# Patient Record
Sex: Male | Born: 1950
Health system: Southern US, Community
[De-identification: ages and names within clinical notes are randomized; demographics above are authoritative.]

## PROBLEM LIST (undated history)

## (undated) DIAGNOSIS — Z8619 Personal history of other infectious and parasitic diseases: Secondary | ICD-10-CM

## (undated) DIAGNOSIS — I739 Peripheral vascular disease, unspecified: Secondary | ICD-10-CM

## (undated) DIAGNOSIS — E669 Obesity, unspecified: Secondary | ICD-10-CM

## (undated) DIAGNOSIS — I10 Essential (primary) hypertension: Secondary | ICD-10-CM

## (undated) DIAGNOSIS — R7989 Other specified abnormal findings of blood chemistry: Secondary | ICD-10-CM

## (undated) DIAGNOSIS — M1990 Unspecified osteoarthritis, unspecified site: Secondary | ICD-10-CM

## (undated) HISTORY — DX: Other specified abnormal findings of blood chemistry: R79.89

## (undated) HISTORY — PX: FEMORAL-POPLITEAL BYPASS GRAFT: SHX937

## (undated) HISTORY — DX: Personal history of other infectious and parasitic diseases: Z86.19

## (undated) HISTORY — DX: Unspecified osteoarthritis, unspecified site: M19.90

## (undated) HISTORY — DX: Peripheral vascular disease, unspecified: I73.9

## (undated) HISTORY — PX: CIRCUMCISION: SUR203

## (undated) HISTORY — DX: Essential (primary) hypertension: I10

## (undated) HISTORY — PX: OTHER SURGICAL HISTORY: SHX169

---

## 2007-01-10 ENCOUNTER — Ambulatory Visit: Payer: Self-pay | Admitting: Gastroenterology

## 2007-09-02 ENCOUNTER — Ambulatory Visit: Payer: Self-pay | Admitting: Internal Medicine

## 2007-09-08 ENCOUNTER — Ambulatory Visit: Payer: Self-pay | Admitting: Oncology

## 2007-09-29 ENCOUNTER — Ambulatory Visit: Payer: Self-pay | Admitting: Oncology

## 2007-10-09 ENCOUNTER — Ambulatory Visit: Payer: Self-pay | Admitting: Oncology

## 2007-10-21 ENCOUNTER — Ambulatory Visit: Payer: Self-pay | Admitting: Rheumatology

## 2007-11-09 ENCOUNTER — Ambulatory Visit: Payer: Self-pay | Admitting: Oncology

## 2008-01-08 ENCOUNTER — Ambulatory Visit: Payer: Self-pay | Admitting: Oncology

## 2008-02-06 ENCOUNTER — Ambulatory Visit: Payer: Self-pay | Admitting: Oncology

## 2008-04-07 ENCOUNTER — Ambulatory Visit: Payer: Self-pay | Admitting: Oncology

## 2008-04-19 ENCOUNTER — Ambulatory Visit: Payer: Self-pay | Admitting: Oncology

## 2008-05-08 ENCOUNTER — Ambulatory Visit: Payer: Self-pay | Admitting: Oncology

## 2008-08-08 ENCOUNTER — Ambulatory Visit: Payer: Self-pay | Admitting: Oncology

## 2008-09-07 ENCOUNTER — Ambulatory Visit: Payer: Self-pay | Admitting: Oncology

## 2009-03-08 ENCOUNTER — Ambulatory Visit: Payer: Self-pay | Admitting: Oncology

## 2009-03-09 ENCOUNTER — Ambulatory Visit: Payer: Self-pay | Admitting: Oncology

## 2009-04-07 ENCOUNTER — Ambulatory Visit: Payer: Self-pay | Admitting: Oncology

## 2013-06-16 ENCOUNTER — Ambulatory Visit (INDEPENDENT_AMBULATORY_CARE_PROVIDER_SITE_OTHER): Payer: BC Managed Care – PPO | Admitting: Internal Medicine

## 2013-06-16 ENCOUNTER — Encounter: Payer: Self-pay | Admitting: Internal Medicine

## 2013-06-16 VITALS — BP 120/80 | HR 67 | Temp 98.1°F | Ht 72.5 in | Wt 265.8 lb

## 2013-06-16 DIAGNOSIS — E7800 Pure hypercholesterolemia, unspecified: Secondary | ICD-10-CM

## 2013-06-16 DIAGNOSIS — I739 Peripheral vascular disease, unspecified: Secondary | ICD-10-CM

## 2013-06-16 DIAGNOSIS — I10 Essential (primary) hypertension: Secondary | ICD-10-CM

## 2013-06-19 ENCOUNTER — Encounter: Payer: Self-pay | Admitting: Internal Medicine

## 2013-06-19 NOTE — Progress Notes (Signed)
  Subjective:    Patient ID: Charles Nichols, male    DOB: 07-Mar-1951, 62 y.o.   MRN: [id number]  HPI 62 year old male with past history of hypertension and peripheral vascular disease.  He comes in today to follow up on these issues as well as to transfer his care here to [facility].  Former pt of mine at [employer].  States he is doing relatively well.  His daughter recently passed away with reoccurring breast cancer.  He feels he is coping relatively well.  Blood pressure has been averaging 120-130s/70s.  No chest pain or tightness.  No sob.  No nausea or vomiting.  No bowels change.  No leg pain or cramping.     Past Medical History  Diagnosis Date  . Arthritis   . History of chicken pox   . Hypertension   . Peripheral vascular disease     Outpatient Encounter Prescriptions as of 06/16/2013  Medication Sig Dispense Refill  . acetaminophen (TYLENOL) 325 MG tablet Take 650 mg by mouth every 6 (six) hours as needed for pain.      Marland Kitchen aspirin 81 MG tablet Take 81 mg by mouth daily.      . Cholecalciferol (VITAMIN D3) 2000 UNITS TABS Take by mouth daily.      . felodipine (PLENDIL) 10 MG 24 hr tablet Take 10 mg by mouth daily.      . hydrochlorothiazide (HYDRODIURIL) 25 MG tablet Take 25 mg by mouth daily.      Marland Kitchen lisinopril (PRINIVIL,ZESTRIL) 20 MG tablet Take 20 mg by mouth daily.      Marland Kitchen lovastatin (MEVACOR) 40 MG tablet Take 40 mg by mouth at bedtime.       No facility-administered encounter medications on file as of 06/16/2013.    Review of Systems Patient denies any headache, lightheadedness or dizziness.  No sinus or allergy symptoms.  No chest pain, tightness or palpitations.  No increased shortness of breath, cough or congestion.  No nausea or vomiting.  No acid reflux.  No abdominal pain or cramping.  No bowel change, such as diarrhea, constipation, BRBPR or melana.  No urine change.  Overall coping relatively well with his daughter's death.        Objective:   Physical Exam Filed Vitals:   06/16/13 0948  BP: 120/80  Pulse: 67  Temp: 98.1 F (46.84 C)   62 year old male in no acute distress.  HEENT:  Nares - clear.  Oropharynx - without lesions. NECK:  Supple.  Nontender.  No audible carotid bruit.  HEART:  Appears to be regular.   LUNGS:  No crackles or wheezing audible.  Respirations even and unlabored.   RADIAL PULSE:  Equal bilaterally.  ABDOMEN:  Soft.  Nontender.  Bowel sounds present and normal.  No audible abdominal bruit.    EXTREMITIES:  No increased edema present.  Feet - warm.   SKIN:  No lesions.     Assessment & Plan:  HEALTH MAINTENANCE.  Schedule him for a physical next visit.  Check psa.  Obtain records for review.  Check colonoscopy.  (last 2007).    I spent 30 minutes with the patient and more than 50% of the time was spent in consultation regarding the above.

## 2013-06-20 ENCOUNTER — Encounter: Payer: Self-pay | Admitting: Internal Medicine

## 2013-06-20 DIAGNOSIS — I10 Essential (primary) hypertension: Secondary | ICD-10-CM | POA: Insufficient documentation

## 2013-06-20 DIAGNOSIS — I739 Peripheral vascular disease, unspecified: Secondary | ICD-10-CM | POA: Insufficient documentation

## 2013-06-20 DIAGNOSIS — E7800 Pure hypercholesterolemia, unspecified: Secondary | ICD-10-CM | POA: Insufficient documentation

## 2013-06-20 NOTE — Assessment & Plan Note (Signed)
S/p intervention.  Lets doing well.  Follow.

## 2013-06-20 NOTE — Assessment & Plan Note (Signed)
Low cholesterol diet and exercise.  On lovastatin.  Check lipid panel and liver function.

## 2013-06-20 NOTE — Assessment & Plan Note (Signed)
Blood pressure as outlined.  Same medication regimen.  Check metabolic panel.  

## 2013-08-04 ENCOUNTER — Encounter: Payer: Self-pay | Admitting: Internal Medicine

## 2013-09-15 ENCOUNTER — Encounter (INDEPENDENT_AMBULATORY_CARE_PROVIDER_SITE_OTHER): Payer: Self-pay

## 2013-09-15 ENCOUNTER — Ambulatory Visit (INDEPENDENT_AMBULATORY_CARE_PROVIDER_SITE_OTHER): Payer: BC Managed Care – PPO | Admitting: Internal Medicine

## 2013-09-15 ENCOUNTER — Encounter: Payer: Self-pay | Admitting: Internal Medicine

## 2013-09-15 VITALS — BP 130/80 | HR 65 | Temp 98.0°F | Ht 73.5 in | Wt 266.0 lb

## 2013-09-15 DIAGNOSIS — I739 Peripheral vascular disease, unspecified: Secondary | ICD-10-CM

## 2013-09-15 DIAGNOSIS — I10 Essential (primary) hypertension: Secondary | ICD-10-CM

## 2013-09-15 DIAGNOSIS — E7800 Pure hypercholesterolemia, unspecified: Secondary | ICD-10-CM

## 2013-09-15 DIAGNOSIS — Z1211 Encounter for screening for malignant neoplasm of colon: Secondary | ICD-10-CM

## 2013-09-15 MED ORDER — FELODIPINE ER 10 MG PO TB24: 10.0000 mg | ORAL_TABLET | Freq: Every day | ORAL | Status: DC

## 2013-09-15 MED ORDER — LISINOPRIL 20 MG PO TABS: 40.0000 mg | ORAL_TABLET | Freq: Every day | ORAL | Status: DC

## 2013-09-15 MED ORDER — LOVASTATIN 40 MG PO TABS: 40.0000 mg | ORAL_TABLET | Freq: Every day | ORAL | Status: DC

## 2013-09-15 MED ORDER — HYDROCHLOROTHIAZIDE 25 MG PO TABS: 25.0000 mg | ORAL_TABLET | Freq: Every day | ORAL | Status: DC

## 2013-09-15 NOTE — Progress Notes (Signed)
  Subjective:    Patient ID: Charles Nichols, male    DOB: Apr 14, 1951, 62 y.o.   MRN: [id number]  HPI 62 year old male with past history of hypertension and peripheral vascular disease.  He comes in today to follow up on these issues as well as for a complete physical exam.   States he is doing relatively well.  His daughter recently passed away with reoccurring breast cancer.  He feels he is coping relatively well.  Blood pressure has been doing well.  No chest pain or tightness.  No sob.  No nausea or vomiting.  No bowels change.  No leg pain or cramping.  Previously had a rash on his left elbow.  No swelling.  This has resolved.     Past Medical History  Diagnosis Date  . Arthritis   . History of chicken pox   . Hypertension   . Peripheral vascular disease     Outpatient Encounter Prescriptions as of 09/15/2013  Medication Sig  . acetaminophen (TYLENOL) 325 MG tablet Take 650 mg by mouth every 6 (six) hours as needed for pain.  Marland Kitchen aspirin 81 MG tablet Take 81 mg by mouth daily.  . Cholecalciferol (VITAMIN D3) 2000 UNITS TABS Take by mouth daily.  . felodipine (PLENDIL) 10 MG 24 hr tablet Take 10 mg by mouth daily.  . hydrochlorothiazide (HYDRODIURIL) 25 MG tablet Take 25 mg by mouth daily.  Marland Kitchen lisinopril (PRINIVIL,ZESTRIL) 20 MG tablet Take 20 mg by mouth daily.  Marland Kitchen lovastatin (MEVACOR) 40 MG tablet Take 40 mg by mouth at bedtime.    Review of Systems Patient denies any headache, lightheadedness or dizziness.  No sinus or allergy symptoms.  No chest pain, tightness or palpitations.  No increased shortness of breath, cough or congestion.  No nausea or vomiting.  No acid reflux.  No abdominal pain or cramping.  No bowel change, such as diarrhea, constipation, BRBPR or melana.  No urine change.  Overall coping relatively well with his daughter's death.        Objective:   Physical Exam  Filed Vitals:   09/15/13 1112  BP: 130/80  Pulse: 65  Temp: 98 F (60.16 C)   62 year old male in no  acute distress.  HEENT:  Nares - clear.  Oropharynx - without lesions. NECK:  Supple.  Nontender.  No audible carotid bruit.  HEART:  Appears to be regular.   LUNGS:  No crackles or wheezing audible.  Respirations even and unlabored.   RADIAL PULSE:  Equal bilaterally.  ABDOMEN:  Soft.  Nontender.  Bowel sounds present and normal.  No audible abdominal bruit.  GU:  Normal descended testicles.  No palpable testicular nodules.   RECTAL:  Could not appreciate any palpable prostate nodules.  Heme negative.   EXTREMITIES:  No increased edema present.         Assessment & Plan:  HEALTH MAINTENANCE.  Physical today.  Check colonoscopy.  (last 2007).   PSA 1.5 (03/31/13).

## 2013-09-15 NOTE — Progress Notes (Signed)
Pre-visit discussion using our clinic review tool. No additional management support is needed unless otherwise documented below in the visit note.  

## 2013-09-19 ENCOUNTER — Encounter: Payer: Self-pay | Admitting: Internal Medicine

## 2013-09-19 ENCOUNTER — Other Ambulatory Visit: Payer: Self-pay | Admitting: Internal Medicine

## 2013-09-19 NOTE — Assessment & Plan Note (Signed)
Low cholesterol diet and exercise.  On lovastatin.  Follow lipid panel and liver function.    

## 2013-09-19 NOTE — Progress Notes (Signed)
Opened in error

## 2013-09-19 NOTE — Assessment & Plan Note (Signed)
S/p intervention.  Legs doing well.  Follow.   

## 2013-09-19 NOTE — Assessment & Plan Note (Signed)
Blood pressure as outlined.  Same medication regimen.  Follow metabolic panel.  

## 2013-10-13 ENCOUNTER — Encounter: Payer: Self-pay | Admitting: *Deleted

## 2014-01-21 ENCOUNTER — Other Ambulatory Visit (INDEPENDENT_AMBULATORY_CARE_PROVIDER_SITE_OTHER): Payer: BC Managed Care – PPO

## 2014-01-21 DIAGNOSIS — Z1211 Encounter for screening for malignant neoplasm of colon: Secondary | ICD-10-CM

## 2014-01-21 LAB — FECAL OCCULT BLOOD, IMMUNOCHEMICAL: Fecal Occult Bld: NEGATIVE

## 2014-01-22 ENCOUNTER — Encounter: Payer: Self-pay | Admitting: Internal Medicine

## 2014-01-22 ENCOUNTER — Ambulatory Visit (INDEPENDENT_AMBULATORY_CARE_PROVIDER_SITE_OTHER): Payer: BC Managed Care – PPO | Admitting: Internal Medicine

## 2014-01-22 ENCOUNTER — Encounter: Payer: Self-pay | Admitting: *Deleted

## 2014-01-22 VITALS — BP 130/60 | HR 70 | Temp 98.5°F | Ht 73.5 in | Wt 245.5 lb

## 2014-01-22 DIAGNOSIS — I10 Essential (primary) hypertension: Secondary | ICD-10-CM

## 2014-01-22 DIAGNOSIS — I739 Peripheral vascular disease, unspecified: Secondary | ICD-10-CM

## 2014-01-22 DIAGNOSIS — E7800 Pure hypercholesterolemia, unspecified: Secondary | ICD-10-CM

## 2014-01-22 MED ORDER — MUPIROCIN CALCIUM 2 % EX CREA: 1.0000 "application " | TOPICAL_CREAM | Freq: Two times a day (BID) | CUTANEOUS | Status: DC

## 2014-01-22 NOTE — Progress Notes (Signed)
Pre visit review using our clinic review tool, if applicable. No additional management support is needed unless otherwise documented below in the visit note. 

## 2014-01-26 ENCOUNTER — Encounter: Payer: Self-pay | Admitting: Internal Medicine

## 2014-01-26 NOTE — Assessment & Plan Note (Signed)
S/p intervention.  Legs doing well.  Follow.   

## 2014-01-26 NOTE — Assessment & Plan Note (Signed)
Blood pressure as outlined.  Same medication regimen.  Follow metabolic panel.  

## 2014-01-26 NOTE — Assessment & Plan Note (Signed)
Low cholesterol diet and exercise.  On lovastatin.  Follow lipid panel and liver function.    

## 2014-01-26 NOTE — Progress Notes (Signed)
  Subjective:    Patient ID: Charles Nichols, male    DOB: [birth year], 63 y.o.   MRN: [id number]  HPI 63 year old male with past history of hypertension and peripheral vascular disease.  He comes in today for a scheduled follow up.   States he is doing well.  Has adjusted his diet.  Is exercising.  Has lost weight.   Blood pressure has been doing well.  No chest pain or tightness.  No sob.  No nausea or vomiting.  No bowels change.  No leg pain or cramping.     Past Medical History  Diagnosis Date  . Arthritis   . History of chicken pox   . Hypertension   . Peripheral vascular disease     Outpatient Encounter Prescriptions as of 01/22/2014  Medication Sig  . acetaminophen (TYLENOL) 325 MG tablet Take 650 mg by mouth every 6 (six) hours as needed for pain.  Marland Kitchen aspirin 81 MG tablet Take 81 mg by mouth daily.  . Cholecalciferol (VITAMIN D3) 2000 UNITS TABS Take by mouth daily.  . felodipine (PLENDIL) 10 MG 24 hr tablet Take 1 tablet (10 mg total) by mouth daily.  . hydrochlorothiazide (HYDRODIURIL) 25 MG tablet Take 1 tablet (25 mg total) by mouth daily.  Marland Kitchen lisinopril (PRINIVIL,ZESTRIL) 20 MG tablet Take 2 tablets (40 mg total) by mouth daily.  Marland Kitchen lovastatin (MEVACOR) 40 MG tablet Take 1 tablet (40 mg total) by mouth at bedtime.  . mupirocin cream (BACTROBAN) 2 % Apply 1 application topically 2 (two) times daily.    Review of Systems Patient denies any headache, lightheadedness or dizziness.  No sinus or allergy symptoms.  No chest pain, tightness or palpitations.  No increased shortness of breath, cough or congestion.  No nausea or vomiting.  No acid reflux.  No abdominal pain or cramping.  No bowel change, such as diarrhea, constipation, BRBPR or melana.  No urine change.        Objective:   Physical Exam  Filed Vitals:   01/22/14 1619  BP: 130/60  Pulse: 70  Temp: 98.5 F (36.9 C)   Blood pressure recheck:  128/72, pulse 3  63 year old male in no acute distress.  HEENT:  Nares -  clear.  Oropharynx - without lesions. NECK:  Supple.  Nontender.  No audible carotid bruit.  HEART:  Appears to be regular.   LUNGS:  No crackles or wheezing audible.  Respirations even and unlabored.   RADIAL PULSE:  Equal bilaterally.  ABDOMEN:  Soft.  Nontender.  Bowel sounds present and normal.  No audible abdominal bruit.   EXTREMITIES:  No increased edema present.         Assessment & Plan:  HEALTH MAINTENANCE.  Physical 09/15/13.  Colonoscopy.  (last 2007).   PSA 1.5 (03/31/13).

## 2014-05-14 ENCOUNTER — Other Ambulatory Visit: Payer: Self-pay | Admitting: *Deleted

## 2014-05-14 ENCOUNTER — Other Ambulatory Visit: Payer: Self-pay | Admitting: Internal Medicine

## 2014-05-14 MED ORDER — FELODIPINE ER 10 MG PO TB24: 10.0000 mg | ORAL_TABLET | Freq: Every day | ORAL | Status: DC

## 2014-05-31 ENCOUNTER — Ambulatory Visit (INDEPENDENT_AMBULATORY_CARE_PROVIDER_SITE_OTHER): Payer: BC Managed Care – PPO | Admitting: Internal Medicine

## 2014-05-31 ENCOUNTER — Encounter: Payer: Self-pay | Admitting: Internal Medicine

## 2014-05-31 VITALS — BP 130/80 | HR 67 | Temp 98.6°F | Ht 73.5 in | Wt 238.2 lb

## 2014-05-31 DIAGNOSIS — I739 Peripheral vascular disease, unspecified: Secondary | ICD-10-CM

## 2014-05-31 DIAGNOSIS — I10 Essential (primary) hypertension: Secondary | ICD-10-CM

## 2014-05-31 DIAGNOSIS — R1030 Lower abdominal pain, unspecified: Secondary | ICD-10-CM

## 2014-05-31 DIAGNOSIS — R109 Unspecified abdominal pain: Secondary | ICD-10-CM

## 2014-05-31 DIAGNOSIS — E7800 Pure hypercholesterolemia, unspecified: Secondary | ICD-10-CM

## 2014-05-31 LAB — CBC AND DIFFERENTIAL
HEMATOCRIT: 37 % — AB (ref 41–53)
Hemoglobin: 12.6 g/dL — AB (ref 13.5–17.5)
PLATELETS: 205 10*3/uL (ref 150–399)
WBC: 4.1 10^3/mL

## 2014-05-31 LAB — BASIC METABOLIC PANEL
BUN: 16 mg/dL (ref 4–21)
Creatinine: 0.9 mg/dL (ref 0.6–1.3)
GLUCOSE: 92 mg/dL
Potassium: 4 mmol/L (ref 3.4–5.3)
Sodium: 138 mmol/L (ref 137–147)

## 2014-05-31 LAB — HEPATIC FUNCTION PANEL
ALK PHOS: 55 U/L (ref 25–125)
ALT: 13 U/L (ref 10–40)
AST: 18 U/L (ref 14–40)
Bilirubin, Total: 0.4 mg/dL

## 2014-05-31 LAB — LIPID PANEL
Cholesterol: 132 mg/dL (ref 0–200)
HDL: 66 mg/dL (ref 35–70)
LDL Cholesterol: 57 mg/dL
LDl/HDL Ratio: 2
Triglycerides: 47 mg/dL (ref 40–160)

## 2014-05-31 LAB — TSH: TSH: 2.37 u[IU]/mL (ref 0.41–5.90)

## 2014-05-31 LAB — HEMOGLOBIN A1C: HEMOGLOBIN A1C: 5.7 % (ref 4.0–6.0)

## 2014-05-31 NOTE — Progress Notes (Signed)
Pre visit review using our clinic review tool, if applicable. No additional management support is needed unless otherwise documented below in the visit note. 

## 2014-06-01 ENCOUNTER — Other Ambulatory Visit: Payer: Self-pay | Admitting: Internal Medicine

## 2014-06-02 ENCOUNTER — Encounter: Payer: Self-pay | Admitting: Internal Medicine

## 2014-06-02 DIAGNOSIS — R1030 Lower abdominal pain, unspecified: Secondary | ICD-10-CM | POA: Insufficient documentation

## 2014-06-02 NOTE — Assessment & Plan Note (Signed)
Blood pressure as outlined.  Same medication regimen.  Follow metabolic panel.  

## 2014-06-02 NOTE — Assessment & Plan Note (Signed)
Intermittent pain as outlined.  No injury or trauma.  Stretches as discussed.  No leg pain.  He desires no further w/up or testing at this time.  Follow.

## 2014-06-02 NOTE — Assessment & Plan Note (Signed)
S/p intervention.  Legs doing well.  Follow.

## 2014-06-02 NOTE — Progress Notes (Signed)
  Subjective:    Patient ID: Charles Nichols, male    DOB: 04/21/1951, 63 y.o.   MRN: [id number]  HPI 63 year old male with past history of hypertension and peripheral vascular disease.  He comes in today for a scheduled follow up.   States he is doing well.  Has adjusted his diet.  Is still exercising.  Has lost more weight.   Blood pressure has been doing well.  No chest pain or tightness.  No sob.  No nausea or vomiting.  No bowel change.  No leg pain or cramping.  He has noticed a "twinge" in his left groin.  Just occurs occasionally.  Does not limit him in his activity.  May occur 2x/day and does not last long.  Brief.  No pain in the leg.  No "bulge".     Past Medical History  Diagnosis Date  . Arthritis   . History of chicken pox   . Hypertension   . Peripheral vascular disease     Outpatient Encounter Prescriptions as of 05/31/2014  Medication Sig  . acetaminophen (TYLENOL) 325 MG tablet Take 650 mg by mouth every 6 (six) hours as needed for pain.  Marland Kitchen aspirin 81 MG tablet Take 81 mg by mouth daily.  . Cholecalciferol (VITAMIN D3) 2000 UNITS TABS Take by mouth daily.  . felodipine (PLENDIL) 10 MG 24 hr tablet Take 1 tablet (10 mg total) by mouth daily.  . hydrochlorothiazide (HYDRODIURIL) 25 MG tablet TAKE ONE TABLET BY MOUTH ONCE DAILY  . lisinopril (PRINIVIL,ZESTRIL) 20 MG tablet TAKE TWO TABLETS BY MOUTH ONCE DAILY  . lovastatin (MEVACOR) 40 MG tablet TAKE ONE TABLET BY MOUTH AT BEDTIME  . mupirocin cream (BACTROBAN) 2 % Apply 1 application topically 2 (two) times daily.    Review of Systems Patient denies any headache, lightheadedness or dizziness.  No sinus or allergy symptoms.  No chest pain, tightness or palpitations.  No increased shortness of breath, cough or congestion.  No nausea or vomiting.  No acid reflux.  No abdominal pain or cramping.  No bowel change, such as diarrhea, constipation, BRBPR or melana.  No urine change.  Does describe the twinge in his groin.  See above.  No  known injury or trauma.  No leg pain or cramping.        Objective:   Physical Exam  Filed Vitals:   05/31/14 1621  BP: 130/80  Pulse: 67  Temp: 98.6 F (33 C)   63 year old male in no acute distress.  HEENT:  Nares - clear.  Oropharynx - without lesions. NECK:  Supple.  Nontender.  No audible carotid bruit.  HEART:  Appears to be regular.   LUNGS:  No crackles or wheezing audible.  Respirations even and unlabored.   RADIAL PULSE:  Equal bilaterally.  ABDOMEN:  Soft.  Nontender.  Bowel sounds present and normal.  No audible abdominal bruit.   EXTREMITIES:  No increased edema present.   No pain to palpation in the groin/upper thigh/ inguinal area.  No lymphadenopathy appreciated.  No pain with straight leg raise.         Assessment & Plan:  HEALTH MAINTENANCE.  Physical 09/15/13.  Colonoscopy.  (last 2007).   PSA just checked in June and wnl.  Will follow.

## 2014-06-02 NOTE — Assessment & Plan Note (Signed)
Low cholesterol diet and exercise.  On lovastatin.  Follow lipid panel and liver function.

## 2014-06-28 LAB — HEPATIC FUNCTION PANEL
ALT: 18 U/L (ref 10–40)
AST: 21 U/L (ref 14–40)
Alkaline Phosphatase: 61 U/L (ref 25–125)
Bilirubin, Direct: 0.09 mg/dL (ref 0.01–0.4)
Bilirubin, Total: 0.2 mg/dL

## 2014-06-28 LAB — BASIC METABOLIC PANEL
BUN: 14 mg/dL (ref 4–21)
Creatinine: 0.8 mg/dL (ref 0.6–1.3)
Glucose: 85 mg/dL
Potassium: 4.2 mmol/L (ref 3.4–5.3)
Sodium: 140 mmol/L (ref 137–147)

## 2014-06-28 LAB — LIPID PANEL
CHOLESTEROL: 200 mg/dL (ref 0–200)
HDL: 107 mg/dL — AB (ref 35–70)
LDL CALC: 79 mg/dL
LDL/HDL RATIO: 0.7
TRIGLYCERIDES: 71 mg/dL (ref 40–160)

## 2014-06-28 LAB — TSH: TSH: 5.15 u[IU]/mL (ref 0.41–5.90)

## 2014-06-29 ENCOUNTER — Encounter: Payer: Self-pay | Admitting: Internal Medicine

## 2014-07-11 ENCOUNTER — Telehealth: Payer: Self-pay | Admitting: Internal Medicine

## 2014-07-11 NOTE — Telephone Encounter (Signed)
Notify pt labs - cholesterol looks good.  Kidney function and liver function tests wnl.  tsh slightly increased.  Needs repeat tsh in 4 weeks.  Gets labs at [facility].  Will need [facility] order either mailed to him or faxed.  Will need to clarify with him.

## 2014-07-12 ENCOUNTER — Encounter: Payer: Self-pay | Admitting: *Deleted

## 2014-07-12 NOTE — Telephone Encounter (Signed)
Letter mailed & labslip enclosed

## 2014-08-20 ENCOUNTER — Encounter: Payer: Self-pay | Admitting: Internal Medicine

## 2014-08-20 LAB — TSH: TSH: 2.22 u[IU]/mL (ref 0.41–5.90)

## 2014-09-05 ENCOUNTER — Telehealth: Payer: Self-pay | Admitting: Internal Medicine

## 2014-09-05 NOTE — Telephone Encounter (Signed)
Notify pt that the follow up thyroid test is wnl.  Thanks.

## 2014-09-06 NOTE — Telephone Encounter (Signed)
Left message to return call 

## 2014-09-07 ENCOUNTER — Encounter: Payer: Self-pay | Admitting: *Deleted

## 2014-09-07 NOTE — Telephone Encounter (Signed)
No answer letter sent

## 2014-09-20 ENCOUNTER — Encounter: Payer: Self-pay | Admitting: Internal Medicine

## 2014-09-20 ENCOUNTER — Telehealth: Payer: Self-pay | Admitting: Internal Medicine

## 2014-09-20 NOTE — Telephone Encounter (Signed)
New appt date/time and letter mailed to pt/msn

## 2014-09-27 ENCOUNTER — Other Ambulatory Visit: Payer: Self-pay | Admitting: Internal Medicine

## 2014-10-04 ENCOUNTER — Encounter: Payer: BC Managed Care – PPO | Admitting: Internal Medicine

## 2014-11-19 ENCOUNTER — Encounter: Payer: Self-pay | Admitting: Internal Medicine

## 2014-11-19 ENCOUNTER — Ambulatory Visit (INDEPENDENT_AMBULATORY_CARE_PROVIDER_SITE_OTHER): Payer: BLUE CROSS/BLUE SHIELD | Admitting: Internal Medicine

## 2014-11-19 VITALS — BP 128/74 | HR 74 | Temp 98.0°F | Ht 72.5 in | Wt 254.5 lb

## 2014-11-19 DIAGNOSIS — Z125 Encounter for screening for malignant neoplasm of prostate: Secondary | ICD-10-CM

## 2014-11-19 DIAGNOSIS — Z0000 Encounter for general adult medical examination without abnormal findings: Secondary | ICD-10-CM

## 2014-11-19 DIAGNOSIS — I739 Peripheral vascular disease, unspecified: Secondary | ICD-10-CM

## 2014-11-19 DIAGNOSIS — I10 Essential (primary) hypertension: Secondary | ICD-10-CM

## 2014-11-19 DIAGNOSIS — E7800 Pure hypercholesterolemia, unspecified: Secondary | ICD-10-CM

## 2014-11-19 DIAGNOSIS — Z1211 Encounter for screening for malignant neoplasm of colon: Secondary | ICD-10-CM

## 2014-11-19 DIAGNOSIS — D649 Anemia, unspecified: Secondary | ICD-10-CM

## 2014-11-19 MED ORDER — HYDROCHLOROTHIAZIDE 25 MG PO TABS: 25.0000 mg | ORAL_TABLET | Freq: Every day | ORAL | Status: DC

## 2014-11-19 MED ORDER — LISINOPRIL 20 MG PO TABS: 40.0000 mg | ORAL_TABLET | Freq: Every day | ORAL | Status: DC

## 2014-11-19 MED ORDER — FELODIPINE ER 10 MG PO TB24: 10.0000 mg | ORAL_TABLET | Freq: Every day | ORAL | Status: DC

## 2014-11-19 NOTE — Progress Notes (Signed)
Patient ID: Charles Nichols, male   DOB: 04-22-1951, 64 y.o.   MRN: [id number]   Subjective:    Patient ID: Charles Nichols, male    DOB: Jun 27, 1951, 64 y.o.   MRN: [id number]  HPI  Patient here for his physical exam.  Has a history of hypertension, PVD and hypercholesterolemia.  States he is doing relatively well.  Increased stress with his daughter's passing.  States holidays are hard.  Feels he is handling things well.  No cardiac symptoms with increased activity or exertion.  Breathing stable.  Bowels stable.  No leg pain with walking.     Past Medical History  Diagnosis Date  . Arthritis   . History of chicken pox   . Hypertension   . Peripheral vascular disease     Current Outpatient Prescriptions on File Prior to Visit  Medication Sig Dispense Refill  . acetaminophen (TYLENOL) 325 MG tablet Take 650 mg by mouth every 6 (six) hours as needed for pain.    Marland Kitchen aspirin 81 MG tablet Take 81 mg by mouth daily.    . Cholecalciferol (VITAMIN D3) 2000 UNITS TABS Take by mouth daily.    Marland Kitchen lovastatin (MEVACOR) 40 MG tablet TAKE ONE TABLET BY MOUTH AT BEDTIME 30 tablet 5  . mupirocin cream (BACTROBAN) 2 % Apply 1 application topically 2 (two) times daily. 15 g 0   No current facility-administered medications on file prior to visit.    Review of Systems  Constitutional: Negative for appetite change and unexpected weight change.  HENT: Negative for congestion, sinus pressure and sore throat.   Eyes: Negative for pain and visual disturbance.  Respiratory: Negative for cough, chest tightness and shortness of breath.   Cardiovascular: Negative for chest pain, palpitations and leg swelling.  Gastrointestinal: Negative for nausea, vomiting, abdominal pain, diarrhea and constipation.  Genitourinary: Negative for frequency and difficulty urinating.  Musculoskeletal: Negative for back pain and joint swelling.  Skin: Negative for rash and wound.  Neurological: Negative for dizziness and headaches.    Hematological: Negative for adenopathy. Does not bruise/bleed easily.  Psychiatric/Behavioral: Negative for decreased concentration and agitation.       Handling stress relatively well.  See above.         Objective:     Blood pressure recheck:  124/72  Physical Exam  Constitutional: He is oriented to person, place, and time. He appears well-developed and well-nourished. No distress.  HENT:  Head: Normocephalic and atraumatic.  Nose: Nose normal.  Mouth/Throat: Oropharynx is clear and moist. No oropharyngeal exudate.  Eyes: Conjunctivae are normal. Right eye exhibits no discharge. Left eye exhibits no discharge.  Neck: Neck supple. No thyromegaly present.  Cardiovascular: Normal rate and regular rhythm.   Pulmonary/Chest: Breath sounds normal. No respiratory distress. He has no wheezes.  Abdominal: Soft. Bowel sounds are normal. There is no tenderness.  Genitourinary:  Normal descended testicle.  No nodules appreciated.  Rectal exam performed.  No prostate nodules appreciated.  Heme negative.    Musculoskeletal: He exhibits no edema or tenderness.  Lymphadenopathy:    He has no cervical adenopathy.  Neurological: He is alert and oriented to person, place, and time.  Skin: Skin is warm and dry. No rash noted.  Psychiatric: He has a normal mood and affect. His behavior is normal.    BP 128/74 mmHg  Pulse 74  Temp(Src) 98 F (36.7 C) (Oral)  Ht 6' 0.5" (1.842 m)  Wt 254 lb 8 oz (115.44 kg)  BMI 34.02  kg/m2  SpO2 98% Wt Readings from Last 3 Encounters:  11/19/14 254 lb 8 oz (115.44 kg)  05/31/14 238 lb 4 oz (108.069 kg)  01/22/14 245 lb 8 oz (111.358 kg)     Lab Results  Component Value Date   WBC 4.1 05/31/2014   HGB 12.6* 05/31/2014   HCT 37* 05/31/2014   PLT 205 05/31/2014   CHOL 200 06/28/2014   TRIG 71 06/28/2014   HDL 107* 06/28/2014   LDLCALC 79 06/28/2014   ALT 18 06/28/2014   AST 21 06/28/2014   NA 140 06/28/2014   K 4.2 06/28/2014   CREATININE 0.8  06/28/2014   BUN 14 06/28/2014   TSH 2.22 08/20/2014   HGBA1C 5.7 05/31/2014       Assessment & Plan:   Problem List Items Addressed This Visit    Anemia    Previous decreased hgb.  Check cbc, ferritin and B12.        Relevant Orders   CBC with Differential/Platelet   Ferritin   Vitamin B12   Essential hypertension, benign - Primary    Blood pressure as outlined.  Appears to be doing well.  Same medication regimen.  Check metabolic panel.        Relevant Medications   felodipine (PLENDIL) 24 hr tablet   hydrochlorothiazide tablet   lisinopril (PRINIVIL,ZESTRIL) tablet   Other Relevant Orders   Basic metabolic panel   Health care maintenance    Physical today 11/22/14.  Colonoscopy 2007.  IFOB given.  Check PSA.      Hypercholesterolemia    Low cholesterol diet and exercise.  On mevacor.  Check lipid panel and liver function tests.        Relevant Medications   felodipine (PLENDIL) 24 hr tablet   hydrochlorothiazide tablet   lisinopril (PRINIVIL,ZESTRIL) tablet   Other Relevant Orders   Hepatic function panel   Lipid panel   Peripheral vascular disease    S/p intervention.  Legs doing well.  No pain with ambulation.  Follow.        Relevant Medications   felodipine (PLENDIL) 24 hr tablet   hydrochlorothiazide tablet   lisinopril (PRINIVIL,ZESTRIL) tablet    Other Visit Diagnoses    Colon cancer screening        Relevant Orders    Fecal occult blood, imunochemical    Prostate cancer screening        Relevant Orders    PSA      I spent 25 minutes with the patient and more than 50% of the time was spent in consultation regarding the above.     Einar Pheasant, MD

## 2014-11-19 NOTE — Progress Notes (Signed)
Pre visit review using our clinic review tool, if applicable. No additional management support is needed unless otherwise documented below in the visit note. 

## 2014-11-22 ENCOUNTER — Encounter: Payer: Self-pay | Admitting: Internal Medicine

## 2014-11-22 DIAGNOSIS — D649 Anemia, unspecified: Secondary | ICD-10-CM | POA: Insufficient documentation

## 2014-11-22 DIAGNOSIS — Z0000 Encounter for general adult medical examination without abnormal findings: Secondary | ICD-10-CM | POA: Insufficient documentation

## 2014-11-22 NOTE — Assessment & Plan Note (Signed)
S/p intervention.  Legs doing well.  No pain with ambulation.  Follow.

## 2014-11-22 NOTE — Assessment & Plan Note (Signed)
Physical today 11/22/14.  Colonoscopy 2007.  IFOB given.  Check PSA.

## 2014-11-22 NOTE — Assessment & Plan Note (Signed)
Blood pressure as outlined.  Appears to be doing well.  Same medication regimen.  Check metabolic panel.

## 2014-11-22 NOTE — Assessment & Plan Note (Signed)
Previous decreased hgb.  Check cbc, ferritin and B12.

## 2014-11-22 NOTE — Assessment & Plan Note (Signed)
Low cholesterol diet and exercise.  On mevacor.  Check lipid panel and liver function tests.

## 2014-11-29 LAB — BASIC METABOLIC PANEL
BUN: 12 mg/dL (ref 4–21)
CREATININE: 0.7 mg/dL (ref 0.6–1.3)
Glucose: 97 mg/dL
Potassium: 3.8 mmol/L (ref 3.4–5.3)
Sodium: 140 mmol/L (ref 137–147)

## 2014-11-29 LAB — HEPATIC FUNCTION PANEL
ALK PHOS: 56 U/L (ref 25–125)
ALT: 18 U/L (ref 10–40)
AST: 25 U/L (ref 14–40)
BILIRUBIN, TOTAL: 0.3 mg/dL

## 2014-11-29 LAB — CBC AND DIFFERENTIAL
HEMATOCRIT: 36 % — AB (ref 41–53)
Hemoglobin: 12.3 g/dL — AB (ref 13.5–17.5)
NEUTROS ABS: 3 /uL
Platelets: 178 10*3/uL (ref 150–399)
WBC: 5.1 10^3/mL

## 2014-11-29 LAB — LIPID PANEL
CHOLESTEROL: 184 mg/dL (ref 0–200)
HDL: 66 mg/dL (ref 35–70)
LDL Cholesterol: 92 mg/dL
TRIGLYCERIDES: 131 mg/dL (ref 40–160)

## 2014-11-29 LAB — TSH: TSH: 5.7 u[IU]/mL (ref 0.41–5.90)

## 2014-11-29 LAB — HEMOGLOBIN A1C: Hgb A1c MFr Bld: 5.8 % (ref 4.0–6.0)

## 2014-12-03 ENCOUNTER — Other Ambulatory Visit (INDEPENDENT_AMBULATORY_CARE_PROVIDER_SITE_OTHER): Payer: BLUE CROSS/BLUE SHIELD

## 2014-12-03 DIAGNOSIS — Z1211 Encounter for screening for malignant neoplasm of colon: Secondary | ICD-10-CM

## 2014-12-03 LAB — FECAL OCCULT BLOOD, IMMUNOCHEMICAL: Fecal Occult Bld: POSITIVE — AB

## 2014-12-09 ENCOUNTER — Other Ambulatory Visit: Payer: Self-pay | Admitting: Internal Medicine

## 2014-12-09 DIAGNOSIS — D649 Anemia, unspecified: Secondary | ICD-10-CM

## 2014-12-09 DIAGNOSIS — R195 Other fecal abnormalities: Secondary | ICD-10-CM

## 2014-12-09 NOTE — Progress Notes (Signed)
Order placed for GI referral.   

## 2015-01-01 ENCOUNTER — Telehealth: Payer: Self-pay | Admitting: Internal Medicine

## 2015-01-01 NOTE — Telephone Encounter (Signed)
Please notify pt that I reviewed his last labs.  His cholesterol looks good.  Kidney function tests and liver function tests are wnl.  Hgb is stable.  Vitamin D level is wnl.  Overall sugar control - ok.  TSH is slightly increased.  We need to recheck his tsh.  He can either pick up the [employer] order for we can mail, whichever he prefers.  I will complete the order.

## 2015-01-05 ENCOUNTER — Telehealth: Payer: Self-pay | Admitting: *Deleted

## 2015-01-05 DIAGNOSIS — M79672 Pain in left foot: Secondary | ICD-10-CM

## 2015-01-05 NOTE — Telephone Encounter (Signed)
Pt notified of results & would like [facility] order mailed. (form placed in green folder)

## 2015-01-05 NOTE — Telephone Encounter (Signed)
Left message for pt to return call.

## 2015-01-05 NOTE — Telephone Encounter (Signed)
Will sign and place in your box when back at office.

## 2015-01-05 NOTE — Telephone Encounter (Signed)
Pt has been trying to get a referral to see dr. Caryl Comes (foot doctor) prefers afternoon for left foot pain (h/o arthritis).

## 2015-01-05 NOTE — Telephone Encounter (Signed)
Order placed for referral to podiatry.   

## 2015-01-24 LAB — TSH: TSH: 4.63 u[IU]/mL (ref <=5.90)

## 2015-01-25 ENCOUNTER — Encounter: Payer: Self-pay | Admitting: *Deleted

## 2015-02-03 ENCOUNTER — Encounter: Payer: Self-pay | Admitting: *Deleted

## 2015-03-03 ENCOUNTER — Other Ambulatory Visit: Payer: Self-pay | Admitting: *Deleted

## 2015-03-03 MED ORDER — LOVASTATIN 40 MG PO TABS: 40.0000 mg | ORAL_TABLET | Freq: Every day | ORAL | Status: DC

## 2015-04-07 ENCOUNTER — Encounter: Payer: Self-pay | Admitting: *Deleted

## 2015-04-08 ENCOUNTER — Ambulatory Visit: Payer: BLUE CROSS/BLUE SHIELD | Admitting: *Deleted

## 2015-04-08 ENCOUNTER — Encounter
Admission: RE | Disposition: A | Discharge status: Home / Self Care | Payer: Self-pay | Source: Ambulatory Visit | Attending: Gastroenterology

## 2015-04-08 ENCOUNTER — Encounter: Payer: Self-pay | Admitting: Anesthesiology

## 2015-04-08 ENCOUNTER — Ambulatory Visit
Admission: RE | Admit: 2015-04-08 | Discharge: 2015-04-08 | Disposition: A | Discharge status: Home / Self Care | Payer: BLUE CROSS/BLUE SHIELD | Source: Ambulatory Visit | Attending: Gastroenterology | Admitting: Gastroenterology

## 2015-04-08 DIAGNOSIS — K5730 Diverticulosis of large intestine without perforation or abscess without bleeding: Secondary | ICD-10-CM | POA: Insufficient documentation

## 2015-04-08 DIAGNOSIS — E669 Obesity, unspecified: Secondary | ICD-10-CM | POA: Insufficient documentation

## 2015-04-08 DIAGNOSIS — Z6832 Body mass index (BMI) 32.0-32.9, adult: Secondary | ICD-10-CM | POA: Insufficient documentation

## 2015-04-08 DIAGNOSIS — K921 Melena: Secondary | ICD-10-CM | POA: Insufficient documentation

## 2015-04-08 DIAGNOSIS — K210 Gastro-esophageal reflux disease with esophagitis: Secondary | ICD-10-CM | POA: Insufficient documentation

## 2015-04-08 DIAGNOSIS — Z7982 Long term (current) use of aspirin: Secondary | ICD-10-CM | POA: Diagnosis not present

## 2015-04-08 DIAGNOSIS — D120 Benign neoplasm of cecum: Secondary | ICD-10-CM | POA: Insufficient documentation

## 2015-04-08 DIAGNOSIS — Z87891 Personal history of nicotine dependence: Secondary | ICD-10-CM | POA: Insufficient documentation

## 2015-04-08 DIAGNOSIS — D500 Iron deficiency anemia secondary to blood loss (chronic): Secondary | ICD-10-CM

## 2015-04-08 DIAGNOSIS — I739 Peripheral vascular disease, unspecified: Secondary | ICD-10-CM | POA: Diagnosis not present

## 2015-04-08 DIAGNOSIS — D649 Anemia, unspecified: Secondary | ICD-10-CM | POA: Insufficient documentation

## 2015-04-08 DIAGNOSIS — Z79899 Other long term (current) drug therapy: Secondary | ICD-10-CM | POA: Insufficient documentation

## 2015-04-08 DIAGNOSIS — K2900 Acute gastritis without bleeding: Secondary | ICD-10-CM | POA: Diagnosis not present

## 2015-04-08 DIAGNOSIS — M1990 Unspecified osteoarthritis, unspecified site: Secondary | ICD-10-CM | POA: Diagnosis not present

## 2015-04-08 DIAGNOSIS — K2950 Unspecified chronic gastritis without bleeding: Secondary | ICD-10-CM | POA: Insufficient documentation

## 2015-04-08 DIAGNOSIS — I10 Essential (primary) hypertension: Secondary | ICD-10-CM | POA: Diagnosis not present

## 2015-04-08 HISTORY — PX: COLONOSCOPY WITH PROPOFOL: SHX5780

## 2015-04-08 HISTORY — DX: Obesity, unspecified: E66.9

## 2015-04-08 HISTORY — PX: ESOPHAGOGASTRODUODENOSCOPY: SHX5428

## 2015-04-08 LAB — CBC WITH DIFFERENTIAL/PLATELET
Basophils Absolute: 0 10*3/uL (ref 0–0.1)
Basophils Relative: 0 %
Eosinophils Absolute: 0.2 10*3/uL (ref 0–0.7)
Eosinophils Relative: 3 %
HEMATOCRIT: 42.1 % (ref 40.0–52.0)
HEMOGLOBIN: 13.8 g/dL (ref 13.0–18.0)
LYMPHS PCT: 20 %
Lymphs Abs: 1.4 10*3/uL (ref 1.0–3.6)
MCH: 28.1 pg (ref 26.0–34.0)
MCHC: 32.8 g/dL (ref 32.0–36.0)
MCV: 85.6 fL (ref 80.0–100.0)
Monocytes Absolute: 0.7 10*3/uL (ref 0.2–1.0)
Monocytes Relative: 10 %
NEUTROS ABS: 4.5 10*3/uL (ref 1.4–6.5)
Neutrophils Relative %: 67 %
Platelets: 179 10*3/uL (ref 150–440)
RBC: 4.92 MIL/uL (ref 4.40–5.90)
RDW: 13.3 % (ref 11.5–14.5)
WBC: 6.8 10*3/uL (ref 3.8–10.6)

## 2015-04-08 LAB — IRON AND TIBC
Iron: 66 ug/dL (ref 45–182)
SATURATION RATIOS: 21 % (ref 17.9–39.5)
TIBC: 309 ug/dL (ref 250–450)
UIBC: 243 ug/dL

## 2015-04-08 SURGERY — COLONOSCOPY WITH PROPOFOL
Anesthesia: General

## 2015-04-08 MED ORDER — PROPOFOL 10 MG/ML IV BOLUS
INTRAVENOUS | Status: DC | PRN
Start: 2015-04-08 — End: 2015-04-08
  Administered 2015-04-08: 700 mg via INTRAVENOUS

## 2015-04-08 MED ORDER — ONDANSETRON HCL 4 MG/2ML IJ SOLN
INTRAMUSCULAR | Status: DC | PRN
Start: 2015-04-08 — End: 2015-04-08
  Administered 2015-04-08: 4 mg via INTRAVENOUS

## 2015-04-08 MED ORDER — ONDANSETRON HCL 4 MG/2ML IJ SOLN: 4.0000 mg | Freq: Once | INTRAMUSCULAR | Status: AC | PRN

## 2015-04-08 MED ORDER — SODIUM CHLORIDE 0.9 % IV SOLN
INTRAVENOUS | Status: DC
  Administered 2015-04-08: 1000 mL via INTRAVENOUS

## 2015-04-08 MED ORDER — FENTANYL CITRATE (PF) 100 MCG/2ML IJ SOLN: 25.0000 ug | INTRAMUSCULAR | Status: DC | PRN

## 2015-04-08 MED ORDER — EPHEDRINE SULFATE 50 MG/ML IJ SOLN
INTRAMUSCULAR | Status: DC | PRN
  Administered 2015-04-08: 10 mg via INTRAVENOUS

## 2015-04-08 MED ORDER — SODIUM CHLORIDE 0.9 % IV SOLN: INTRAVENOUS | Status: DC

## 2015-04-08 NOTE — H&P (Signed)
Outpatient short stay form Pre-procedure 04/08/2015 7:58 AM Lollie Sails MD  Primary Physician: Einar Pheasant  Reason for visit:  EGD and colonoscopy  History of present illness:  She is a 64 year old male presenting with a recent history of finding of Hemoccult-positive. He does have a personal history of adenomatous colon polyps from 2003.    Current facility-administered medications:  .  0.9 %  sodium chloride infusion, , Intravenous, Continuous, Lollie Sails, MD, Last Rate: 10 mL/hr at 04/08/15 0735, 1,000 mL at 04/08/15 0735 .  0.9 %  sodium chloride infusion, , Intravenous, Continuous, Lollie Sails, MD  Prescriptions prior to admission  Medication Sig Dispense Refill Last Dose  . acetaminophen (TYLENOL) 325 MG tablet Take 650 mg by mouth every 6 (six) hours as needed for pain.   04/07/2015  . aspirin 81 MG tablet Take 81 mg by mouth daily.   04/07/2015  . Cholecalciferol (VITAMIN D3) 2000 UNITS TABS Take by mouth daily.   04/07/2015  . felodipine (PLENDIL) 10 MG 24 hr tablet Take 1 tablet (10 mg total) by mouth daily. 90 tablet 1 04/07/2015  . hydrochlorothiazide (HYDRODIURIL) 25 MG tablet Take 1 tablet (25 mg total) by mouth daily. 90 tablet 1 04/08/2015 at 0600  . lisinopril (PRINIVIL,ZESTRIL) 20 MG tablet Take 2 tablets (40 mg total) by mouth daily. 180 tablet 1 04/08/2015 at 0600  . lovastatin (MEVACOR) 40 MG tablet Take 1 tablet (40 mg total) by mouth at bedtime. 30 tablet 5 04/07/2015  . mupirocin cream (BACTROBAN) 2 % Apply 1 application topically 2 (two) times daily. 15 g 0 04/07/2015     No Known Allergies   Past Medical History  Diagnosis Date  . Arthritis   . History of chicken pox   . Hypertension   . Peripheral vascular disease   . Peripheral vascular disease   . Obesity     Review of systems:      Physical Exam    Heart and lungs: Regular rate and rhythm without rub or gallop lungs are bilaterally clear    HEENT: Normocephalic atraumatic eyes  are anicteric    Other:     Pertinant exam for procedure: Soft nontender nondistended bowel sounds positive normoactive    Planned proceedures: EGD and colonoscopy I have discussed the risks benefits and complications of procedures to include not limited to bleeding, infection, perforation and the risk of sedation and the patient wishes to proceed.    Lollie Sails, MD Gastroenterology 04/08/2015  7:58 AM

## 2015-04-08 NOTE — Op Note (Signed)
[facility] Gastroenterology Patient Name: Charles Nichols Procedure Date: 04/08/2015 8:02 AM MRN: [id number] Account #: [id number] Date of Birth: 12/07/1950 Admit Type: Inpatient Age: 64 Room: [facility] ENDO ROOM 1 Gender: Male Note Status: Finalized Procedure:         Upper GI endoscopy Indications:       Heme positive stool Providers:         Lollie Sails, MD Referring MD:      Einar Pheasant, MD (Referring MD) Medicines:         Monitored Anesthesia Care Complications:     No immediate complications. Procedure:         Pre-Anesthesia Assessment:                    - ASA Grade Assessment: II - A patient with mild systemic                     disease.                    After obtaining informed consent, the endoscope was passed                     under direct vision. Throughout the procedure, the                     patient's blood pressure, pulse, and oxygen saturations                     were monitored continuously. The Olympus GIF-160 endoscope                     (S#. [id number]) was introduced through the mouth, and                     advanced to the third part of duodenum. The patient                     tolerated the procedure well. The upper GI endoscopy was                     accomplished without difficulty. Findings:      LA Grade B (one or more mucosal breaks greater than 5 mm, not extending       between the tops of two mucosal folds) esophagitis with no bleeding was       found. Biopsies were taken with a cold forceps for histology.      Patchy moderate inflammation characterized by adherent blood, erosions,       erythema and shallow ulcerations was found in the gastric antrum.       Biopsies were taken with a cold forceps for histology. Biopsies were       taken with a cold forceps for Helicobacter pylori testing.      The cardia and gastric fundus were normal on retroflexion.      Patchy minimal inflammation characterized by erythema was found in  the       duodenal bulb.      The exam of the esophagus was otherwise normal. Impression:        - LA Grade B erosive esophagitis. Biopsied.                    - Erosive gastritis. Biopsied.                    -  Duodenitis. Recommendation:    - Perform a colonoscopy today.                    - Await pathology results.                    - Use Protonix (pantoprazole) 40 mg PO daily. Procedure Code(s): --- Professional ---                    [phone], Esophagogastroduodenoscopy, flexible, transoral;                     with biopsy, single or multiple Diagnosis Code(s): --- Professional ---                    530.19, Other esophagitis                    535.40, Other specified gastritis, without mention of                     hemorrhage                    535.60, Duodenitis, without mention of hemorrhage                    792.1, Nonspecific abnormal findings in stool contents CPT copyright 2014 American Medical Association. All rights reserved. The codes documented in this report are preliminary and upon coder review may  be revised to meet current compliance requirements. Lollie Sails, MD 04/08/2015 8:33:55 AM This report has been signed electronically. Number of Addenda: 0 Note Initiated On: 04/08/2015 8:02 AM      [facility]

## 2015-04-08 NOTE — Anesthesia Postprocedure Evaluation (Signed)
  Anesthesia Post-op Note  Patient: Charles Nichols  Procedure(s) Performed: Procedure(s): COLONOSCOPY WITH PROPOFOL (N/A) ESOPHAGOGASTRODUODENOSCOPY (EGD) (N/A)  Anesthesia type:General  Patient location: PACU  Post pain: Pain level controlled  Post assessment: Post-op Vital signs reviewed, Patient's Cardiovascular Status Stable, Respiratory Function Stable, Patent Airway and No signs of Nausea or vomiting  Post vital signs: Reviewed and stable  Last Vitals:  Filed Vitals:   04/08/15 1000  BP: 123/75  Pulse: 64  Temp:   Resp: 9    Level of consciousness: awake, alert  and patient cooperative  Complications: No apparent anesthesia complications

## 2015-04-08 NOTE — Anesthesia Preprocedure Evaluation (Addendum)
Anesthesia Evaluation  Patient identified by MRN, date of birth, ID band Patient awake  General Assessment Comment:Somewhat resistant to meds  Reviewed: Allergy & Precautions, NPO status , Patient's Chart, lab work & pertinent test results  History of Anesthesia Complications (+) AWARENESS UNDER ANESTHESIA and history of anesthetic complications  Airway Mallampati: II  TM Distance: >3 FB Neck ROM: Full    Dental  (+) Chipped, Upper Dentures   Pulmonary former smoker,    Pulmonary exam normal       Cardiovascular hypertension, + Peripheral Vascular Disease Normal cardiovascular exam    Neuro/Psych negative neurological ROS  negative psych ROS   GI/Hepatic negative GI ROS, Neg liver ROS,   Endo/Other  negative endocrine ROS  Renal/GU negative Renal ROS  negative genitourinary   Musculoskeletal  (+) Arthritis -, Osteoarthritis,    Abdominal Normal abdominal exam  (+) + obese,   Peds negative pediatric ROS (+)  Hematology  (+) anemia ,   Anesthesia Other Findings   Reproductive/Obstetrics                            Anesthesia Physical Anesthesia Plan  ASA: III  Anesthesia Plan: General   Post-op Pain Management:    Induction: Intravenous  Airway Management Planned: Nasal Cannula  Additional Equipment:   Intra-op Plan:   Post-operative Plan:   Informed Consent: I have reviewed the patients History and Physical, chart, labs and discussed the procedure including the risks, benefits and alternatives for the proposed anesthesia with the patient or authorized representative who has indicated his/her understanding and acceptance.   Dental advisory given  Plan Discussed with: CRNA and Surgeon  Anesthesia Plan Comments:         Anesthesia Quick Evaluation

## 2015-04-08 NOTE — Op Note (Signed)
[facility] Gastroenterology Patient Name: Charles Nichols Procedure Date: 04/08/2015 8:02 AM MRN: [id number] Account #: [id number] Date of Birth: 11-07-1950 Admit Type: Inpatient Age: 64 Room: [facility] ENDO ROOM 1 Gender: Male Note Status: Finalized Procedure:         Colonoscopy Indications:       Heme positive stool Providers:         Lollie Sails, MD Referring MD:      Einar Pheasant, MD (Referring MD) Medicines:         Monitored Anesthesia Care Complications:     No immediate complications. Procedure:         Pre-Anesthesia Assessment:                    - ASA Grade Assessment: II - A patient with mild systemic                     disease.                    After obtaining informed consent, the colonoscope was                     passed under direct vision. Throughout the procedure, the                     patient's blood pressure, pulse, and oxygen saturations                     were monitored continuously. The Colonoscope was                     introduced through the anus and advanced to the the cecum,                     identified by appendiceal orifice and ileocecal valve. The                     colonoscopy was performed without difficulty. The patient                     tolerated the procedure well. The quality of the bowel                     preparation was good. Findings:      Multiple small-mouthed diverticula were found in the sigmoid colon and       in the descending colon.      The digital rectal exam was normal.      A less than 1 mm polyp was found in the cecum. The polyp was flat. The       polyp was removed with a cold biopsy forceps. Resection and retrieval       were complete.      The retroflexed view of the distal rectum and anal verge was normal and       showed no anal or rectal abnormalities. Impression:        - Diverticulosis in the sigmoid colon and in the                     descending colon.                    - One less  than 1 mm polyp in the cecum. Resected and  retrieved.                    - The distal rectum and anal verge are normal on                     retroflexion view. Recommendation:    - Check hemogram with white blood cell count and platelets                     today.                    - Check iron studies today.                    - if iron deficient, consider sbs and VCE Procedure Code(s): --- Professional ---                    [phone], Colonoscopy, flexible; with biopsy, single or                     multiple Diagnosis Code(s): --- Professional ---                    211.3, Benign neoplasm of colon                    792.1, Nonspecific abnormal findings in stool contents                    562.10, Diverticulosis of colon (without mention of                     hemorrhage) CPT copyright 2014 American Medical Association. All rights reserved. The codes documented in this report are preliminary and upon coder review may  be revised to meet current compliance requirements. Lollie Sails, MD 04/08/2015 9:01:36 AM This report has been signed electronically. Number of Addenda: 0 Note Initiated On: 04/08/2015 8:02 AM Scope Withdrawal Time: 0 hours 12 minutes 42 seconds  Total Procedure Duration: 0 hours 19 minutes 13 seconds       [facility]

## 2015-04-08 NOTE — Transfer of Care (Signed)
Immediate Anesthesia Transfer of Care Note  Patient: Charles Nichols  Procedure(s) Performed: Procedure(s): COLONOSCOPY WITH PROPOFOL (N/A) ESOPHAGOGASTRODUODENOSCOPY (EGD) (N/A)  Patient Location: PACU  Anesthesia Type:General  Level of Consciousness: sedated  Airway & Oxygen Therapy: Patient Spontanous Breathing  Post-op Assessment: Report given to RN  Post vital signs: Reviewed  Last Vitals:  Filed Vitals:   04/08/15 0902  BP: 115/61  Pulse:   Temp: 37 C  Resp:     Complications: No apparent anesthesia complications

## 2015-04-09 NOTE — Progress Notes (Signed)
Non-Identifying Answering Machine.  No message left.

## 2015-04-12 ENCOUNTER — Encounter: Payer: Self-pay | Admitting: Internal Medicine

## 2015-04-12 ENCOUNTER — Encounter: Payer: Self-pay | Admitting: Gastroenterology

## 2015-04-12 DIAGNOSIS — Z860100 Personal history of colon polyps, unspecified: Secondary | ICD-10-CM | POA: Insufficient documentation

## 2015-04-12 DIAGNOSIS — K209 Esophagitis, unspecified without bleeding: Secondary | ICD-10-CM | POA: Insufficient documentation

## 2015-04-12 LAB — SURGICAL PATHOLOGY

## 2015-05-20 ENCOUNTER — Ambulatory Visit (INDEPENDENT_AMBULATORY_CARE_PROVIDER_SITE_OTHER): Payer: BLUE CROSS/BLUE SHIELD | Admitting: Internal Medicine

## 2015-05-20 ENCOUNTER — Encounter: Payer: Self-pay | Admitting: Internal Medicine

## 2015-05-20 VITALS — BP 122/80 | HR 87 | Temp 98.5°F | Ht 72.5 in | Wt 264.0 lb

## 2015-05-20 DIAGNOSIS — K209 Esophagitis, unspecified without bleeding: Secondary | ICD-10-CM

## 2015-05-20 DIAGNOSIS — D500 Iron deficiency anemia secondary to blood loss (chronic): Secondary | ICD-10-CM

## 2015-05-20 DIAGNOSIS — Z86010 Personal history of colonic polyps: Secondary | ICD-10-CM

## 2015-05-20 DIAGNOSIS — E7800 Pure hypercholesterolemia, unspecified: Secondary | ICD-10-CM

## 2015-05-20 DIAGNOSIS — I10 Essential (primary) hypertension: Secondary | ICD-10-CM

## 2015-05-20 DIAGNOSIS — I739 Peripheral vascular disease, unspecified: Secondary | ICD-10-CM | POA: Diagnosis not present

## 2015-05-20 DIAGNOSIS — M79671 Pain in right foot: Secondary | ICD-10-CM

## 2015-05-20 LAB — BASIC METABOLIC PANEL
BUN: 13 mg/dL (ref 4–21)
CREATININE: 0.8 mg/dL (ref 0.6–1.3)
Glucose: 102 mg/dL
Potassium: 3.9 mmol/L (ref 3.4–5.3)
SODIUM: 141 mmol/L (ref 137–147)

## 2015-05-20 LAB — LIPID PANEL
Cholesterol: 186 mg/dL (ref 0–200)
HDL: 61 mg/dL (ref 35–70)
LDL CALC: 105 mg/dL
LDl/HDL Ratio: 3
Triglycerides: 98 mg/dL (ref 40–160)

## 2015-05-20 LAB — CBC AND DIFFERENTIAL
HCT: 38 % — AB (ref 41–53)
Hemoglobin: 12.6 g/dL — AB (ref 13.5–17.5)
Neutrophils Absolute: 3 /uL
PLATELETS: 174 10*3/uL (ref 150–399)
WBC: 5.7 10^3/mL

## 2015-05-20 LAB — HEPATIC FUNCTION PANEL
ALT: 16 U/L (ref 10–40)
AST: 20 U/L (ref 14–40)
Alkaline Phosphatase: 65 U/L (ref 25–125)
BILIRUBIN, TOTAL: 0.1 mg/dL

## 2015-05-20 LAB — TSH: TSH: 3.74 u[IU]/mL (ref 0.41–5.90)

## 2015-05-20 NOTE — Progress Notes (Signed)
Pre visit review using our clinic review tool, if applicable. No additional management support is needed unless otherwise documented below in the visit note. 

## 2015-05-20 NOTE — Progress Notes (Signed)
Patient ID: Charles Nichols, male   DOB: 09-30-1951, 64 y.o.   MRN: [id number]   Subjective:    Patient ID: Charles Nichols, male    DOB: 02/14/1951, 64 y.o.   MRN: [id number]  HPI  Patient here for a scheduled follow up.  He is trying to stay active.  No cardiac symptoms with increased activity or exertion.  No acid reflux.  No swallowing issues.  No abdominal pain or cramping.  Bowels stable.  Having some issues with a callous on his foot.  Sees Dr Cleda Mccreedy.  He removes the skin from around the area.  This helps intermittently.     Past Medical History  Diagnosis Date  . Arthritis   . History of chicken pox   . Hypertension   . Peripheral vascular disease   . Peripheral vascular disease   . Obesity     Family history and social history reviewed.     Outpatient Encounter Prescriptions as of 05/20/2015  Medication Sig  . acetaminophen (TYLENOL) 325 MG tablet Take 650 mg by mouth every 6 (six) hours as needed for pain.  Marland Kitchen aspirin 81 MG tablet Take 81 mg by mouth daily.  . Cholecalciferol (VITAMIN D3) 2000 UNITS TABS Take by mouth daily.  . felodipine (PLENDIL) 10 MG 24 hr tablet Take 1 tablet (10 mg total) by mouth daily.  . hydrochlorothiazide (HYDRODIURIL) 25 MG tablet Take 1 tablet (25 mg total) by mouth daily.  Marland Kitchen lisinopril (PRINIVIL,ZESTRIL) 20 MG tablet Take 2 tablets (40 mg total) by mouth daily.  Marland Kitchen lovastatin (MEVACOR) 40 MG tablet Take 1 tablet (40 mg total) by mouth at bedtime.  . mupirocin cream (BACTROBAN) 2 % Apply 1 application topically 2 (two) times daily.  . pantoprazole (PROTONIX) 40 MG tablet Take 40 mg by mouth daily.   No facility-administered encounter medications on file as of 05/20/2015.    Review of Systems  Constitutional: Negative for appetite change and unexpected weight change.  HENT: Negative for congestion and sinus pressure.   Respiratory: Negative for cough, chest tightness and shortness of breath.   Cardiovascular: Negative for chest pain, palpitations  and leg swelling.  Gastrointestinal: Negative for nausea, vomiting, abdominal pain and diarrhea.  Genitourinary: Negative for dysuria and difficulty urinating.  Musculoskeletal: Negative for back pain and joint swelling.  Skin: Negative for color change and rash.  Neurological: Negative for dizziness, light-headedness and headaches.  Hematological: Negative for adenopathy. Does not bruise/bleed easily.  Psychiatric/Behavioral: Negative for dysphoric mood and agitation.       Objective:     Blood pressure rechecked by me:  136/82  Physical Exam  Constitutional: He appears well-developed and well-nourished. No distress.  HENT:  Nose: Nose normal.  Mouth/Throat: Oropharynx is clear and moist.  Neck: Neck supple. No thyromegaly present.  Cardiovascular: Normal rate and regular rhythm.   Pulmonary/Chest: Effort normal and breath sounds normal. No respiratory distress.  Abdominal: Soft. Bowel sounds are normal. There is no tenderness.  Musculoskeletal: He exhibits no edema or tenderness.  Lymphadenopathy:    He has no cervical adenopathy.  Skin: No rash noted. No erythema.  Psychiatric: He has a normal mood and affect. His behavior is normal.    BP 122/80 mmHg  Pulse 87  Temp(Src) 98.5 F (36.9 C) (Oral)  Ht 6' 0.5" (1.842 m)  Wt 264 lb (119.75 kg)  BMI 35.29 kg/m2  SpO2 97% Wt Readings from Last 3 Encounters:  05/20/15 264 lb (119.75 kg)  04/08/15 256 lb (116.121  kg)  11/19/14 254 lb 8 oz (115.44 kg)     Lab Results  Component Value Date   WBC 5.7 05/20/2015   HGB 12.6* 05/20/2015   HCT 38* 05/20/2015   PLT 174 05/20/2015   CHOL 186 05/20/2015   TRIG 98 05/20/2015   HDL 61 05/20/2015   LDLCALC 105 05/20/2015   ALT 16 05/20/2015   AST 20 05/20/2015   NA 141 05/20/2015   K 3.9 05/20/2015   CREATININE 0.8 05/20/2015   BUN 13 05/20/2015   TSH 3.74 05/20/2015   HGBA1C 5.8 11/29/2014       Assessment & Plan:   Problem List Items Addressed This Visit     Anemia    Hgb checked 05/18/15 - 12.6.  Wnl.  Follow.        Esophagitis    04/08/15 - gastritis and esophagitis and duodenitis.  Pathology - reflux gastroesophagitis.  Currently doing well on protonix.  Follow.       Essential hypertension, benign - Primary    Blood pressure under good control.  Continue same medication regimen.  Follow pressures.  Follow metabolic panel.        Foot pain, right    Seeing Dr Cleda Mccreedy.        History of colonic polyps    Colonoscopy 04/08/15 - diverticulosis, <29mm polyp in the cecum - tubular adenomatous polyp cecum (04/12/15) - repeat 5 years.        Hypercholesterolemia    On lovastatin.  Low cholesterol diet and exercise.  Follow lipid panel and liver function tests.  LDL 105 on recent check.        Peripheral vascular disease    Some leg discomfort at times.  Notices mostly with inclines.  Able to walk without significant difficulty.  No cramping.  DP pulses palpable and equal bilaterally.  Discussed with him today.  Stays active.  Will follow.  Notify me if any change or worsening problems.            Charles Pheasant, MD

## 2015-05-22 ENCOUNTER — Encounter: Payer: Self-pay | Admitting: Internal Medicine

## 2015-05-22 DIAGNOSIS — M79671 Pain in right foot: Secondary | ICD-10-CM | POA: Insufficient documentation

## 2015-05-22 NOTE — Assessment & Plan Note (Signed)
Some leg discomfort at times.  Notices mostly with inclines.  Able to walk without significant difficulty.  No cramping.  DP pulses palpable and equal bilaterally.  Discussed with him today.  Stays active.  Will follow.  Notify me if any change or worsening problems.

## 2015-05-22 NOTE — Assessment & Plan Note (Signed)
Blood pressure under good control.  Continue same medication regimen.  Follow pressures.  Follow metabolic panel.   

## 2015-05-22 NOTE — Assessment & Plan Note (Signed)
Seeing Dr Cleda Mccreedy.

## 2015-05-22 NOTE — Assessment & Plan Note (Addendum)
On lovastatin.  Low cholesterol diet and exercise.  Follow lipid panel and liver function tests.  LDL 105 on recent check.

## 2015-05-22 NOTE — Assessment & Plan Note (Signed)
Hgb checked 05/18/15 - 12.6.  Wnl.  Follow.

## 2015-05-22 NOTE — Assessment & Plan Note (Signed)
04/08/15 - gastritis and esophagitis and duodenitis.  Pathology - reflux gastroesophagitis.  Currently doing well on protonix.  Follow.

## 2015-05-22 NOTE — Assessment & Plan Note (Signed)
Colonoscopy 04/08/15 - diverticulosis, <59mm polyp in the cecum - tubular adenomatous polyp cecum (04/12/15) - repeat 5 years.

## 2015-06-03 ENCOUNTER — Other Ambulatory Visit: Payer: Self-pay | Admitting: *Deleted

## 2015-06-03 MED ORDER — HYDROCHLOROTHIAZIDE 25 MG PO TABS: 25.0000 mg | ORAL_TABLET | Freq: Every day | ORAL | Status: DC

## 2015-06-03 MED ORDER — LOVASTATIN 40 MG PO TABS: 40.0000 mg | ORAL_TABLET | Freq: Every day | ORAL | Status: DC

## 2015-06-03 MED ORDER — FELODIPINE ER 10 MG PO TB24: 10.0000 mg | ORAL_TABLET | Freq: Every day | ORAL | Status: DC

## 2015-06-03 MED ORDER — LISINOPRIL 20 MG PO TABS: 40.0000 mg | ORAL_TABLET | Freq: Every day | ORAL | Status: DC

## 2015-08-26 ENCOUNTER — Ambulatory Visit: Payer: BLUE CROSS/BLUE SHIELD | Admitting: Internal Medicine

## 2015-10-27 ENCOUNTER — Ambulatory Visit (INDEPENDENT_AMBULATORY_CARE_PROVIDER_SITE_OTHER): Payer: BLUE CROSS/BLUE SHIELD | Admitting: Internal Medicine

## 2015-10-27 ENCOUNTER — Encounter: Payer: Self-pay | Admitting: Internal Medicine

## 2015-10-27 VITALS — BP 140/80 | HR 100 | Temp 98.0°F | Resp 18 | Ht 72.5 in | Wt 294.1 lb

## 2015-10-27 DIAGNOSIS — R635 Abnormal weight gain: Secondary | ICD-10-CM

## 2015-10-27 DIAGNOSIS — D500 Iron deficiency anemia secondary to blood loss (chronic): Secondary | ICD-10-CM

## 2015-10-27 DIAGNOSIS — I739 Peripheral vascular disease, unspecified: Secondary | ICD-10-CM

## 2015-10-27 DIAGNOSIS — I10 Essential (primary) hypertension: Secondary | ICD-10-CM | POA: Diagnosis not present

## 2015-10-27 DIAGNOSIS — E7800 Pure hypercholesterolemia, unspecified: Secondary | ICD-10-CM

## 2015-10-27 DIAGNOSIS — Z86010 Personal history of colonic polyps: Secondary | ICD-10-CM

## 2015-10-27 DIAGNOSIS — K209 Esophagitis, unspecified without bleeding: Secondary | ICD-10-CM

## 2015-10-27 MED ORDER — FELODIPINE ER 10 MG PO TB24: 10.0000 mg | ORAL_TABLET | Freq: Every day | ORAL | Status: DC

## 2015-10-27 MED ORDER — HYDROCHLOROTHIAZIDE 25 MG PO TABS: 25.0000 mg | ORAL_TABLET | Freq: Every day | ORAL | Status: DC

## 2015-10-27 MED ORDER — LOVASTATIN 40 MG PO TABS: 40.0000 mg | ORAL_TABLET | Freq: Every day | ORAL | Status: DC

## 2015-10-27 MED ORDER — LISINOPRIL 20 MG PO TABS: 40.0000 mg | ORAL_TABLET | Freq: Every day | ORAL | Status: DC

## 2015-10-27 NOTE — Progress Notes (Signed)
Patient ID: Charles Nichols, male   DOB: 01-27-1951, 65 y.o.   MRN: [id number]   Subjective:    Patient ID: Charles Nichols, male    DOB: 16-Dec-1950, 65 y.o.   MRN: [id number]  HPI  Patient with past history of hypertension, PVD and hypercholesterolemia.  He comes in today to follow up on these issues.  He retired.  Gained weight.  Was staying off his feet to help with a foot lesion.  Foot is better.  Plans to adjust his diet.  Plans to start exercising.  Wants to lose weight.  No chest pain or tightness.  No sob.  No acid reflux. No abdominal pain or cramping.  Bowels stable.     Past Medical History  Diagnosis Date  . Arthritis   . History of chicken pox   . Hypertension   . Peripheral vascular disease ([place])   . Peripheral vascular disease ([place])   . Obesity    Past Surgical History  Procedure Laterality Date  . Leg stent      pvd  . Femoral-popliteal bypass graft Bilateral 1997, 2000  . Circumcision    . Colonoscopy with propofol N/A 04/08/2015    Procedure: COLONOSCOPY WITH PROPOFOL;  Surgeon: Lollie Sails, MD;  Location: [facility] ENDOSCOPY;  Service: Endoscopy;  Laterality: N/A;  . Esophagogastroduodenoscopy N/A 04/08/2015    Procedure: ESOPHAGOGASTRODUODENOSCOPY (EGD);  Surgeon: Lollie Sails, MD;  Location: [facility] ENDOSCOPY;  Service: Endoscopy;  Laterality: N/A;   Family History  Problem Relation Age of Onset  . Breast cancer Sister   . Hypertension Sister   . Hypertension Brother   . Breast cancer Daughter    Social History   Social History  . Marital Status: Married    Spouse Name: N/A  . Number of Children: N/A  . Years of Education: N/A   Social History Main Topics  . Smoking status: Former Research scientist (life sciences)  . Smokeless tobacco: Never Used  . Alcohol Use: 0.0 oz/week    0 Standard drinks or equivalent per week  . Drug Use: No  . Sexual Activity: Not Asked   Other Topics Concern  . None   Social History Narrative    Outpatient Encounter Prescriptions as of 10/27/2015    Medication Sig  . acetaminophen (TYLENOL) 325 MG tablet Take 650 mg by mouth every 6 (six) hours as needed for pain.  Marland Kitchen aspirin 81 MG tablet Take 81 mg by mouth daily.  . Cholecalciferol (VITAMIN D3) 2000 UNITS TABS Take by mouth daily.  . felodipine (PLENDIL) 10 MG 24 hr tablet Take 1 tablet (10 mg total) by mouth daily.  . hydrochlorothiazide (HYDRODIURIL) 25 MG tablet Take 1 tablet (25 mg total) by mouth daily.  Marland Kitchen lisinopril (PRINIVIL,ZESTRIL) 20 MG tablet Take 2 tablets (40 mg total) by mouth daily.  Marland Kitchen lovastatin (MEVACOR) 40 MG tablet Take 1 tablet (40 mg total) by mouth at bedtime.  . mupirocin cream (BACTROBAN) 2 % Apply 1 application topically 2 (two) times daily.  . pantoprazole (PROTONIX) 40 MG tablet Take 40 mg by mouth daily.  . [DISCONTINUED] felodipine (PLENDIL) 10 MG 24 hr tablet Take 1 tablet (10 mg total) by mouth daily.  . [DISCONTINUED] hydrochlorothiazide (HYDRODIURIL) 25 MG tablet Take 1 tablet (25 mg total) by mouth daily.  . [DISCONTINUED] lisinopril (PRINIVIL,ZESTRIL) 20 MG tablet Take 2 tablets (40 mg total) by mouth daily.  . [DISCONTINUED] lovastatin (MEVACOR) 40 MG tablet Take 1 tablet (40 mg total) by mouth at bedtime.  No facility-administered encounter medications on file as of 10/27/2015.    Review of Systems  Constitutional:       Weight gain.  Not watching his diet.   HENT: Negative for congestion and sinus pressure.   Respiratory: Negative for cough, chest tightness and shortness of breath.   Cardiovascular: Negative for chest pain, palpitations and leg swelling.  Gastrointestinal: Negative for nausea, vomiting, abdominal pain and diarrhea.  Genitourinary: Negative for dysuria and difficulty urinating.  Musculoskeletal: Negative for back pain and joint swelling.  Skin: Negative for color change and rash.  Neurological: Negative for dizziness, light-headedness and headaches.  Psychiatric/Behavioral: Negative for dysphoric mood and agitation.        Objective:    Physical Exam  Constitutional: He appears well-developed and well-nourished. No distress.  HENT:  Nose: Nose normal.  Mouth/Throat: Oropharynx is clear and moist.  Eyes: Conjunctivae are normal. Right eye exhibits no discharge. Left eye exhibits no discharge.  Neck: Neck supple. No thyromegaly present.  Cardiovascular: Normal rate and regular rhythm.   Pulmonary/Chest: Effort normal and breath sounds normal. No respiratory distress.  Abdominal: Soft. Bowel sounds are normal. There is no tenderness.  Musculoskeletal: He exhibits no edema or tenderness.  Lymphadenopathy:    He has no cervical adenopathy.  Skin: No rash noted. No erythema.  Psychiatric: He has a normal mood and affect. His behavior is normal.    BP 140/80 mmHg  Pulse 100  Temp(Src) 98 F (36.7 C) (Oral)  Resp 18  Ht 6' 0.5" (1.842 m)  Wt 294 lb 2 oz (133.414 kg)  BMI 39.32 kg/m2  SpO2 95% Wt Readings from Last 3 Encounters:  10/27/15 294 lb 2 oz (133.414 kg)  05/20/15 264 lb (119.75 kg)  04/08/15 256 lb (116.121 kg)     Lab Results  Component Value Date   WBC 5.7 05/20/2015   HGB 12.6* 05/20/2015   HCT 38* 05/20/2015   PLT 174 05/20/2015   CHOL 186 05/20/2015   TRIG 98 05/20/2015   HDL 61 05/20/2015   LDLCALC 105 05/20/2015   ALT 16 05/20/2015   AST 20 05/20/2015   NA 141 05/20/2015   K 3.9 05/20/2015   CREATININE 0.8 05/20/2015   BUN 13 05/20/2015   TSH 3.74 05/20/2015   HGBA1C 5.8 11/29/2014       Assessment & Plan:   Problem List Items Addressed This Visit    Anemia    Follow cbc.        Esophagitis    EGD 04/08/15 - gastritis and esophagitis and duodenitis.  On protonix.  Symptoms currently doing well.        Essential hypertension, benign    Blood pressure has been doing well.  Elevated now with weight gain.  He is planning to adjust his diet and exercise.  Will continue same medication regimen for now.  Follow pressures.  Get him back in soon to reassess.         Relevant Medications   felodipine (PLENDIL) 10 MG 24 hr tablet   hydrochlorothiazide (HYDRODIURIL) 25 MG tablet   lisinopril (PRINIVIL,ZESTRIL) 20 MG tablet   lovastatin (MEVACOR) 40 MG tablet   History of colonic polyps    Colonoscopy 04/08/15.  Recommended f/u colonoscopy in 5 years.        Hypercholesterolemia    On mevacor.  Low cholesterol diet and exercise.  Follow lipid panel and liver function tests.        Relevant Medications   felodipine (PLENDIL) 10  MG 24 hr tablet   hydrochlorothiazide (HYDRODIURIL) 25 MG tablet   lisinopril (PRINIVIL,ZESTRIL) 20 MG tablet   lovastatin (MEVACOR) 40 MG tablet   Peripheral vascular disease ([facility]) - Primary    Overall legs doing well.  No pain.  Follow.  Continue risk factor modification.  Continue daily aspirin.        Relevant Medications   felodipine (PLENDIL) 10 MG 24 hr tablet   hydrochlorothiazide (HYDRODIURIL) 25 MG tablet   lisinopril (PRINIVIL,ZESTRIL) 20 MG tablet   lovastatin (MEVACOR) 40 MG tablet   Weight gain    Weight gatin as outlined.  Discussed diet and exercise.  Follow.            Einar Pheasant, MD

## 2015-10-27 NOTE — Progress Notes (Signed)
Pre-visit discussion using our clinic review tool. No additional management support is needed unless otherwise documented below in the visit note.  

## 2015-10-30 ENCOUNTER — Encounter: Payer: Self-pay | Admitting: Internal Medicine

## 2015-10-30 DIAGNOSIS — R635 Abnormal weight gain: Secondary | ICD-10-CM | POA: Insufficient documentation

## 2015-10-30 NOTE — Assessment & Plan Note (Signed)
Follow cbc.  

## 2015-10-30 NOTE — Assessment & Plan Note (Signed)
EGD 04/08/15 - gastritis and esophagitis and duodenitis.  On protonix.  Symptoms currently doing well.

## 2015-10-30 NOTE — Assessment & Plan Note (Signed)
Blood pressure has been doing well.  Elevated now with weight gain.  He is planning to adjust his diet and exercise.  Will continue same medication regimen for now.  Follow pressures.  Get him back in soon to reassess.

## 2015-10-30 NOTE — Assessment & Plan Note (Signed)
Overall legs doing well.  No pain.  Follow.  Continue risk factor modification.  Continue daily aspirin.

## 2015-10-30 NOTE — Assessment & Plan Note (Signed)
On mevacor.  Low cholesterol diet and exercise.  Follow lipid panel and liver function tests.  

## 2015-10-30 NOTE — Assessment & Plan Note (Signed)
Colonoscopy 04/08/15.  Recommended f/u colonoscopy in 5 years.

## 2015-10-30 NOTE — Assessment & Plan Note (Signed)
Weight gatin as outlined.  Discussed diet and exercise.  Follow.

## 2015-11-02 ENCOUNTER — Telehealth: Payer: Self-pay

## 2015-11-02 DIAGNOSIS — E7800 Pure hypercholesterolemia, unspecified: Secondary | ICD-10-CM

## 2015-11-02 DIAGNOSIS — D500 Iron deficiency anemia secondary to blood loss (chronic): Secondary | ICD-10-CM

## 2015-11-02 DIAGNOSIS — I10 Essential (primary) hypertension: Secondary | ICD-10-CM

## 2015-11-02 NOTE — Telephone Encounter (Signed)
Pt wants order for labs to done before appointment in December 27, 2015

## 2015-11-03 NOTE — Telephone Encounter (Signed)
Appointment made

## 2015-11-03 NOTE — Telephone Encounter (Signed)
Orders placed for labs.  Please schedule lab appt within the next 2-3 weeks.

## 2015-12-06 ENCOUNTER — Telehealth: Payer: Self-pay | Admitting: Internal Medicine

## 2015-12-06 NOTE — Telephone Encounter (Signed)
FYI

## 2015-12-06 NOTE — Telephone Encounter (Signed)
noted 

## 2015-12-06 NOTE — Telephone Encounter (Signed)
To Dr Nicki Reaper: Pt called stating he will not be able to come in for his lab appt on 12/08/2015 due to not having insurance right now. Pt is working on it and should be able to come in for his follow up appt. Pt wants Dr Nicki Reaper to know that he is doing well and lost a couple of pounds and BP is doing well. Thank you!

## 2015-12-06 NOTE — Telephone Encounter (Signed)
Ok.  Just tell him to let us know if he needs anything.

## 2015-12-08 ENCOUNTER — Other Ambulatory Visit: Payer: BLUE CROSS/BLUE SHIELD

## 2015-12-27 ENCOUNTER — Ambulatory Visit: Payer: BLUE CROSS/BLUE SHIELD | Admitting: Internal Medicine

## 2016-03-16 ENCOUNTER — Emergency Department
Admission: EM | Admit: 2016-03-16 | Discharge: 2016-03-16 | Disposition: A | Discharge status: Home / Self Care | Payer: Medicare Other | Attending: Emergency Medicine | Admitting: Emergency Medicine

## 2016-03-16 ENCOUNTER — Encounter: Payer: Self-pay | Admitting: Emergency Medicine

## 2016-03-16 DIAGNOSIS — W57XXXA Bitten or stung by nonvenomous insect and other nonvenomous arthropods, initial encounter: Secondary | ICD-10-CM | POA: Diagnosis not present

## 2016-03-16 DIAGNOSIS — I739 Peripheral vascular disease, unspecified: Secondary | ICD-10-CM | POA: Insufficient documentation

## 2016-03-16 DIAGNOSIS — S80862A Insect bite (nonvenomous), left lower leg, initial encounter: Secondary | ICD-10-CM | POA: Insufficient documentation

## 2016-03-16 DIAGNOSIS — Y999 Unspecified external cause status: Secondary | ICD-10-CM | POA: Diagnosis not present

## 2016-03-16 DIAGNOSIS — Z7982 Long term (current) use of aspirin: Secondary | ICD-10-CM | POA: Insufficient documentation

## 2016-03-16 DIAGNOSIS — M1990 Unspecified osteoarthritis, unspecified site: Secondary | ICD-10-CM | POA: Insufficient documentation

## 2016-03-16 DIAGNOSIS — Z87891 Personal history of nicotine dependence: Secondary | ICD-10-CM | POA: Diagnosis not present

## 2016-03-16 DIAGNOSIS — Y929 Unspecified place or not applicable: Secondary | ICD-10-CM | POA: Diagnosis not present

## 2016-03-16 DIAGNOSIS — Y939 Activity, unspecified: Secondary | ICD-10-CM | POA: Diagnosis not present

## 2016-03-16 DIAGNOSIS — I10 Essential (primary) hypertension: Secondary | ICD-10-CM | POA: Diagnosis not present

## 2016-03-16 DIAGNOSIS — Z79899 Other long term (current) drug therapy: Secondary | ICD-10-CM | POA: Diagnosis not present

## 2016-03-16 MED ORDER — DOXYCYCLINE HYCLATE 100 MG PO CAPS: 100.0000 mg | ORAL_CAPSULE | Freq: Two times a day (BID) | ORAL | Status: DC

## 2016-03-16 NOTE — ED Notes (Signed)
C/O several tick bites recently.  One tick removed Tuesday morning.  C/O left leg redness at tick bite site.

## 2016-03-16 NOTE — ED Provider Notes (Signed)
[facility] Provider Note   ____________________________________________  Time seen: Approximately 7:54 AM  I have reviewed the triage vital signs and the nursing notes.   HISTORY  Chief Complaint Tick Removal   HPI Charles Nichols is a 65 y.o. male is here with complaint of tick bite. Patient states he has had 3 ticks bite him on his left leg and he is pulled all them off without any difficulty. There is a single area that is raised and red on his lower left leg that he is worried about. He denies any rash or headache. There is been noted fever, chills, nausea or vomiting. Patient states that the last tick that he removed was 3 days ago. He states there is a lot of ticks in his area.   Past Medical History  Diagnosis Date  . Arthritis   . History of chicken pox   . Hypertension   . Peripheral vascular disease ([place])   . Peripheral vascular disease ([place])   . Obesity     Patient Active Problem List   Diagnosis Date Noted  . Weight gain 10/30/2015  . Foot pain, right 05/22/2015  . History of colonic polyps 04/12/2015  . Esophagitis 04/12/2015  . Anemia 11/22/2014  . Health care maintenance 11/22/2014  . Groin pain 06/02/2014  . Essential hypertension, benign 06/20/2013  . Peripheral vascular disease ([place]) 06/20/2013  . Hypercholesterolemia 06/20/2013    Past Surgical History  Procedure Laterality Date  . Leg stent      pvd  . Femoral-popliteal bypass graft Bilateral 1997, 2000  . Circumcision    . Colonoscopy with propofol N/A 04/08/2015    Procedure: COLONOSCOPY WITH PROPOFOL;  Surgeon: Lollie Sails, MD;  Location: [facility] ENDOSCOPY;  Service: Endoscopy;  Laterality: N/A;  . Esophagogastroduodenoscopy N/A 04/08/2015    Procedure: ESOPHAGOGASTRODUODENOSCOPY (EGD);  Surgeon: Lollie Sails, MD;  Location: [facility] ENDOSCOPY;  Service: Endoscopy;  Laterality: N/A;    Current Outpatient Rx  Name  Route  Sig  Dispense  Refill   . acetaminophen (TYLENOL) 325 MG tablet   Oral   Take 650 mg by mouth every 6 (six) hours as needed for pain.         Marland Kitchen aspirin 81 MG tablet   Oral   Take 81 mg by mouth daily.         . Cholecalciferol (VITAMIN D3) 2000 UNITS TABS   Oral   Take by mouth daily.         Marland Kitchen doxycycline (VIBRAMYCIN) 100 MG capsule   Oral   Take 1 capsule (100 mg total) by mouth 2 (two) times daily.   14 capsule   0   . felodipine (PLENDIL) 10 MG 24 hr tablet   Oral   Take 1 tablet (10 mg total) by mouth daily.   90 tablet   3   . hydrochlorothiazide (HYDRODIURIL) 25 MG tablet   Oral   Take 1 tablet (25 mg total) by mouth daily.   90 tablet   3   . lisinopril (PRINIVIL,ZESTRIL) 20 MG tablet   Oral   Take 2 tablets (40 mg total) by mouth daily.   180 tablet   3   . lovastatin (MEVACOR) 40 MG tablet   Oral   Take 1 tablet (40 mg total) by mouth at bedtime.   30 tablet   11   . mupirocin cream (BACTROBAN) 2 %   Topical   Apply 1 application topically 2 (  two) times daily.   15 g   0   . pantoprazole (PROTONIX) 40 MG tablet   Oral   Take 40 mg by mouth daily.           Allergies Review of patient's allergies indicates no known allergies.  Family History  Problem Relation Age of Onset  . Breast cancer Sister   . Hypertension Sister   . Hypertension Brother   . Breast cancer Daughter     Social History Social History  Substance Use Topics  . Smoking status: Former Research scientist (life sciences)  . Smokeless tobacco: Never Used  . Alcohol Use: 0.0 oz/week    0 Standard drinks or equivalent per week    Review of Systems Constitutional: No fever/chills Eyes: No visual changes. Cardiovascular: Denies chest pain. Respiratory: Denies shortness of breath. Gastrointestinal: No abdominal pain.  No nausea, no vomiting.  Musculoskeletal: Negative for back pain.Denies joint pain. Skin: Positive single lesion or secondary to tick bite. Neurological: Negative for headaches, focal weakness or  numbness.  10-point ROS otherwise negative.  ____________________________________________   PHYSICAL EXAM:  VITAL SIGNS: ED Triage Vitals  Enc Vitals Group     BP 03/16/16 0736 202/83 mmHg     Pulse Rate 03/16/16 0736 91     Resp 03/16/16 0736 18     Temp 03/16/16 0736 97.9 F (36.6 C)     Temp Source 03/16/16 0736 Oral     SpO2 03/16/16 0736 97 %     Weight 03/16/16 0736 299 lb (135.626 kg)     Height 03/16/16 0736 6\' 3"  (1.905 m)     Head Cir --      Peak Flow --      Pain Score 03/16/16 0736 0     Pain Loc --      Pain Edu? --      Excl. in [place]? --     Constitutional: Alert and oriented. Well appearing and in no acute distress. Eyes: Conjunctivae are normal. PERRL. EOMI. Head: Atraumatic. Nose: No congestion/rhinnorhea. Neck: No stridor.   Cardiovascular: Normal rate, regular rhythm. Grossly normal heart sounds.  Good peripheral circulation. Respiratory: Normal respiratory effort.  No retractions. Lungs CTAB. Musculoskeletal: Moves upper and lower extremities without any difficulty normal gait was noted. Neurologic:  Normal speech and language. No gross focal neurologic deficits are appreciated. No gait instability. Skin:  Skin is warm, dry. There is one single erythematous lesion on the lower left leg without open skin. There is no palpable abscess in the area. Area is nontender to palpation. No warmth is appreciated. Psychiatric: Mood and affect are normal. Speech and behavior are normal.  ____________________________________________   LABS (all labs ordered are listed, but only abnormal results are displayed)  Labs Reviewed - No data to display   PROCEDURES  Procedure(s) performed: None  Critical Care performed: No  ____________________________________________   INITIAL IMPRESSION / ASSESSMENT AND PLAN / ED COURSE  Pertinent labs & imaging results that were available during my care of the patient were reviewed by me and considered in my medical  decision making (see chart for details).  Patient was placed on doxycycline 100 mg twice a day for 7 days. Patient is to follow-up with his primary care doctor if any continued problems or urgent concerns. ____________________________________________   FINAL CLINICAL IMPRESSION(S) / ED DIAGNOSES  Final diagnoses:  Tick bite of lower leg, left, initial encounter      NEW MEDICATIONS STARTED DURING THIS VISIT:  Discharge Medication List as of  03/16/2016  8:24 AM    START taking these medications   Details  doxycycline (VIBRAMYCIN) 100 MG capsule Take 1 capsule (100 mg total) by mouth 2 (two) times daily., Starting 03/16/2016, Until Discontinued, Print         Note:  This document was prepared using Dragon voice recognition software and may include unintentional dictation errors.    Johnn Hai, PA-C 03/16/16 1421  Schuyler Amor, MD 03/16/16 1538

## 2016-03-16 NOTE — Discharge Instructions (Signed)
Tick Bite Information Ticks are insects that attach themselves to the skin and draw blood for food. There are various types of ticks. Common types include wood ticks and deer ticks. Most ticks live in shrubs and grassy areas. Ticks can climb onto your body when you make contact with leaves or grass where the tick is waiting. The most common places on the body for ticks to attach themselves are the scalp, neck, armpits, waist, and groin. Most tick bites are harmless, but sometimes ticks carry germs that cause diseases. These germs can be spread to a person during the tick's feeding process. The chance of a disease spreading through a tick bite depends on:   The type of tick.  Time of year.   How long the tick is attached.   Geographic location.  HOW CAN YOU PREVENT TICK BITES? Take these steps to help prevent tick bites when you are outdoors:  Wear protective clothing. Long sleeves and long pants are best.   Wear white clothes so you can see ticks more easily.  Tuck your pant legs into your socks.   If walking on a trail, stay in the middle of the trail to avoid brushing against bushes.  Avoid walking through areas with long grass.  Put insect repellent on all exposed skin and along boot tops, pant legs, and sleeve cuffs.   Check clothing, hair, and skin repeatedly and before going inside.   Brush off any ticks that are not attached.  Take a shower or bath as soon as possible after being outdoors.  WHAT IS THE PROPER WAY TO REMOVE A TICK? Ticks should be removed as soon as possible to help prevent diseases caused by tick bites. 1. If latex gloves are available, put them on before trying to remove a tick.  2. Using fine-point tweezers, grasp the tick as close to the skin as possible. You may also use curved forceps or a tick removal tool. Grasp the tick as close to its head as possible. Avoid grasping the tick on its body. 3. Pull gently with steady upward pressure until  the tick lets go. Do not twist the tick or jerk it suddenly. This may break off the tick's head or mouth parts. 4. Do not squeeze or crush the tick's body. This could force disease-carrying fluids from the tick into your body.  5. After the tick is removed, wash the bite area and your hands with soap and water or other disinfectant such as alcohol. 6. Apply a small amount of antiseptic cream or ointment to the bite site.  7. Wash and disinfect any instruments that were used.  Do not try to remove a tick by applying a hot match, petroleum jelly, or fingernail polish to the tick. These methods do not work and may increase the chances of disease being spread from the tick bite.  WHEN SHOULD YOU SEEK MEDICAL CARE? Contact your health care provider if you are unable to remove a tick from your skin or if a part of the tick breaks off and is stuck in the skin.  After a tick bite, you need to be aware of signs and symptoms that could be related to diseases spread by ticks. Contact your health care provider if you develop any of the following in the days or weeks after the tick bite:  Unexplained fever.  Rash. A circular rash that appears days or weeks after the tick bite may indicate the possibility of Lyme disease. The rash may resemble  a target with a bull's-eye and may occur at a different part of your body than the tick bite.  Redness and swelling in the area of the tick bite.   Tender, swollen lymph glands.   Diarrhea.   Weight loss.   Cough.   Fatigue.   Muscle, joint, or bone pain.   Abdominal pain.   Headache.   Lethargy or a change in your level of consciousness.  Difficulty walking or moving your legs.   Numbness in the legs.   Paralysis.  Shortness of breath.   Confusion.   Repeated vomiting.    This information is not intended to replace advice given to you by your health care provider. Make sure you discuss any questions you have with your health  care provider.   Document Released: 09/21/2000 Document Revised: 10/15/2014 Document Reviewed: 03/04/2013 Elsevier Interactive Patient Education 2016 [place] with your primary care doctor if any continued problems. Doxycycline as directed. Do not have direct light exposure to the sun while taking this medication as it could cause blistering.

## 2016-03-16 NOTE — ED Notes (Addendum)
Pt states he had 3 tick bites on his left leg.  Pulled them off, but states on the lower left leg, there is a red raised area that is tender to touch. Pt worried some of the tick is left over in that area.

## 2016-04-06 ENCOUNTER — Telehealth: Payer: Self-pay | Admitting: *Deleted

## 2016-04-06 NOTE — Telephone Encounter (Signed)
Follow up appointment scheduled with PCP.

## 2016-04-06 NOTE — Telephone Encounter (Signed)
Patient questioned if he should have a lab appt in the future.  Pt contact [phone]

## 2016-05-15 ENCOUNTER — Ambulatory Visit (INDEPENDENT_AMBULATORY_CARE_PROVIDER_SITE_OTHER): Payer: Medicare Other | Admitting: Internal Medicine

## 2016-05-15 ENCOUNTER — Encounter: Payer: Self-pay | Admitting: Internal Medicine

## 2016-05-15 ENCOUNTER — Encounter (INDEPENDENT_AMBULATORY_CARE_PROVIDER_SITE_OTHER): Payer: Self-pay

## 2016-05-15 VITALS — BP 136/70 | HR 82 | Temp 98.6°F | Resp 12 | Wt 301.0 lb

## 2016-05-15 DIAGNOSIS — K209 Esophagitis, unspecified without bleeding: Secondary | ICD-10-CM

## 2016-05-15 DIAGNOSIS — M79671 Pain in right foot: Secondary | ICD-10-CM

## 2016-05-15 DIAGNOSIS — D500 Iron deficiency anemia secondary to blood loss (chronic): Secondary | ICD-10-CM

## 2016-05-15 DIAGNOSIS — I10 Essential (primary) hypertension: Secondary | ICD-10-CM | POA: Diagnosis not present

## 2016-05-15 DIAGNOSIS — I739 Peripheral vascular disease, unspecified: Secondary | ICD-10-CM

## 2016-05-15 DIAGNOSIS — Z125 Encounter for screening for malignant neoplasm of prostate: Secondary | ICD-10-CM

## 2016-05-15 DIAGNOSIS — E7800 Pure hypercholesterolemia, unspecified: Secondary | ICD-10-CM

## 2016-05-15 DIAGNOSIS — R635 Abnormal weight gain: Secondary | ICD-10-CM

## 2016-05-15 NOTE — Progress Notes (Signed)
Pre visit review using our clinic review tool, if applicable. No additional management support is needed unless otherwise documented below in the visit note. 

## 2016-05-15 NOTE — Assessment & Plan Note (Signed)
On lovastatin.  Low cholesterol diet and exercise.  Follow lipid panel and liver function tests.   

## 2016-05-15 NOTE — Assessment & Plan Note (Signed)
Blood pressure on recheck improved.  His checks as outlined.  Follow pressures.  Check metabolic panel.  He has eaten today.  Wants to return soon for lab work.

## 2016-05-15 NOTE — Assessment & Plan Note (Signed)
No acid reflux.  EGD 04/08/15 as outlined.

## 2016-05-15 NOTE — Progress Notes (Signed)
Patient ID: Charles Nichols, male   DOB: 1951-01-04, 65 y.o.   MRN: [id number]   Subjective:    Patient ID: Charles Nichols, male    DOB: [birth year], 65 y.o.   MRN: [id number]  HPI  Patient here for a scheduled follow up. States he is doing relatively well.  Is anxious coming in today.  States has been a while since been here and feels a little anxious.  After talking, he feels better.  He monitors his blood pressure.  States in the am in is 140 and then decreases as the day progresses.  No chest pain.  He does walk on his treadmill.  Plans to do more exercise.  No sob.  No acid reflux.  States his bowels are doing well.  No leg pain or cramping with walking.     Past Medical History:  Diagnosis Date  . Arthritis   . History of chicken pox   . Hypertension   . Obesity   . Peripheral vascular disease ([place])   . Peripheral vascular disease [facility])    Past Surgical History:  Procedure Laterality Date  . CIRCUMCISION    . COLONOSCOPY WITH PROPOFOL N/A 04/08/2015   Procedure: COLONOSCOPY WITH PROPOFOL;  Surgeon: Lollie Sails, MD;  Location: [facility] ENDOSCOPY;  Service: Endoscopy;  Laterality: N/A;  . ESOPHAGOGASTRODUODENOSCOPY N/A 04/08/2015   Procedure: ESOPHAGOGASTRODUODENOSCOPY (EGD);  Surgeon: Lollie Sails, MD;  Location: [facility] ENDOSCOPY;  Service: Endoscopy;  Laterality: N/A;  . FEMORAL-POPLITEAL BYPASS GRAFT Bilateral 1997, 2000  . leg stent     pvd   Family History  Problem Relation Age of Onset  . Breast cancer Sister   . Hypertension Sister   . Hypertension Brother   . Breast cancer Daughter    Social History   Social History  . Marital status: Married    Spouse name: N/A  . Number of children: N/A  . Years of education: N/A   Social History Main Topics  . Smoking status: Former Research scientist (life sciences)  . Smokeless tobacco: Never Used  . Alcohol use 0.0 oz/week  . Drug use: No  . Sexual activity: Not Asked   Other Topics Concern  . None   Social History Narrative  . None     Outpatient Encounter Prescriptions as of 05/15/2016  Medication Sig  . acetaminophen (TYLENOL) 325 MG tablet Take 650 mg by mouth every 6 (six) hours as needed for pain.  Marland Kitchen aspirin 81 MG tablet Take 81 mg by mouth daily.  . Cholecalciferol (VITAMIN D3) 2000 UNITS TABS Take by mouth daily.  . felodipine (PLENDIL) 10 MG 24 hr tablet Take 1 tablet (10 mg total) by mouth daily.  . hydrochlorothiazide (HYDRODIURIL) 25 MG tablet Take 1 tablet (25 mg total) by mouth daily.  Marland Kitchen lisinopril (PRINIVIL,ZESTRIL) 20 MG tablet Take 2 tablets (40 mg total) by mouth daily.  Marland Kitchen lovastatin (MEVACOR) 40 MG tablet Take 1 tablet (40 mg total) by mouth at bedtime.  . [DISCONTINUED] doxycycline (VIBRAMYCIN) 100 MG capsule Take 1 capsule (100 mg total) by mouth 2 (two) times daily.  . [DISCONTINUED] mupirocin cream (BACTROBAN) 2 % Apply 1 application topically 2 (two) times daily.  . [DISCONTINUED] pantoprazole (PROTONIX) 40 MG tablet Take 40 mg by mouth daily.   No facility-administered encounter medications on file as of 05/15/2016.     Review of Systems  Constitutional: Negative for appetite change and unexpected weight change.       Discussed diet and exercise.    HENT:  Negative for congestion and sinus pressure.   Respiratory: Negative for cough, chest tightness and shortness of breath.   Cardiovascular: Negative for chest pain, palpitations and leg swelling.  Gastrointestinal: Negative for abdominal pain, diarrhea, nausea and vomiting.  Musculoskeletal: Negative for back pain and joint swelling.       Foot is better.   Skin: Negative for color change and rash.  Neurological: Negative for dizziness, light-headedness and headaches.  Psychiatric/Behavioral: Negative for agitation and dysphoric mood.       Objective:     Blood pressure rechecked by me:  136/70  Physical Exam  Constitutional: He appears well-developed and well-nourished. No distress.  HENT:  Nose: Nose normal.  Mouth/Throat:  Oropharynx is clear and moist.  Neck: Neck supple. No thyromegaly present.  Cardiovascular: Normal rate and regular rhythm.   Pulmonary/Chest: Effort normal and breath sounds normal. No respiratory distress.  Abdominal: Soft. Bowel sounds are normal. There is no tenderness.  Musculoskeletal: He exhibits no edema or tenderness.  Lymphadenopathy:    He has no cervical adenopathy.  Skin: No rash noted. No erythema.  Psychiatric: He has a normal mood and affect. His behavior is normal.    BP (!) 176/62   Pulse 82   Temp 98.6 F (37 C)   Resp 12   Wt (!) 301 lb (136.5 kg)   BMI 37.62 kg/m  Wt Readings from Last 3 Encounters:  05/15/16 (!) 301 lb (136.5 kg)  03/16/16 299 lb (135.6 kg)  10/27/15 294 lb 2 oz (133.4 kg)     Lab Results  Component Value Date   WBC 5.7 05/20/2015   HGB 12.6 (A) 05/20/2015   HCT 38 (A) 05/20/2015   PLT 174 05/20/2015   CHOL 186 05/20/2015   TRIG 98 05/20/2015   HDL 61 05/20/2015   LDLCALC 105 05/20/2015   ALT 16 05/20/2015   AST 20 05/20/2015   NA 141 05/20/2015   K 3.9 05/20/2015   CREATININE 0.8 05/20/2015   BUN 13 05/20/2015   TSH 3.74 05/20/2015   HGBA1C 5.8 11/29/2014       Assessment & Plan:   Problem List Items Addressed This Visit    Anemia    Recheck cbc with next labs.       Esophagitis    No acid reflux.  EGD 04/08/15 as outlined.       Essential hypertension, benign    Blood pressure on recheck improved.  His checks as outlined.  Follow pressures.  Check metabolic panel.  He has eaten today.  Wants to return soon for lab work.        Foot pain, right    Improved.  Plans to walk more.        Hypercholesterolemia    On lovastatin.  Low cholesterol diet and exercise.  Follow lipid panel and liver function tests.        Peripheral vascular disease ([facility])    Overall legs doing well.  No pain.  Follow.  Continue risk factor modification.  Continue daily aspirin.        Weight gain    Weight gain.  Discussed diet and  exercise.  Follow.       Relevant Orders   TSH    Other Visit Diagnoses    Prostate cancer screening    -  Primary   Relevant Orders   PSA, Medicare       Einar Pheasant, MD

## 2016-05-15 NOTE — Assessment & Plan Note (Signed)
Weight gain.  Discussed diet and exercise.  Follow.  

## 2016-05-15 NOTE — Assessment & Plan Note (Signed)
Improved.  Plans to walk more.

## 2016-05-15 NOTE — Assessment & Plan Note (Signed)
Recheck cbc with next labs.   

## 2016-05-15 NOTE — Assessment & Plan Note (Signed)
Overall legs doing well.  No pain.  Follow.  Continue risk factor modification.  Continue daily aspirin.

## 2016-05-30 ENCOUNTER — Other Ambulatory Visit: Payer: Medicare Other

## 2016-06-01 ENCOUNTER — Other Ambulatory Visit (INDEPENDENT_AMBULATORY_CARE_PROVIDER_SITE_OTHER): Payer: Medicare Other

## 2016-06-01 DIAGNOSIS — Z125 Encounter for screening for malignant neoplasm of prostate: Secondary | ICD-10-CM

## 2016-06-01 DIAGNOSIS — R635 Abnormal weight gain: Secondary | ICD-10-CM | POA: Diagnosis not present

## 2016-06-01 DIAGNOSIS — D500 Iron deficiency anemia secondary to blood loss (chronic): Secondary | ICD-10-CM

## 2016-06-01 DIAGNOSIS — E7800 Pure hypercholesterolemia, unspecified: Secondary | ICD-10-CM

## 2016-06-01 DIAGNOSIS — I10 Essential (primary) hypertension: Secondary | ICD-10-CM | POA: Diagnosis not present

## 2016-06-01 LAB — BASIC METABOLIC PANEL
BUN: 7 mg/dL (ref 6–23)
CO2: 27 mEq/L (ref 19–32)
Calcium: 8.9 mg/dL (ref 8.4–10.5)
Chloride: 102 mEq/L (ref 96–112)
Creatinine, Ser: 0.79 mg/dL (ref 0.40–1.50)
GFR: 126.51 mL/min (ref >60.00)
Glucose, Bld: 115 mg/dL — ABNORMAL HIGH (ref 70–99)
POTASSIUM: 3.8 meq/L (ref 3.5–5.1)
SODIUM: 137 meq/L (ref 135–145)

## 2016-06-01 LAB — CBC WITH DIFFERENTIAL/PLATELET
BASOS PCT: 0.4 % (ref 0.0–3.0)
Basophils Absolute: 0 10*3/uL (ref 0.0–0.1)
EOS PCT: 2.6 % (ref 0.0–5.0)
Eosinophils Absolute: 0.2 10*3/uL (ref 0.0–0.7)
HEMATOCRIT: 38.2 % — AB (ref 39.0–52.0)
HEMOGLOBIN: 12.9 g/dL — AB (ref 13.0–17.0)
Lymphocytes Relative: 18.8 % (ref 12.0–46.0)
Lymphs Abs: 1.2 10*3/uL (ref 0.7–4.0)
MCHC: 33.6 g/dL (ref 30.0–36.0)
MCV: 82.1 fl (ref 78.0–100.0)
MONO ABS: 0.6 10*3/uL (ref 0.1–1.0)
MONOS PCT: 10.2 % (ref 3.0–12.0)
Neutro Abs: 4.3 10*3/uL (ref 1.4–7.7)
Neutrophils Relative %: 68 % (ref 43.0–77.0)
Platelets: 154 10*3/uL (ref 150.0–400.0)
RBC: 4.66 Mil/uL (ref 4.22–5.81)
RDW: 15.1 % (ref 11.5–15.5)
WBC: 6.4 10*3/uL (ref 4.0–10.5)

## 2016-06-01 LAB — LIPID PANEL
CHOLESTEROL: 155 mg/dL (ref 0–200)
HDL: 50.4 mg/dL (ref >39.00)
LDL CALC: 70 mg/dL (ref 0–99)
NonHDL: 105.03
TRIGLYCERIDES: 174 mg/dL — AB (ref 0.0–149.0)
Total CHOL/HDL Ratio: 3
VLDL: 34.8 mg/dL (ref 0.0–40.0)

## 2016-06-01 LAB — TSH: TSH: 2.67 u[IU]/mL (ref 0.35–4.50)

## 2016-06-01 LAB — HEPATIC FUNCTION PANEL
ALT: 20 U/L (ref 0–53)
AST: 25 U/L (ref 0–37)
Albumin: 3.8 g/dL (ref 3.5–5.2)
Alkaline Phosphatase: 74 U/L (ref 39–117)
Bilirubin, Direct: 0.2 mg/dL (ref 0.0–0.3)
TOTAL PROTEIN: 6.7 g/dL (ref 6.0–8.3)
Total Bilirubin: 0.7 mg/dL (ref 0.2–1.2)

## 2016-06-01 LAB — PSA, MEDICARE: PSA: 1.21 ng/ml (ref 0.10–4.00)

## 2016-06-01 LAB — FERRITIN: Ferritin: 620.6 ng/mL — ABNORMAL HIGH (ref 22.0–322.0)

## 2016-06-03 ENCOUNTER — Other Ambulatory Visit: Payer: Self-pay | Admitting: Internal Medicine

## 2016-06-03 DIAGNOSIS — R739 Hyperglycemia, unspecified: Secondary | ICD-10-CM

## 2016-06-03 DIAGNOSIS — R7989 Other specified abnormal findings of blood chemistry: Secondary | ICD-10-CM

## 2016-06-12 ENCOUNTER — Other Ambulatory Visit (INDEPENDENT_AMBULATORY_CARE_PROVIDER_SITE_OTHER): Payer: Medicare Other

## 2016-06-12 DIAGNOSIS — R7989 Other specified abnormal findings of blood chemistry: Secondary | ICD-10-CM

## 2016-06-12 DIAGNOSIS — R739 Hyperglycemia, unspecified: Secondary | ICD-10-CM | POA: Diagnosis not present

## 2016-06-12 LAB — IBC PANEL
IRON: 81 ug/dL (ref 42–165)
SATURATION RATIOS: 23.2 % (ref 20.0–50.0)
Transferrin: 249 mg/dL (ref 212.0–360.0)

## 2016-06-12 LAB — FERRITIN: FERRITIN: 840.2 ng/mL — AB (ref 22.0–322.0)

## 2016-06-12 LAB — HEMOGLOBIN A1C: HEMOGLOBIN A1C: 5.5 % (ref 4.6–6.5)

## 2016-06-29 ENCOUNTER — Other Ambulatory Visit: Payer: Self-pay | Admitting: Internal Medicine

## 2016-06-29 DIAGNOSIS — D649 Anemia, unspecified: Secondary | ICD-10-CM

## 2016-06-29 DIAGNOSIS — I739 Peripheral vascular disease, unspecified: Secondary | ICD-10-CM

## 2016-06-29 DIAGNOSIS — R7989 Other specified abnormal findings of blood chemistry: Secondary | ICD-10-CM

## 2016-07-06 ENCOUNTER — Other Ambulatory Visit (INDEPENDENT_AMBULATORY_CARE_PROVIDER_SITE_OTHER): Payer: Medicare Other

## 2016-07-06 DIAGNOSIS — R7989 Other specified abnormal findings of blood chemistry: Secondary | ICD-10-CM | POA: Diagnosis not present

## 2016-07-06 DIAGNOSIS — D649 Anemia, unspecified: Secondary | ICD-10-CM

## 2016-07-06 LAB — CBC WITH DIFFERENTIAL/PLATELET
BASOS PCT: 0.4 % (ref 0.0–3.0)
Basophils Absolute: 0 10*3/uL (ref 0.0–0.1)
EOS ABS: 0.1 10*3/uL (ref 0.0–0.7)
Eosinophils Relative: 1.6 % (ref 0.0–5.0)
HCT: 40.3 % (ref 39.0–52.0)
Hemoglobin: 13.3 g/dL (ref 13.0–17.0)
Lymphocytes Relative: 20.8 % (ref 12.0–46.0)
Lymphs Abs: 1.5 10*3/uL (ref 0.7–4.0)
MCHC: 33 g/dL (ref 30.0–36.0)
MCV: 83.4 fl (ref 78.0–100.0)
MONO ABS: 0.7 10*3/uL (ref 0.1–1.0)
Monocytes Relative: 9.8 % (ref 3.0–12.0)
NEUTROS ABS: 4.8 10*3/uL (ref 1.4–7.7)
Neutrophils Relative %: 67.4 % (ref 43.0–77.0)
PLATELETS: 202 10*3/uL (ref 150.0–400.0)
RBC: 4.83 Mil/uL (ref 4.22–5.81)
RDW: 14.7 % (ref 11.5–15.5)
WBC: 7.1 10*3/uL (ref 4.0–10.5)

## 2016-07-06 LAB — SEDIMENTATION RATE: Sed Rate: 34 mm/hr — ABNORMAL HIGH (ref 0–20)

## 2016-07-06 LAB — C-REACTIVE PROTEIN: CRP: 0.8 mg/dL (ref 0.5–20.0)

## 2016-07-06 LAB — FERRITIN: FERRITIN: 840.1 ng/mL — AB (ref 22.0–322.0)

## 2016-07-19 ENCOUNTER — Other Ambulatory Visit: Payer: Self-pay | Admitting: Internal Medicine

## 2016-07-19 ENCOUNTER — Telehealth: Payer: Self-pay | Admitting: Internal Medicine

## 2016-07-19 NOTE — Progress Notes (Signed)
Opened in error

## 2016-07-19 NOTE — Telephone Encounter (Signed)
-----  Message from Cammie Sickle, MD sent at 07/18/2016  9:56 PM EDT ----- Regarding: RE: question I reviewed the chart; and looks like his ferritin is up at 800 range for no apparent reason; but his iron sats are not alarming high. For sake of completion- it's reasonable to check hemochromatosis genotype. If positive- recommend phlebotomy; if not leave him alone as ferritin is an acute phase reactant. We will be happy to see him in cancer center if you want.  Thanks  Lenetta Quaker ----- Message ----- From: Einar Pheasant, MD Sent: 07/17/2016   4:44 PM To: Cammie Sickle, MD Subject: question                                       This patient had a slightly decreased hemoglobin.  A ferritin level was checked and was significantly elevated.  I subsequently checked his iron levels.  Iron level ok.  ESR and CRP ok.  I am not sure if I need to do anything more regarding the elevated ferritin with him being essentially asymptomatic.  Also, his follow up hemoglobin and kidney function tests were within normal limits.  If you do not mind looking at this and seeing what you think, I would appreciate it.  Just let me know.    Thanks  [employer]

## 2016-07-19 NOTE — Telephone Encounter (Signed)
Pt called back regarding results.  °

## 2016-07-19 NOTE — Telephone Encounter (Signed)
Pat called regarding results. Thank you!  Call pt @ [phone]

## 2016-07-20 ENCOUNTER — Other Ambulatory Visit: Payer: Self-pay | Admitting: Internal Medicine

## 2016-07-24 ENCOUNTER — Other Ambulatory Visit: Payer: Self-pay

## 2016-07-25 ENCOUNTER — Other Ambulatory Visit: Payer: Self-pay | Admitting: Internal Medicine

## 2016-07-25 DIAGNOSIS — R7989 Other specified abnormal findings of blood chemistry: Secondary | ICD-10-CM

## 2016-07-25 NOTE — Progress Notes (Signed)
Order placed for hemochromatosis lab

## 2016-08-02 ENCOUNTER — Other Ambulatory Visit (INDEPENDENT_AMBULATORY_CARE_PROVIDER_SITE_OTHER): Payer: Medicare Other

## 2016-08-02 DIAGNOSIS — R7989 Other specified abnormal findings of blood chemistry: Secondary | ICD-10-CM | POA: Diagnosis not present

## 2016-08-06 LAB — HEMOCHROMATOSIS DNA-PCR(C282Y,H63D)

## 2016-08-22 ENCOUNTER — Ambulatory Visit (INDEPENDENT_AMBULATORY_CARE_PROVIDER_SITE_OTHER): Payer: Medicare Other | Admitting: Internal Medicine

## 2016-08-22 ENCOUNTER — Encounter: Payer: Self-pay | Admitting: Internal Medicine

## 2016-08-22 ENCOUNTER — Other Ambulatory Visit: Payer: Self-pay | Admitting: Internal Medicine

## 2016-08-22 DIAGNOSIS — I10 Essential (primary) hypertension: Secondary | ICD-10-CM

## 2016-08-22 DIAGNOSIS — Z0000 Encounter for general adult medical examination without abnormal findings: Secondary | ICD-10-CM | POA: Diagnosis not present

## 2016-08-22 DIAGNOSIS — I739 Peripheral vascular disease, unspecified: Secondary | ICD-10-CM | POA: Diagnosis not present

## 2016-08-22 DIAGNOSIS — E7800 Pure hypercholesterolemia, unspecified: Secondary | ICD-10-CM

## 2016-08-22 DIAGNOSIS — Z86010 Personal history of colonic polyps: Secondary | ICD-10-CM

## 2016-08-22 DIAGNOSIS — D649 Anemia, unspecified: Secondary | ICD-10-CM

## 2016-08-22 DIAGNOSIS — R635 Abnormal weight gain: Secondary | ICD-10-CM

## 2016-08-22 DIAGNOSIS — Z23 Encounter for immunization: Secondary | ICD-10-CM | POA: Diagnosis not present

## 2016-08-22 NOTE — Progress Notes (Signed)
Patient ID: Charles Nichols, male   DOB: Jan 07, 1951, 65 y.o.   MRN: [id number]   Subjective:    Patient ID: Charles Nichols, male    DOB: 29-Jun-1951, 65 y.o.   MRN: [id number]  HPI  Patient here for his physical exam.  He reports he is doing well.  Tries to stay active.  No chest pain.  Breathing stable.     Past Medical History:  Diagnosis Date  . Arthritis   . History of chicken pox   . Hypertension   . Obesity   . Peripheral vascular disease ([place])   . Peripheral vascular disease [facility])    Past Surgical History:  Procedure Laterality Date  . CIRCUMCISION    . COLONOSCOPY WITH PROPOFOL N/A 04/08/2015   Procedure: COLONOSCOPY WITH PROPOFOL;  Surgeon: Lollie Sails, MD;  Location: [facility] ENDOSCOPY;  Service: Endoscopy;  Laterality: N/A;  . ESOPHAGOGASTRODUODENOSCOPY N/A 04/08/2015   Procedure: ESOPHAGOGASTRODUODENOSCOPY (EGD);  Surgeon: Lollie Sails, MD;  Location: [facility] ENDOSCOPY;  Service: Endoscopy;  Laterality: N/A;  . FEMORAL-POPLITEAL BYPASS GRAFT Bilateral 1997, 2000  . leg stent     pvd   Family History  Problem Relation Age of Onset  . Breast cancer Sister   . Hypertension Sister   . Hypertension Brother   . Breast cancer Daughter    Social History   Social History  . Marital status: Married    Spouse name: N/A  . Number of children: N/A  . Years of education: N/A   Social History Main Topics  . Smoking status: Former Research scientist (life sciences)  . Smokeless tobacco: Never Used  . Alcohol use 0.0 oz/week  . Drug use: No  . Sexual activity: Not Asked   Other Topics Concern  . None   Social History Narrative  . None    Outpatient Encounter Prescriptions as of 08/22/2016  Medication Sig  . acetaminophen (TYLENOL) 325 MG tablet Take 650 mg by mouth every 6 (six) hours as needed for pain.  Marland Kitchen aspirin 81 MG tablet Take 81 mg by mouth daily.  . Cholecalciferol (VITAMIN D3) 2000 UNITS TABS Take by mouth daily.  . felodipine (PLENDIL) 10 MG 24 hr tablet Take 1 tablet (10 mg total)  by mouth daily.  . hydrochlorothiazide (HYDRODIURIL) 25 MG tablet Take 1 tablet (25 mg total) by mouth daily.  Marland Kitchen lisinopril (PRINIVIL,ZESTRIL) 20 MG tablet Take 2 tablets (40 mg total) by mouth daily.  Marland Kitchen lovastatin (MEVACOR) 40 MG tablet Take 1 tablet (40 mg total) by mouth at bedtime.  . [DISCONTINUED] hydrochlorothiazide (HYDRODIURIL) 25 MG tablet Take 1 tablet (25 mg total) by mouth daily.   No facility-administered encounter medications on file as of 08/22/2016.     Review of Systems     Objective:    Physical Exam  Constitutional: He is oriented to person, place, and time. He appears well-developed and well-nourished. No distress.  HENT:  Head: Normocephalic and atraumatic.  Nose: Nose normal.  Mouth/Throat: Oropharynx is clear and moist. No oropharyngeal exudate.  Eyes: Conjunctivae are normal. Right eye exhibits no discharge. Left eye exhibits no discharge.  Neck: Neck supple. No thyromegaly present.  Cardiovascular: Normal rate and regular rhythm.   Pulmonary/Chest: Breath sounds normal. No respiratory distress. He has no wheezes.  Abdominal: Soft. Bowel sounds are normal. There is no tenderness.  Genitourinary:  Genitourinary Comments: Rectal exam - no palpable prostate nodules.  Heme negative.   Musculoskeletal: He exhibits no edema or tenderness.  Lymphadenopathy:    He  has no cervical adenopathy.  Neurological: He is alert and oriented to person, place, and time.  Skin: Skin is warm and dry. No rash noted. No erythema.  Psychiatric: He has a normal mood and affect. His behavior is normal.    BP 138/82   Pulse 86   Temp 98 F (36.7 C) (Oral)   Ht 6\' 2"  (1.88 m)   Wt 300 lb (136.1 kg)   SpO2 95%   BMI 38.52 kg/m  Wt Readings from Last 3 Encounters:  08/22/16 300 lb (136.1 kg)  05/15/16 (!) 301 lb (136.5 kg)  03/16/16 299 lb (135.6 kg)     Lab Results  Component Value Date   WBC 7.1 07/06/2016   HGB 13.3 07/06/2016   HCT 40.3 07/06/2016   PLT 202.0  07/06/2016   GLUCOSE 115 (H) 06/01/2016   CHOL 155 06/01/2016   TRIG 174.0 (H) 06/01/2016   HDL 50.40 06/01/2016   LDLCALC 70 06/01/2016   ALT 20 06/01/2016   AST 25 06/01/2016   NA 137 06/01/2016   K 3.8 06/01/2016   CL 102 06/01/2016   CREATININE 0.79 06/01/2016   BUN 7 06/01/2016   CO2 27 06/01/2016   TSH 2.67 06/01/2016   PSA 1.21 06/01/2016   HGBA1C 5.5 06/12/2016       Assessment & Plan:   Problem List Items Addressed This Visit    Anemia    hgb 07/06/16 - wnl.        Essential hypertension, benign    Blood pressure as outlined.  Continue same medication regimen.  Follow pressures.  Follow metabolic panel.        Relevant Medications   hydrochlorothiazide (HYDRODIURIL) 25 MG tablet   Other Relevant Orders   Basic metabolic panel   Health care maintenance    Physical today 08/22/16.  Colonoscopy 04/08/15 - diverticulosis and <50mm polyp.  Recommended f/u in 5 years.  PSA 06/01/16 - 1.21.        History of colonic polyps    Colonoscopy 04/08/15 as outlined.  Recommended f/u colonoscopy in 5 years.        Hypercholesterolemia    On lovastatin.  Low cholesterol diet and exercise.  Follow lipid panel and liver function tests.        Relevant Medications   hydrochlorothiazide (HYDRODIURIL) 25 MG tablet   Other Relevant Orders   Hepatic function panel   Lipid panel   Peripheral vascular disease ([place])    Reports some pain intermittently, but overall feels things are stable.  He desires no further w/up.  Follow.  Continue risk factor modification.        Relevant Medications   hydrochlorothiazide (HYDRODIURIL) 25 MG tablet   Weight gain    Discussed diet and exercise.  Follow.  Stable from last check.         Other Visit Diagnoses    Encounter for immunization       Relevant Orders   Flu vaccine HIGH DOSE PF (Completed)       Einar Pheasant, MD

## 2016-08-22 NOTE — Assessment & Plan Note (Addendum)
Physical today 08/22/16.  Colonoscopy 04/08/15 - diverticulosis and <47mm polyp.  Recommended f/u in 5 years.  PSA 06/01/16 - 1.21.

## 2016-08-22 NOTE — Progress Notes (Signed)
Pre visit review using our clinic review tool, if applicable. No additional management support is needed unless otherwise documented below in the visit note. 

## 2016-09-02 ENCOUNTER — Encounter: Payer: Self-pay | Admitting: Internal Medicine

## 2016-09-02 MED ORDER — HYDROCHLOROTHIAZIDE 25 MG PO TABS: 25.0000 mg | ORAL_TABLET | Freq: Every day | ORAL | 3 refills | Status: DC

## 2016-09-02 NOTE — Assessment & Plan Note (Signed)
Blood pressure as outlined.  Continue same medication regimen.  Follow pressures.  Follow metabolic panel.  

## 2016-09-02 NOTE — Assessment & Plan Note (Signed)
On lovastatin.  Low cholesterol diet and exercise.  Follow lipid panel and liver function tests.   

## 2016-09-02 NOTE — Assessment & Plan Note (Signed)
Reports some pain intermittently, but overall feels things are stable.  He desires no further w/up.  Follow.  Continue risk factor modification.

## 2016-09-02 NOTE — Assessment & Plan Note (Signed)
Colonoscopy 04/08/15 as outlined.  Recommended f/u colonoscopy in 5 years.

## 2016-09-02 NOTE — Assessment & Plan Note (Signed)
Discussed diet and exercise.  Follow.  Stable from last check.

## 2016-09-02 NOTE — Assessment & Plan Note (Signed)
hgb 07/06/16 - wnl.

## 2016-10-02 ENCOUNTER — Other Ambulatory Visit: Payer: Self-pay | Admitting: Internal Medicine

## 2016-10-16 ENCOUNTER — Telehealth: Payer: Self-pay | Admitting: Internal Medicine

## 2016-10-16 DIAGNOSIS — S90821A Blister (nonthermal), right foot, initial encounter: Secondary | ICD-10-CM | POA: Diagnosis not present

## 2016-10-16 NOTE — Telephone Encounter (Signed)
Patient advised of below and verbalized an understanding . He is going to urgent care [facility] to get evaluated tonight.

## 2016-10-16 NOTE — Telephone Encounter (Signed)
Pt called and stated that he has a blister developing on his right foot and he is concerned because of it is the leg and foot that he has had the bypass on. He is having difficulties walk on it. Pt is couldn't clearly verbalize what was going on. Please advise, thank you!  Call pt @ [phone]

## 2016-10-16 NOTE — Telephone Encounter (Signed)
Given history of vascular issues, would recommend going ahead and having evaluated to confirm no infection.  Need to know for sure what treating.  Recommend acute care this pm.

## 2016-10-16 NOTE — Telephone Encounter (Signed)
Reason for call:blister right foot near 5 th toe   Symptoms: blister  ,not draining, no redness, thinks its coming from where legs are bowing outward  Duration Sunday Not diabetic  Please advise

## 2016-11-21 ENCOUNTER — Other Ambulatory Visit (INDEPENDENT_AMBULATORY_CARE_PROVIDER_SITE_OTHER): Payer: Medicare Other

## 2016-11-21 DIAGNOSIS — E7800 Pure hypercholesterolemia, unspecified: Secondary | ICD-10-CM

## 2016-11-21 DIAGNOSIS — I10 Essential (primary) hypertension: Secondary | ICD-10-CM

## 2016-11-21 LAB — LIPID PANEL
Cholesterol: 138 mg/dL (ref 0–200)
HDL: 45.1 mg/dL (ref >39.00)
LDL CALC: 68 mg/dL (ref 0–99)
NONHDL: 93.11
Total CHOL/HDL Ratio: 3
Triglycerides: 128 mg/dL (ref 0.0–149.0)
VLDL: 25.6 mg/dL (ref 0.0–40.0)

## 2016-11-21 LAB — HEPATIC FUNCTION PANEL
ALK PHOS: 67 U/L (ref 39–117)
ALT: 28 U/L (ref 0–53)
AST: 28 U/L (ref 0–37)
Albumin: 4.3 g/dL (ref 3.5–5.2)
BILIRUBIN TOTAL: 0.4 mg/dL (ref 0.2–1.2)
Bilirubin, Direct: 0.1 mg/dL (ref 0.0–0.3)
Total Protein: 7.4 g/dL (ref 6.0–8.3)

## 2016-11-21 LAB — BASIC METABOLIC PANEL
BUN: 14 mg/dL (ref 6–23)
CALCIUM: 9.9 mg/dL (ref 8.4–10.5)
CO2: 30 mEq/L (ref 19–32)
Chloride: 103 mEq/L (ref 96–112)
Creatinine, Ser: 0.89 mg/dL (ref 0.40–1.50)
GFR: 110.09 mL/min (ref >60.00)
Glucose, Bld: 114 mg/dL — ABNORMAL HIGH (ref 70–99)
POTASSIUM: 4 meq/L (ref 3.5–5.1)
SODIUM: 137 meq/L (ref 135–145)

## 2016-11-23 ENCOUNTER — Ambulatory Visit (INDEPENDENT_AMBULATORY_CARE_PROVIDER_SITE_OTHER): Payer: Medicare Other | Admitting: Internal Medicine

## 2016-11-23 ENCOUNTER — Encounter: Payer: Self-pay | Admitting: Internal Medicine

## 2016-11-23 DIAGNOSIS — M21371 Foot drop, right foot: Secondary | ICD-10-CM

## 2016-11-23 DIAGNOSIS — I739 Peripheral vascular disease, unspecified: Secondary | ICD-10-CM | POA: Diagnosis not present

## 2016-11-23 DIAGNOSIS — E7800 Pure hypercholesterolemia, unspecified: Secondary | ICD-10-CM | POA: Diagnosis not present

## 2016-11-23 DIAGNOSIS — I10 Essential (primary) hypertension: Secondary | ICD-10-CM

## 2016-11-23 NOTE — Progress Notes (Signed)
Patient ID: Charles Nichols, male   DOB: March 12, 1951, 66 y.o.   MRN: [id number]   Subjective:    Patient ID: Charles Nichols, male    DOB: May 11, 1951, 66 y.o.   MRN: [id number]  HPI  Patient here for a scheduled follow up.  He has been having problems with his right leg.  After discussing with him, it appears to be more of a problem with his gait.  Some discomfort in his right hip, but no back pain.  No numbness or tingling.  When he walks, he feels his foot "flops" some.  He states this has occurred gradually.  States he is otherwise doing well.  No chest pain.  No sob.  No acid reflux.  No abdominal pain or cramping.  Bowels stable.  He has been watching his diet.  Not able to exercise as much.    Past Medical History:  Diagnosis Date  . Arthritis   . History of chicken pox   . Hypertension   . Obesity   . Peripheral vascular disease ([place])   . Peripheral vascular disease [facility])    Past Surgical History:  Procedure Laterality Date  . CIRCUMCISION    . COLONOSCOPY WITH PROPOFOL N/A 04/08/2015   Procedure: COLONOSCOPY WITH PROPOFOL;  Surgeon: Lollie Sails, MD;  Location: [facility] ENDOSCOPY;  Service: Endoscopy;  Laterality: N/A;  . ESOPHAGOGASTRODUODENOSCOPY N/A 04/08/2015   Procedure: ESOPHAGOGASTRODUODENOSCOPY (EGD);  Surgeon: Lollie Sails, MD;  Location: [facility] ENDOSCOPY;  Service: Endoscopy;  Laterality: N/A;  . FEMORAL-POPLITEAL BYPASS GRAFT Bilateral 1997, 2000  . leg stent     pvd   Family History  Problem Relation Age of Onset  . Breast cancer Sister   . Hypertension Sister   . Hypertension Brother   . Breast cancer Daughter    Social History   Social History  . Marital status: Married    Spouse name: N/A  . Number of children: N/A  . Years of education: N/A   Social History Main Topics  . Smoking status: Former Research scientist (life sciences)  . Smokeless tobacco: Never Used  . Alcohol use 0.0 oz/week  . Drug use: No  . Sexual activity: Not Asked   Other Topics Concern  . None   Social  History Narrative  . None    Outpatient Encounter Prescriptions as of 11/23/2016  Medication Sig  . acetaminophen (TYLENOL) 325 MG tablet Take 650 mg by mouth every 6 (six) hours as needed for pain.  Marland Kitchen aspirin 81 MG tablet Take 81 mg by mouth daily.  . Cholecalciferol (VITAMIN D3) 2000 UNITS TABS Take by mouth daily.  . felodipine (PLENDIL) 10 MG 24 hr tablet Take 1 tablet (10 mg total) by mouth daily.  . felodipine (PLENDIL) 10 MG 24 hr tablet TAKE ONE TABLET BY MOUTH ONCE DAILY  . hydrochlorothiazide (HYDRODIURIL) 25 MG tablet Take 1 tablet (25 mg total) by mouth daily.  Marland Kitchen lisinopril (PRINIVIL,ZESTRIL) 20 MG tablet Take 2 tablets (40 mg total) by mouth daily.  Marland Kitchen lisinopril (PRINIVIL,ZESTRIL) 20 MG tablet TAKE TWO TABLETS BY MOUTH ONCE DAILY  . lovastatin (MEVACOR) 40 MG tablet Take 1 tablet (40 mg total) by mouth at bedtime.   No facility-administered encounter medications on file as of 11/23/2016.     Review of Systems  Constitutional: Negative for appetite change and unexpected weight change.  HENT: Negative for congestion and sinus pressure.   Respiratory: Negative for cough, chest tightness and shortness of breath.   Cardiovascular: Negative for chest pain  and palpitations.  Gastrointestinal: Negative for abdominal pain, diarrhea, nausea and vomiting.  Genitourinary: Negative for difficulty urinating and dysuria.  Musculoskeletal: Negative for myalgias.       Right leg and hip discomfort.    Skin: Negative for color change and rash.  Neurological: Negative for dizziness, light-headedness and headaches.  Psychiatric/Behavioral: Negative for agitation and dysphoric mood.       Objective:     Blood pressure rechecked by me:  136/74  Physical Exam  Constitutional: He appears well-developed and well-nourished. No distress.  HENT:  Nose: Nose normal.  Mouth/Throat: Oropharynx is clear and moist.  Neck: Neck supple. No thyromegaly present.  Cardiovascular: Normal rate and  regular rhythm.   Pulmonary/Chest: Effort normal and breath sounds normal. No respiratory distress.  Abdominal: Soft. Bowel sounds are normal. There is no tenderness.  Musculoskeletal:  Appears to have right foot drop.  Decreased dorsiflexion - right.  No pain with SLR.  No pain with abduction or adduction - of right leg.    Lymphadenopathy:    He has no cervical adenopathy.  Psychiatric: He has a normal mood and affect. His behavior is normal.    BP 136/74   Pulse 79   Temp 98.3 F (36.8 C) (Oral)   Ht 6\' 2"  (1.88 m)   Wt (!) 302 lb 6.4 oz (137.2 kg)   SpO2 97%   BMI 38.83 kg/m  Wt Readings from Last 3 Encounters:  11/23/16 (!) 302 lb 6.4 oz (137.2 kg)  08/22/16 300 lb (136.1 kg)  05/15/16 (!) 301 lb (136.5 kg)     Lab Results  Component Value Date   WBC 7.1 07/06/2016   HGB 13.3 07/06/2016   HCT 40.3 07/06/2016   PLT 202.0 07/06/2016   GLUCOSE 114 (H) 11/21/2016   CHOL 138 11/21/2016   TRIG 128.0 11/21/2016   HDL 45.10 11/21/2016   LDLCALC 68 11/21/2016   ALT 28 11/21/2016   AST 28 11/21/2016   NA 137 11/21/2016   K 4.0 11/21/2016   CL 103 11/21/2016   CREATININE 0.89 11/21/2016   BUN 14 11/21/2016   CO2 30 11/21/2016   TSH 2.67 06/01/2016   PSA 1.21 06/01/2016   HGBA1C 5.5 06/12/2016       Assessment & Plan:   Problem List Items Addressed This Visit    Essential hypertension, benign    Blood pressure under good control.  Continue same medication regimen.  Follow pressures.  Follow metabolic panel.        Hypercholesterolemia    On lovastatin.  Low cholesterol diet and exercise.  Follow lipid panel and liver function tests.        Peripheral vascular disease ([place] [address])    Discussed referral for ABIs.  Wants to hold at this time.  Continue risk factor modification.  Follow.       Right foot drop    With symptoms and exam as outlined.  Appears to have right foot drop.  Unclear etiology.  No numbness or tingling.  Discussed further w/up.  Discussed MRI.   Wants to hold.  Discussed neurology referral for further w/up.  He agrees.        Relevant Orders   Ambulatory referral to Neurology       Einar Pheasant, MD

## 2016-11-23 NOTE — Progress Notes (Signed)
Pre visit review using our clinic review tool, if applicable. No additional management support is needed unless otherwise documented below in the visit note. 

## 2016-11-24 DIAGNOSIS — M21371 Foot drop, right foot: Secondary | ICD-10-CM | POA: Insufficient documentation

## 2016-11-24 NOTE — Assessment & Plan Note (Signed)
Blood pressure under good control.  Continue same medication regimen.  Follow pressures.  Follow metabolic panel.   

## 2016-11-24 NOTE — Assessment & Plan Note (Signed)
With symptoms and exam as outlined.  Appears to have right foot drop.  Unclear etiology.  No numbness or tingling.  Discussed further w/up.  Discussed MRI.  Wants to hold.  Discussed neurology referral for further w/up.  He agrees.

## 2016-11-24 NOTE — Assessment & Plan Note (Signed)
On lovastatin.  Low cholesterol diet and exercise.  Follow lipid panel and liver function tests.   

## 2016-11-24 NOTE — Assessment & Plan Note (Signed)
Discussed referral for ABIs.  Wants to hold at this time.  Continue risk factor modification.  Follow.

## 2016-12-12 DIAGNOSIS — M21371 Foot drop, right foot: Secondary | ICD-10-CM | POA: Diagnosis not present

## 2016-12-12 DIAGNOSIS — R269 Unspecified abnormalities of gait and mobility: Secondary | ICD-10-CM | POA: Diagnosis not present

## 2016-12-12 DIAGNOSIS — Z6837 Body mass index (BMI) 37.0-37.9, adult: Secondary | ICD-10-CM | POA: Diagnosis not present

## 2016-12-27 DIAGNOSIS — E538 Deficiency of other specified B group vitamins: Secondary | ICD-10-CM | POA: Diagnosis not present

## 2017-01-03 DIAGNOSIS — E538 Deficiency of other specified B group vitamins: Secondary | ICD-10-CM | POA: Diagnosis not present

## 2017-01-07 ENCOUNTER — Other Ambulatory Visit: Payer: Self-pay | Admitting: Internal Medicine

## 2017-01-10 DIAGNOSIS — E538 Deficiency of other specified B group vitamins: Secondary | ICD-10-CM | POA: Diagnosis not present

## 2017-01-17 DIAGNOSIS — E538 Deficiency of other specified B group vitamins: Secondary | ICD-10-CM | POA: Diagnosis not present

## 2017-01-25 ENCOUNTER — Ambulatory Visit (INDEPENDENT_AMBULATORY_CARE_PROVIDER_SITE_OTHER): Payer: Medicare Other | Admitting: Internal Medicine

## 2017-01-25 ENCOUNTER — Encounter: Payer: Self-pay | Admitting: Internal Medicine

## 2017-01-25 DIAGNOSIS — I10 Essential (primary) hypertension: Secondary | ICD-10-CM | POA: Diagnosis not present

## 2017-01-25 DIAGNOSIS — I739 Peripheral vascular disease, unspecified: Secondary | ICD-10-CM | POA: Diagnosis not present

## 2017-01-25 DIAGNOSIS — E7800 Pure hypercholesterolemia, unspecified: Secondary | ICD-10-CM | POA: Diagnosis not present

## 2017-01-25 DIAGNOSIS — M21371 Foot drop, right foot: Secondary | ICD-10-CM

## 2017-01-25 NOTE — Progress Notes (Signed)
Pre-visit discussion using our clinic review tool. No additional management support is needed unless otherwise documented below in the visit note.  

## 2017-01-25 NOTE — Progress Notes (Signed)
Patient ID: Charles Nichols, male   DOB: March 31, 1951, 66 y.o.   MRN: [id number]   Subjective:    Patient ID: Charles Nichols, male    DOB: 12/11/1950, 66 y.o.   MRN: [id number]  HPI  Patient here for a scheduled follow up.  He saw Dr Manuella Ghazi. Foot drop.  Undergoing w/up.  Receiving B12 injections.  Has f/u planned.  No pain in leg.  No chest pain.  No sob.  No acid reflux.  No abdominal pain.  Bowels stable.      Past Medical History:  Diagnosis Date  . Arthritis   . History of chicken pox   . Hypertension   . Obesity   . Peripheral vascular disease ([place])   . Peripheral vascular disease [facility])    Past Surgical History:  Procedure Laterality Date  . CIRCUMCISION    . COLONOSCOPY WITH PROPOFOL N/A 04/08/2015   Procedure: COLONOSCOPY WITH PROPOFOL;  Surgeon: Lollie Sails, MD;  Location: [facility] ENDOSCOPY;  Service: Endoscopy;  Laterality: N/A;  . ESOPHAGOGASTRODUODENOSCOPY N/A 04/08/2015   Procedure: ESOPHAGOGASTRODUODENOSCOPY (EGD);  Surgeon: Lollie Sails, MD;  Location: [facility] ENDOSCOPY;  Service: Endoscopy;  Laterality: N/A;  . FEMORAL-POPLITEAL BYPASS GRAFT Bilateral 1997, 2000  . leg stent     pvd   Family History  Problem Relation Age of Onset  . Breast cancer Sister   . Hypertension Sister   . Hypertension Brother   . Breast cancer Daughter    Social History   Social History  . Marital status: Married    Spouse name: N/A  . Number of children: N/A  . Years of education: N/A   Social History Main Topics  . Smoking status: Former Research scientist (life sciences)  . Smokeless tobacco: Never Used  . Alcohol use 0.0 oz/week  . Drug use: No  . Sexual activity: Not Asked   Other Topics Concern  . None   Social History Narrative  . None    Outpatient Encounter Prescriptions as of 01/25/2017  Medication Sig  . acetaminophen (TYLENOL) 325 MG tablet Take 650 mg by mouth every 6 (six) hours as needed for pain.  Marland Kitchen aspirin 81 MG tablet Take 81 mg by mouth daily.  . Cholecalciferol (VITAMIN D3) 2000  UNITS TABS Take by mouth daily.  . felodipine (PLENDIL) 10 MG 24 hr tablet Take 1 tablet (10 mg total) by mouth daily.  . felodipine (PLENDIL) 10 MG 24 hr tablet TAKE ONE TABLET BY MOUTH ONCE DAILY  . hydrochlorothiazide (HYDRODIURIL) 25 MG tablet Take 1 tablet (25 mg total) by mouth daily.  Marland Kitchen lisinopril (PRINIVIL,ZESTRIL) 20 MG tablet Take 2 tablets (40 mg total) by mouth daily.  Marland Kitchen lisinopril (PRINIVIL,ZESTRIL) 20 MG tablet TAKE TWO TABLETS BY MOUTH ONCE DAILY  . lovastatin (MEVACOR) 40 MG tablet TAKE ONE TABLET BY MOUTH ONCE DAILY  . vitamin B-12 (CYANOCOBALAMIN) 1000 MCG tablet Take 1,000 mcg by mouth daily.   No facility-administered encounter medications on file as of 01/25/2017.     Review of Systems  Constitutional: Negative for appetite change and unexpected weight change.  HENT: Negative for congestion and sinus pressure.   Respiratory: Negative for cough, chest tightness and shortness of breath.   Cardiovascular: Negative for chest pain, palpitations and leg swelling.  Gastrointestinal: Negative for abdominal pain, diarrhea, nausea and vomiting.  Genitourinary: Negative for difficulty urinating and dysuria.  Musculoskeletal: Negative for joint swelling.       No leg pain  Skin: Negative for color change and rash.  Neurological: Negative for dizziness, light-headedness and headaches.  Psychiatric/Behavioral: Negative for agitation and dysphoric mood.       Objective:    Physical Exam  Constitutional: He appears well-developed and well-nourished. No distress.  HENT:  Nose: Nose normal.  Mouth/Throat: Oropharynx is clear and moist.  Neck: Neck supple. No thyromegaly present.  Cardiovascular: Normal rate and regular rhythm.   Pulmonary/Chest: Effort normal and breath sounds normal. No respiratory distress.  Abdominal: Soft. Bowel sounds are normal. There is no tenderness.  Musculoskeletal: He exhibits no edema or tenderness.  Lymphadenopathy:    He has no cervical  adenopathy.  Skin: No rash noted. No erythema.  Psychiatric: He has a normal mood and affect. His behavior is normal.    BP 130/78 (BP Location: Left Arm, Patient Position: Sitting, Cuff Size: Large)   Pulse 86   Temp 98.1 F (36.7 C) (Oral)   Resp 14   Ht 6\' 2"  (1.88 m)   Wt (!) 301 lb 6.4 oz (136.7 kg)   SpO2 95%   BMI 38.70 kg/m  Wt Readings from Last 3 Encounters:  01/25/17 (!) 301 lb 6.4 oz (136.7 kg)  11/23/16 (!) 302 lb 6.4 oz (137.2 kg)  08/22/16 300 lb (136.1 kg)     Lab Results  Component Value Date   WBC 7.1 07/06/2016   HGB 13.3 07/06/2016   HCT 40.3 07/06/2016   PLT 202.0 07/06/2016   GLUCOSE 114 (H) 11/21/2016   CHOL 138 11/21/2016   TRIG 128.0 11/21/2016   HDL 45.10 11/21/2016   LDLCALC 68 11/21/2016   ALT 28 11/21/2016   AST 28 11/21/2016   NA 137 11/21/2016   K 4.0 11/21/2016   CL 103 11/21/2016   CREATININE 0.89 11/21/2016   BUN 14 11/21/2016   CO2 30 11/21/2016   TSH 2.67 06/01/2016   PSA 1.21 06/01/2016   HGBA1C 5.5 06/12/2016       Assessment & Plan:   Problem List Items Addressed This Visit    Essential hypertension, benign    Blood pressure elevated on my check.  Recheck improved.  Have him follow his pressures.  Same medication regimen.  Follow metabolic panel.       Hypercholesterolemia    On lovastatin.  Low cholesterol diet and exercise.  Follow lipid panel and liver function tests.        Peripheral vascular disease ([place])    Wanted to hold on ABIs.  Continue risk factor modification.        Right foot drop    Undergoing w/up for right foot drop as outlined.  Receiving B12 injections.  Has f/u planned with neurology.  Discussed possible need for brace.            Einar Pheasant, MD

## 2017-01-27 ENCOUNTER — Encounter: Payer: Self-pay | Admitting: Internal Medicine

## 2017-01-27 NOTE — Assessment & Plan Note (Signed)
Blood pressure elevated on my check.  Recheck improved.  Have him follow his pressures.  Same medication regimen.  Follow metabolic panel.

## 2017-01-27 NOTE — Assessment & Plan Note (Signed)
Wanted to hold on ABIs.  Continue risk factor modification.

## 2017-01-27 NOTE — Assessment & Plan Note (Signed)
On lovastatin.  Low cholesterol diet and exercise.  Follow lipid panel and liver function tests.   

## 2017-01-27 NOTE — Assessment & Plan Note (Signed)
Undergoing w/up for right foot drop as outlined.  Receiving B12 injections.  Has f/u planned with neurology.  Discussed possible need for brace.

## 2017-02-14 DIAGNOSIS — E538 Deficiency of other specified B group vitamins: Secondary | ICD-10-CM | POA: Diagnosis not present

## 2017-02-21 ENCOUNTER — Telehealth: Payer: Self-pay | Admitting: Internal Medicine

## 2017-02-21 NOTE — Telephone Encounter (Signed)
Patient advised of below.  He states that test were unable to be performed due to only decreased pulses.  I again advised him that he needed to be evaluated due to swelling bilateral legs, to confirm nothing more acute going on.   Advised him Dr Nicki Reaper out of office tomorrow and beginning of next week.   I advised him that he should treatment at urgent care today and not wait to get treatment .   Patient was very frustrated and will seek treatment .

## 2017-02-21 NOTE — Telephone Encounter (Signed)
Was scheduled for nerve conduction test at [facility] at today unable to get pulse due to both feet swollen coming up legs .  Described as pitting edema.  Overnight swelling goes down.   Left leg is worse than right leg most swelling in feet couldn't feel good pulse.      No pain in leg. Patient reports taking fluid pill HCTZ in am , takes felodipine in pm.   Please advise.

## 2017-02-21 NOTE — Telephone Encounter (Signed)
Sharyn Lull from [facility] called and stated that pt was in the office for a nerve conduction test but unable to do due to pitting edema. Pt needs to be schedule. Please advise, thank you!

## 2017-02-21 NOTE — Telephone Encounter (Signed)
With increased swelling (pitting edema) and no pulses in feet, he needs to be evaluated.  I am not here a few days over the next week - starting tomorrow.  Needs to be evaluated to confirm nothing more acute going on.  Has seen vascular surgery previously, but recommend acute care evaluation to confirm nothing more acute and then can f/u with vascular surgery if needed.

## 2017-02-22 ENCOUNTER — Other Ambulatory Visit
Admission: RE | Admit: 2017-02-22 | Discharge: 2017-02-22 | Disposition: A | Discharge status: Home / Self Care | Payer: Medicare Other | Source: Ambulatory Visit | Attending: Family Medicine | Admitting: Family Medicine

## 2017-02-22 DIAGNOSIS — R600 Localized edema: Secondary | ICD-10-CM | POA: Diagnosis not present

## 2017-02-22 DIAGNOSIS — I872 Venous insufficiency (chronic) (peripheral): Secondary | ICD-10-CM | POA: Diagnosis not present

## 2017-02-22 LAB — BRAIN NATRIURETIC PEPTIDE: B Natriuretic Peptide: 17 pg/mL (ref 0.0–100.0)

## 2017-03-14 DIAGNOSIS — E538 Deficiency of other specified B group vitamins: Secondary | ICD-10-CM | POA: Diagnosis not present

## 2017-03-25 DIAGNOSIS — E538 Deficiency of other specified B group vitamins: Secondary | ICD-10-CM | POA: Diagnosis not present

## 2017-03-25 DIAGNOSIS — M21371 Foot drop, right foot: Secondary | ICD-10-CM | POA: Diagnosis not present

## 2017-03-25 DIAGNOSIS — Z6837 Body mass index (BMI) 37.0-37.9, adult: Secondary | ICD-10-CM | POA: Diagnosis not present

## 2017-03-25 DIAGNOSIS — R269 Unspecified abnormalities of gait and mobility: Secondary | ICD-10-CM | POA: Diagnosis not present

## 2017-04-11 ENCOUNTER — Encounter (INDEPENDENT_AMBULATORY_CARE_PROVIDER_SITE_OTHER): Payer: Self-pay

## 2017-04-12 ENCOUNTER — Ambulatory Visit (INDEPENDENT_AMBULATORY_CARE_PROVIDER_SITE_OTHER): Payer: Medicare Other | Admitting: Internal Medicine

## 2017-04-12 ENCOUNTER — Encounter: Payer: Self-pay | Admitting: Internal Medicine

## 2017-04-12 VITALS — BP 132/78 | HR 72 | Temp 98.3°F | Resp 12 | Ht 74.0 in | Wt 306.2 lb

## 2017-04-12 DIAGNOSIS — M21371 Foot drop, right foot: Secondary | ICD-10-CM | POA: Diagnosis not present

## 2017-04-12 DIAGNOSIS — I10 Essential (primary) hypertension: Secondary | ICD-10-CM | POA: Diagnosis not present

## 2017-04-12 DIAGNOSIS — Z23 Encounter for immunization: Secondary | ICD-10-CM

## 2017-04-12 DIAGNOSIS — E7800 Pure hypercholesterolemia, unspecified: Secondary | ICD-10-CM

## 2017-04-12 DIAGNOSIS — I739 Peripheral vascular disease, unspecified: Secondary | ICD-10-CM

## 2017-04-12 MED ORDER — FELODIPINE ER 10 MG PO TB24: 10.0000 mg | ORAL_TABLET | Freq: Every day | ORAL | 1 refills | Status: DC

## 2017-04-12 MED ORDER — LISINOPRIL 20 MG PO TABS: 40.0000 mg | ORAL_TABLET | Freq: Every day | ORAL | 3 refills | Status: DC

## 2017-04-12 MED ORDER — CLOBETASOL PROPIONATE 0.05 % EX CREA: 1.0000 "application " | TOPICAL_CREAM | Freq: Two times a day (BID) | CUTANEOUS | 0 refills | Status: DC

## 2017-04-12 NOTE — Progress Notes (Signed)
Patient ID: Charles Nichols, male   DOB: 1951/04/06, 66 y.o.   MRN: [id number]   Subjective:    Patient ID: Charles Nichols, male    DOB: Jan 23, 1951, 66 y.o.   MRN: [id number]  HPI  Patient here for a scheduled follow up.  He is seeing neurology.  Last evaluated 03/25/17.  Taking daily b12.  Has right foot drop.  He does feel he is walking some better.  Planning for vascular f/u (ultrasound) 04/24/17.  Also scheduled for NCS 05/15/17.  Due to see Dr Cleda Mccreedy 04/22/17.  Wearing compression hose.  Has helped significantly with swelling.  Discussed diet and exercise.  States he is trying to watch his diet.  No chest pain.  No sob.  No acid reflux.  No abdominal pain.  Bowels moving.     Past Medical History:  Diagnosis Date  . Arthritis   . History of chicken pox   . Hypertension   . Obesity   . Peripheral vascular disease ([place])   . Peripheral vascular disease [facility])    Past Surgical History:  Procedure Laterality Date  . CIRCUMCISION    . COLONOSCOPY WITH PROPOFOL N/A 04/08/2015   Procedure: COLONOSCOPY WITH PROPOFOL;  Surgeon: Lollie Sails, MD;  Location: [facility] ENDOSCOPY;  Service: Endoscopy;  Laterality: N/A;  . ESOPHAGOGASTRODUODENOSCOPY N/A 04/08/2015   Procedure: ESOPHAGOGASTRODUODENOSCOPY (EGD);  Surgeon: Lollie Sails, MD;  Location: [facility] ENDOSCOPY;  Service: Endoscopy;  Laterality: N/A;  . FEMORAL-POPLITEAL BYPASS GRAFT Bilateral 1997, 2000  . leg stent     pvd   Family History  Problem Relation Age of Onset  . Breast cancer Sister   . Hypertension Sister   . Hypertension Brother   . Breast cancer Daughter    Social History   Social History  . Marital status: Married    Spouse name: N/A  . Number of children: N/A  . Years of education: N/A   Social History Main Topics  . Smoking status: Former Research scientist (life sciences)  . Smokeless tobacco: Never Used  . Alcohol use 0.0 oz/week  . Drug use: No  . Sexual activity: Not Asked   Other Topics Concern  . None   Social History Narrative  .  None    Outpatient Encounter Prescriptions as of 04/12/2017  Medication Sig  . acetaminophen (TYLENOL) 325 MG tablet Take 650 mg by mouth every 6 (six) hours as needed for pain.  Marland Kitchen aspirin 81 MG tablet Take 81 mg by mouth daily.  . Cholecalciferol (VITAMIN D3) 2000 UNITS TABS Take by mouth daily.  . clobetasol cream (TEMOVATE) 9.98 % Apply 1 application topically 2 (two) times daily.  . felodipine (PLENDIL) 10 MG 24 hr tablet Take 1 tablet (10 mg total) by mouth daily.  . hydrochlorothiazide (HYDRODIURIL) 25 MG tablet Take 1 tablet (25 mg total) by mouth daily.  Marland Kitchen lisinopril (PRINIVIL,ZESTRIL) 20 MG tablet TAKE TWO TABLETS BY MOUTH ONCE DAILY  . lisinopril (PRINIVIL,ZESTRIL) 20 MG tablet Take 2 tablets (40 mg total) by mouth daily.  Marland Kitchen lovastatin (MEVACOR) 40 MG tablet TAKE ONE TABLET BY MOUTH ONCE DAILY  . vitamin B-12 (CYANOCOBALAMIN) 1000 MCG tablet Take 1,000 mcg by mouth daily.  . [DISCONTINUED] clobetasol cream (TEMOVATE) 3.38 % Apply 1 application topically 2 (two) times daily.  . [DISCONTINUED] felodipine (PLENDIL) 10 MG 24 hr tablet TAKE ONE TABLET BY MOUTH ONCE DAILY  . [DISCONTINUED] lisinopril (PRINIVIL,ZESTRIL) 20 MG tablet Take 2 tablets (40 mg total) by mouth daily.  . [DISCONTINUED] felodipine (PLENDIL)  10 MG 24 hr tablet Take 1 tablet (10 mg total) by mouth daily.   No facility-administered encounter medications on file as of 04/12/2017.     Review of Systems  Constitutional: Negative for appetite change and unexpected weight change.  HENT: Negative for congestion and sinus pressure.   Respiratory: Negative for cough, chest tightness and shortness of breath.   Cardiovascular: Negative for chest pain and palpitations.       Lower extremity swelling better.    Gastrointestinal: Negative for abdominal pain, diarrhea, nausea and vomiting.  Genitourinary: Negative for difficulty urinating and dysuria.  Musculoskeletal: Negative for back pain and myalgias.  Skin: Negative for  color change and rash.  Neurological: Negative for dizziness, light-headedness and headaches.  Psychiatric/Behavioral: Negative for agitation and dysphoric mood.       Objective:     Blood pressure rechecked by me:  132/78  Physical Exam  Constitutional: He appears well-developed and well-nourished. No distress.  HENT:  Nose: Nose normal.  Mouth/Throat: Oropharynx is clear and moist.  Neck: Neck supple. No thyromegaly present.  Cardiovascular: Normal rate and regular rhythm.   Pulmonary/Chest: Effort normal and breath sounds normal. No respiratory distress.  Abdominal: Soft. Bowel sounds are normal. There is no tenderness.  Musculoskeletal: He exhibits no tenderness.  Lower extremity edema - improved.  Compression hose in place.    Lymphadenopathy:    He has no cervical adenopathy.  Skin: No rash noted. No erythema.  Psychiatric: He has a normal mood and affect. His behavior is normal.    BP 132/78   Pulse 72   Temp 98.3 F (36.8 C) (Oral)   Resp 12   Ht 6\' 2"  (1.88 m)   Wt (!) 306 lb 3.2 oz (138.9 kg)   SpO2 96%   BMI 39.31 kg/m  Wt Readings from Last 3 Encounters:  04/12/17 (!) 306 lb 3.2 oz (138.9 kg)  01/25/17 (!) 301 lb 6.4 oz (136.7 kg)  11/23/16 (!) 302 lb 6.4 oz (137.2 kg)     Lab Results  Component Value Date   WBC 7.1 07/06/2016   HGB 13.3 07/06/2016   HCT 40.3 07/06/2016   PLT 202.0 07/06/2016   GLUCOSE 114 (H) 11/21/2016   CHOL 138 11/21/2016   TRIG 128.0 11/21/2016   HDL 45.10 11/21/2016   LDLCALC 68 11/21/2016   ALT 28 11/21/2016   AST 28 11/21/2016   NA 137 11/21/2016   K 4.0 11/21/2016   CL 103 11/21/2016   CREATININE 0.89 11/21/2016   BUN 14 11/21/2016   CO2 30 11/21/2016   TSH 2.67 06/01/2016   PSA 1.21 06/01/2016   HGBA1C 5.5 06/12/2016       Assessment & Plan:   Problem List Items Addressed This Visit    Essential hypertension, benign    Blood pressure under good control.  Continue same medication regimen.  Follow pressures.   Follow metabolic panel.        Relevant Medications   lisinopril (PRINIVIL,ZESTRIL) 20 MG tablet   felodipine (PLENDIL) 10 MG 24 hr tablet   Other Relevant Orders   CBC with Differential/Platelet   TSH   Basic metabolic panel   Hypercholesterolemia    On lovastatin.  Low cholesterol diet and exercise.  Follow lipid panel and liver function tests.        Relevant Medications   lisinopril (PRINIVIL,ZESTRIL) 20 MG tablet   felodipine (PLENDIL) 10 MG 24 hr tablet   Other Relevant Orders   Hepatic function panel  Lipid panel   Peripheral vascular disease ([place])    Continue risk factor modification.        Relevant Medications   lisinopril (PRINIVIL,ZESTRIL) 20 MG tablet   felodipine (PLENDIL) 10 MG 24 hr tablet   Right foot drop    Has right foot drop.  Seeing neurology.  Taking B12 supplements daily.  Has ultrasound and NCS scheduled.  Also planned f/u with podiatry.         Other Visit Diagnoses    Need for pneumococcal vaccination    -  Primary   Relevant Orders   Pneumococcal conjugate vaccine 13-valent (Completed)       Einar Pheasant, MD

## 2017-04-12 NOTE — Progress Notes (Signed)
Pre-visit discussion using our clinic review tool. No additional management support is needed unless otherwise documented below in the visit note.  

## 2017-04-14 ENCOUNTER — Encounter: Payer: Self-pay | Admitting: Internal Medicine

## 2017-04-14 NOTE — Assessment & Plan Note (Signed)
Blood pressure under good control.  Continue same medication regimen.  Follow pressures.  Follow metabolic panel.   

## 2017-04-14 NOTE — Assessment & Plan Note (Signed)
On lovastatin.  Low cholesterol diet and exercise.  Follow lipid panel and liver function tests.   

## 2017-04-14 NOTE — Assessment & Plan Note (Signed)
Has right foot drop.  Seeing neurology.  Taking B12 supplements daily.  Has ultrasound and NCS scheduled.  Also planned f/u with podiatry.

## 2017-04-14 NOTE — Assessment & Plan Note (Signed)
Continue risk factor modification 

## 2017-04-18 DIAGNOSIS — E538 Deficiency of other specified B group vitamins: Secondary | ICD-10-CM | POA: Diagnosis not present

## 2017-04-22 DIAGNOSIS — M25571 Pain in right ankle and joints of right foot: Secondary | ICD-10-CM | POA: Diagnosis not present

## 2017-04-23 ENCOUNTER — Other Ambulatory Visit (INDEPENDENT_AMBULATORY_CARE_PROVIDER_SITE_OTHER): Payer: Self-pay | Admitting: Family Medicine

## 2017-04-23 DIAGNOSIS — I872 Venous insufficiency (chronic) (peripheral): Secondary | ICD-10-CM

## 2017-04-24 ENCOUNTER — Ambulatory Visit (INDEPENDENT_AMBULATORY_CARE_PROVIDER_SITE_OTHER): Payer: Medicare Other

## 2017-04-24 DIAGNOSIS — I872 Venous insufficiency (chronic) (peripheral): Secondary | ICD-10-CM

## 2017-04-26 ENCOUNTER — Other Ambulatory Visit (INDEPENDENT_AMBULATORY_CARE_PROVIDER_SITE_OTHER): Payer: Medicare Other

## 2017-04-26 DIAGNOSIS — E7800 Pure hypercholesterolemia, unspecified: Secondary | ICD-10-CM

## 2017-04-26 DIAGNOSIS — I10 Essential (primary) hypertension: Secondary | ICD-10-CM | POA: Diagnosis not present

## 2017-04-26 LAB — HEPATIC FUNCTION PANEL
ALT: 18 U/L (ref 0–53)
AST: 20 U/L (ref 0–37)
Albumin: 4.1 g/dL (ref 3.5–5.2)
Alkaline Phosphatase: 69 U/L (ref 39–117)
BILIRUBIN DIRECT: 0.1 mg/dL (ref 0.0–0.3)
BILIRUBIN TOTAL: 0.4 mg/dL (ref 0.2–1.2)
Total Protein: 7 g/dL (ref 6.0–8.3)

## 2017-04-26 LAB — BASIC METABOLIC PANEL
BUN: 13 mg/dL (ref 6–23)
CALCIUM: 9.5 mg/dL (ref 8.4–10.5)
CO2: 29 mEq/L (ref 19–32)
CREATININE: 0.9 mg/dL (ref 0.40–1.50)
Chloride: 101 mEq/L (ref 96–112)
GFR: 108.54 mL/min (ref >60.00)
Glucose, Bld: 116 mg/dL — ABNORMAL HIGH (ref 70–99)
Potassium: 3.9 mEq/L (ref 3.5–5.1)
Sodium: 137 mEq/L (ref 135–145)

## 2017-04-26 LAB — CBC WITH DIFFERENTIAL/PLATELET
BASOS ABS: 0 10*3/uL (ref 0.0–0.1)
Basophils Relative: 0.2 % (ref 0.0–3.0)
EOS ABS: 0.3 10*3/uL (ref 0.0–0.7)
Eosinophils Relative: 3.7 % (ref 0.0–5.0)
HCT: 39.3 % (ref 39.0–52.0)
HEMOGLOBIN: 13 g/dL (ref 13.0–17.0)
Lymphocytes Relative: 25.7 % (ref 12.0–46.0)
Lymphs Abs: 1.9 10*3/uL (ref 0.7–4.0)
MCHC: 33 g/dL (ref 30.0–36.0)
MCV: 84.7 fl (ref 78.0–100.0)
MONO ABS: 0.9 10*3/uL (ref 0.1–1.0)
Monocytes Relative: 13 % — ABNORMAL HIGH (ref 3.0–12.0)
Neutro Abs: 4.1 10*3/uL (ref 1.4–7.7)
Neutrophils Relative %: 57.4 % (ref 43.0–77.0)
Platelets: 185 10*3/uL (ref 150.0–400.0)
RBC: 4.65 Mil/uL (ref 4.22–5.81)
RDW: 14.4 % (ref 11.5–15.5)
WBC: 7.2 10*3/uL (ref 4.0–10.5)

## 2017-04-26 LAB — LIPID PANEL
CHOL/HDL RATIO: 3
Cholesterol: 134 mg/dL (ref 0–200)
HDL: 48.1 mg/dL (ref >39.00)
LDL Cholesterol: 63 mg/dL (ref 0–99)
NONHDL: 86.33
Triglycerides: 119 mg/dL (ref 0.0–149.0)
VLDL: 23.8 mg/dL (ref 0.0–40.0)

## 2017-04-26 LAB — TSH: TSH: 3.71 u[IU]/mL (ref 0.35–4.50)

## 2017-04-28 ENCOUNTER — Other Ambulatory Visit: Payer: Self-pay | Admitting: Internal Medicine

## 2017-04-28 DIAGNOSIS — R739 Hyperglycemia, unspecified: Secondary | ICD-10-CM

## 2017-04-28 NOTE — Progress Notes (Signed)
Order placed for f/u labs.  

## 2017-05-07 DIAGNOSIS — M25571 Pain in right ankle and joints of right foot: Secondary | ICD-10-CM | POA: Diagnosis not present

## 2017-05-15 DIAGNOSIS — M25571 Pain in right ankle and joints of right foot: Secondary | ICD-10-CM | POA: Diagnosis not present

## 2017-05-15 DIAGNOSIS — G5602 Carpal tunnel syndrome, left upper limb: Secondary | ICD-10-CM | POA: Diagnosis not present

## 2017-05-15 DIAGNOSIS — E538 Deficiency of other specified B group vitamins: Secondary | ICD-10-CM | POA: Diagnosis not present

## 2017-05-15 DIAGNOSIS — G608 Other hereditary and idiopathic neuropathies: Secondary | ICD-10-CM | POA: Diagnosis not present

## 2017-05-22 DIAGNOSIS — M25571 Pain in right ankle and joints of right foot: Secondary | ICD-10-CM | POA: Diagnosis not present

## 2017-05-24 DIAGNOSIS — M25571 Pain in right ankle and joints of right foot: Secondary | ICD-10-CM | POA: Diagnosis not present

## 2017-05-24 DIAGNOSIS — M25671 Stiffness of right ankle, not elsewhere classified: Secondary | ICD-10-CM | POA: Diagnosis not present

## 2017-05-24 DIAGNOSIS — R262 Difficulty in walking, not elsewhere classified: Secondary | ICD-10-CM | POA: Diagnosis not present

## 2017-05-24 DIAGNOSIS — M6281 Muscle weakness (generalized): Secondary | ICD-10-CM | POA: Diagnosis not present

## 2017-05-27 DIAGNOSIS — M25571 Pain in right ankle and joints of right foot: Secondary | ICD-10-CM | POA: Diagnosis not present

## 2017-05-27 DIAGNOSIS — M6281 Muscle weakness (generalized): Secondary | ICD-10-CM | POA: Diagnosis not present

## 2017-05-27 DIAGNOSIS — M25671 Stiffness of right ankle, not elsewhere classified: Secondary | ICD-10-CM | POA: Diagnosis not present

## 2017-05-27 DIAGNOSIS — R262 Difficulty in walking, not elsewhere classified: Secondary | ICD-10-CM | POA: Diagnosis not present

## 2017-05-28 ENCOUNTER — Other Ambulatory Visit (INDEPENDENT_AMBULATORY_CARE_PROVIDER_SITE_OTHER): Payer: Medicare Other

## 2017-05-28 DIAGNOSIS — R739 Hyperglycemia, unspecified: Secondary | ICD-10-CM | POA: Diagnosis not present

## 2017-05-28 LAB — GLUCOSE, FASTING: GLUCOSE, FASTING: 105 mg/dL — AB (ref 65–99)

## 2017-05-28 NOTE — Addendum Note (Signed)
Addended by: Arby Barrette on: 05/28/2017 08:39 AM   Modules accepted: Orders

## 2017-05-29 ENCOUNTER — Telehealth: Payer: Self-pay | Admitting: *Deleted

## 2017-05-29 DIAGNOSIS — M6281 Muscle weakness (generalized): Secondary | ICD-10-CM | POA: Diagnosis not present

## 2017-05-29 DIAGNOSIS — M25671 Stiffness of right ankle, not elsewhere classified: Secondary | ICD-10-CM | POA: Diagnosis not present

## 2017-05-29 DIAGNOSIS — M25571 Pain in right ankle and joints of right foot: Secondary | ICD-10-CM | POA: Diagnosis not present

## 2017-05-29 DIAGNOSIS — R262 Difficulty in walking, not elsewhere classified: Secondary | ICD-10-CM | POA: Diagnosis not present

## 2017-05-29 LAB — HEMOGLOBIN A1C
HEMOGLOBIN A1C: 5.7 % — AB (ref <5.7)
Mean Plasma Glucose: 117 mg/dL

## 2017-05-29 NOTE — Telephone Encounter (Signed)
Patient aware of results.

## 2017-05-29 NOTE — Telephone Encounter (Signed)
Pt has requested lab results Pt contact [phone]

## 2017-05-30 DIAGNOSIS — M25571 Pain in right ankle and joints of right foot: Secondary | ICD-10-CM | POA: Diagnosis not present

## 2017-05-30 DIAGNOSIS — M6281 Muscle weakness (generalized): Secondary | ICD-10-CM | POA: Diagnosis not present

## 2017-05-30 DIAGNOSIS — M25671 Stiffness of right ankle, not elsewhere classified: Secondary | ICD-10-CM | POA: Diagnosis not present

## 2017-05-30 DIAGNOSIS — R262 Difficulty in walking, not elsewhere classified: Secondary | ICD-10-CM | POA: Diagnosis not present

## 2017-06-03 DIAGNOSIS — M6281 Muscle weakness (generalized): Secondary | ICD-10-CM | POA: Diagnosis not present

## 2017-06-03 DIAGNOSIS — M25671 Stiffness of right ankle, not elsewhere classified: Secondary | ICD-10-CM | POA: Diagnosis not present

## 2017-06-03 DIAGNOSIS — M25571 Pain in right ankle and joints of right foot: Secondary | ICD-10-CM | POA: Diagnosis not present

## 2017-06-03 DIAGNOSIS — R262 Difficulty in walking, not elsewhere classified: Secondary | ICD-10-CM | POA: Diagnosis not present

## 2017-06-04 DIAGNOSIS — M25571 Pain in right ankle and joints of right foot: Secondary | ICD-10-CM | POA: Diagnosis not present

## 2017-06-04 DIAGNOSIS — R262 Difficulty in walking, not elsewhere classified: Secondary | ICD-10-CM | POA: Diagnosis not present

## 2017-06-04 DIAGNOSIS — M25671 Stiffness of right ankle, not elsewhere classified: Secondary | ICD-10-CM | POA: Diagnosis not present

## 2017-06-04 DIAGNOSIS — M6281 Muscle weakness (generalized): Secondary | ICD-10-CM | POA: Diagnosis not present

## 2017-06-06 DIAGNOSIS — M6281 Muscle weakness (generalized): Secondary | ICD-10-CM | POA: Diagnosis not present

## 2017-06-06 DIAGNOSIS — M25671 Stiffness of right ankle, not elsewhere classified: Secondary | ICD-10-CM | POA: Diagnosis not present

## 2017-06-06 DIAGNOSIS — M25571 Pain in right ankle and joints of right foot: Secondary | ICD-10-CM | POA: Diagnosis not present

## 2017-06-06 DIAGNOSIS — R262 Difficulty in walking, not elsewhere classified: Secondary | ICD-10-CM | POA: Diagnosis not present

## 2017-06-11 DIAGNOSIS — M6281 Muscle weakness (generalized): Secondary | ICD-10-CM | POA: Diagnosis not present

## 2017-06-11 DIAGNOSIS — M25571 Pain in right ankle and joints of right foot: Secondary | ICD-10-CM | POA: Diagnosis not present

## 2017-06-11 DIAGNOSIS — R262 Difficulty in walking, not elsewhere classified: Secondary | ICD-10-CM | POA: Diagnosis not present

## 2017-06-11 DIAGNOSIS — M25671 Stiffness of right ankle, not elsewhere classified: Secondary | ICD-10-CM | POA: Diagnosis not present

## 2017-06-13 DIAGNOSIS — M6281 Muscle weakness (generalized): Secondary | ICD-10-CM | POA: Diagnosis not present

## 2017-06-13 DIAGNOSIS — M25671 Stiffness of right ankle, not elsewhere classified: Secondary | ICD-10-CM | POA: Diagnosis not present

## 2017-06-13 DIAGNOSIS — R262 Difficulty in walking, not elsewhere classified: Secondary | ICD-10-CM | POA: Diagnosis not present

## 2017-06-13 DIAGNOSIS — M25571 Pain in right ankle and joints of right foot: Secondary | ICD-10-CM | POA: Diagnosis not present

## 2017-06-18 DIAGNOSIS — R262 Difficulty in walking, not elsewhere classified: Secondary | ICD-10-CM | POA: Diagnosis not present

## 2017-06-18 DIAGNOSIS — M25671 Stiffness of right ankle, not elsewhere classified: Secondary | ICD-10-CM | POA: Diagnosis not present

## 2017-06-18 DIAGNOSIS — M25571 Pain in right ankle and joints of right foot: Secondary | ICD-10-CM | POA: Diagnosis not present

## 2017-06-18 DIAGNOSIS — M6281 Muscle weakness (generalized): Secondary | ICD-10-CM | POA: Diagnosis not present

## 2017-06-20 DIAGNOSIS — M25671 Stiffness of right ankle, not elsewhere classified: Secondary | ICD-10-CM | POA: Diagnosis not present

## 2017-06-20 DIAGNOSIS — M6281 Muscle weakness (generalized): Secondary | ICD-10-CM | POA: Diagnosis not present

## 2017-06-20 DIAGNOSIS — R262 Difficulty in walking, not elsewhere classified: Secondary | ICD-10-CM | POA: Diagnosis not present

## 2017-06-20 DIAGNOSIS — M25571 Pain in right ankle and joints of right foot: Secondary | ICD-10-CM | POA: Diagnosis not present

## 2017-06-25 DIAGNOSIS — M25571 Pain in right ankle and joints of right foot: Secondary | ICD-10-CM | POA: Diagnosis not present

## 2017-06-25 DIAGNOSIS — R262 Difficulty in walking, not elsewhere classified: Secondary | ICD-10-CM | POA: Diagnosis not present

## 2017-06-25 DIAGNOSIS — M25671 Stiffness of right ankle, not elsewhere classified: Secondary | ICD-10-CM | POA: Diagnosis not present

## 2017-06-25 DIAGNOSIS — M6281 Muscle weakness (generalized): Secondary | ICD-10-CM | POA: Diagnosis not present

## 2017-06-27 DIAGNOSIS — R262 Difficulty in walking, not elsewhere classified: Secondary | ICD-10-CM | POA: Diagnosis not present

## 2017-06-27 DIAGNOSIS — M6281 Muscle weakness (generalized): Secondary | ICD-10-CM | POA: Diagnosis not present

## 2017-06-27 DIAGNOSIS — M25671 Stiffness of right ankle, not elsewhere classified: Secondary | ICD-10-CM | POA: Diagnosis not present

## 2017-06-27 DIAGNOSIS — M25571 Pain in right ankle and joints of right foot: Secondary | ICD-10-CM | POA: Diagnosis not present

## 2017-07-01 DIAGNOSIS — M6281 Muscle weakness (generalized): Secondary | ICD-10-CM | POA: Diagnosis not present

## 2017-07-01 DIAGNOSIS — M25571 Pain in right ankle and joints of right foot: Secondary | ICD-10-CM | POA: Diagnosis not present

## 2017-07-01 DIAGNOSIS — M25671 Stiffness of right ankle, not elsewhere classified: Secondary | ICD-10-CM | POA: Diagnosis not present

## 2017-07-01 DIAGNOSIS — R262 Difficulty in walking, not elsewhere classified: Secondary | ICD-10-CM | POA: Diagnosis not present

## 2017-07-03 DIAGNOSIS — M25671 Stiffness of right ankle, not elsewhere classified: Secondary | ICD-10-CM | POA: Diagnosis not present

## 2017-07-03 DIAGNOSIS — R262 Difficulty in walking, not elsewhere classified: Secondary | ICD-10-CM | POA: Diagnosis not present

## 2017-07-03 DIAGNOSIS — M6281 Muscle weakness (generalized): Secondary | ICD-10-CM | POA: Diagnosis not present

## 2017-07-03 DIAGNOSIS — M25571 Pain in right ankle and joints of right foot: Secondary | ICD-10-CM | POA: Diagnosis not present

## 2017-07-08 DIAGNOSIS — M25671 Stiffness of right ankle, not elsewhere classified: Secondary | ICD-10-CM | POA: Diagnosis not present

## 2017-07-08 DIAGNOSIS — R262 Difficulty in walking, not elsewhere classified: Secondary | ICD-10-CM | POA: Diagnosis not present

## 2017-07-08 DIAGNOSIS — M25571 Pain in right ankle and joints of right foot: Secondary | ICD-10-CM | POA: Diagnosis not present

## 2017-07-08 DIAGNOSIS — M6281 Muscle weakness (generalized): Secondary | ICD-10-CM | POA: Diagnosis not present

## 2017-07-11 DIAGNOSIS — R262 Difficulty in walking, not elsewhere classified: Secondary | ICD-10-CM | POA: Diagnosis not present

## 2017-07-11 DIAGNOSIS — M25571 Pain in right ankle and joints of right foot: Secondary | ICD-10-CM | POA: Diagnosis not present

## 2017-07-11 DIAGNOSIS — M6281 Muscle weakness (generalized): Secondary | ICD-10-CM | POA: Diagnosis not present

## 2017-07-11 DIAGNOSIS — M25671 Stiffness of right ankle, not elsewhere classified: Secondary | ICD-10-CM | POA: Diagnosis not present

## 2017-07-15 DIAGNOSIS — M25571 Pain in right ankle and joints of right foot: Secondary | ICD-10-CM | POA: Diagnosis not present

## 2017-07-15 DIAGNOSIS — M25671 Stiffness of right ankle, not elsewhere classified: Secondary | ICD-10-CM | POA: Diagnosis not present

## 2017-07-15 DIAGNOSIS — M6281 Muscle weakness (generalized): Secondary | ICD-10-CM | POA: Diagnosis not present

## 2017-07-15 DIAGNOSIS — R262 Difficulty in walking, not elsewhere classified: Secondary | ICD-10-CM | POA: Diagnosis not present

## 2017-07-17 DIAGNOSIS — M25571 Pain in right ankle and joints of right foot: Secondary | ICD-10-CM | POA: Diagnosis not present

## 2017-07-17 DIAGNOSIS — L299 Pruritus, unspecified: Secondary | ICD-10-CM | POA: Diagnosis not present

## 2017-07-17 DIAGNOSIS — M6281 Muscle weakness (generalized): Secondary | ICD-10-CM | POA: Diagnosis not present

## 2017-07-17 DIAGNOSIS — D485 Neoplasm of uncertain behavior of skin: Secondary | ICD-10-CM | POA: Diagnosis not present

## 2017-07-17 DIAGNOSIS — R262 Difficulty in walking, not elsewhere classified: Secondary | ICD-10-CM | POA: Diagnosis not present

## 2017-07-17 DIAGNOSIS — B359 Dermatophytosis, unspecified: Secondary | ICD-10-CM | POA: Diagnosis not present

## 2017-07-17 DIAGNOSIS — M25671 Stiffness of right ankle, not elsewhere classified: Secondary | ICD-10-CM | POA: Diagnosis not present

## 2017-07-17 DIAGNOSIS — B354 Tinea corporis: Secondary | ICD-10-CM | POA: Diagnosis not present

## 2017-07-23 DIAGNOSIS — M25671 Stiffness of right ankle, not elsewhere classified: Secondary | ICD-10-CM | POA: Diagnosis not present

## 2017-07-23 DIAGNOSIS — M6281 Muscle weakness (generalized): Secondary | ICD-10-CM | POA: Diagnosis not present

## 2017-07-23 DIAGNOSIS — R262 Difficulty in walking, not elsewhere classified: Secondary | ICD-10-CM | POA: Diagnosis not present

## 2017-07-23 DIAGNOSIS — M25571 Pain in right ankle and joints of right foot: Secondary | ICD-10-CM | POA: Diagnosis not present

## 2017-07-25 DIAGNOSIS — Z6837 Body mass index (BMI) 37.0-37.9, adult: Secondary | ICD-10-CM | POA: Diagnosis not present

## 2017-07-25 DIAGNOSIS — R269 Unspecified abnormalities of gait and mobility: Secondary | ICD-10-CM | POA: Diagnosis not present

## 2017-07-25 DIAGNOSIS — E559 Vitamin D deficiency, unspecified: Secondary | ICD-10-CM | POA: Diagnosis not present

## 2017-07-25 DIAGNOSIS — G608 Other hereditary and idiopathic neuropathies: Secondary | ICD-10-CM | POA: Diagnosis not present

## 2017-07-25 DIAGNOSIS — G5602 Carpal tunnel syndrome, left upper limb: Secondary | ICD-10-CM | POA: Diagnosis not present

## 2017-07-26 DIAGNOSIS — R262 Difficulty in walking, not elsewhere classified: Secondary | ICD-10-CM | POA: Diagnosis not present

## 2017-07-26 DIAGNOSIS — M25671 Stiffness of right ankle, not elsewhere classified: Secondary | ICD-10-CM | POA: Diagnosis not present

## 2017-07-26 DIAGNOSIS — M6281 Muscle weakness (generalized): Secondary | ICD-10-CM | POA: Diagnosis not present

## 2017-07-26 DIAGNOSIS — M25571 Pain in right ankle and joints of right foot: Secondary | ICD-10-CM | POA: Diagnosis not present

## 2017-07-29 DIAGNOSIS — M25671 Stiffness of right ankle, not elsewhere classified: Secondary | ICD-10-CM | POA: Diagnosis not present

## 2017-07-29 DIAGNOSIS — R262 Difficulty in walking, not elsewhere classified: Secondary | ICD-10-CM | POA: Diagnosis not present

## 2017-07-29 DIAGNOSIS — M6281 Muscle weakness (generalized): Secondary | ICD-10-CM | POA: Diagnosis not present

## 2017-07-29 DIAGNOSIS — M25571 Pain in right ankle and joints of right foot: Secondary | ICD-10-CM | POA: Diagnosis not present

## 2017-07-31 DIAGNOSIS — M25571 Pain in right ankle and joints of right foot: Secondary | ICD-10-CM | POA: Diagnosis not present

## 2017-07-31 DIAGNOSIS — M6281 Muscle weakness (generalized): Secondary | ICD-10-CM | POA: Diagnosis not present

## 2017-07-31 DIAGNOSIS — R262 Difficulty in walking, not elsewhere classified: Secondary | ICD-10-CM | POA: Diagnosis not present

## 2017-07-31 DIAGNOSIS — M25671 Stiffness of right ankle, not elsewhere classified: Secondary | ICD-10-CM | POA: Diagnosis not present

## 2017-08-06 DIAGNOSIS — R262 Difficulty in walking, not elsewhere classified: Secondary | ICD-10-CM | POA: Diagnosis not present

## 2017-08-06 DIAGNOSIS — M25571 Pain in right ankle and joints of right foot: Secondary | ICD-10-CM | POA: Diagnosis not present

## 2017-08-06 DIAGNOSIS — M6281 Muscle weakness (generalized): Secondary | ICD-10-CM | POA: Diagnosis not present

## 2017-08-06 DIAGNOSIS — M25671 Stiffness of right ankle, not elsewhere classified: Secondary | ICD-10-CM | POA: Diagnosis not present

## 2017-08-08 DIAGNOSIS — M25671 Stiffness of right ankle, not elsewhere classified: Secondary | ICD-10-CM | POA: Diagnosis not present

## 2017-08-08 DIAGNOSIS — B354 Tinea corporis: Secondary | ICD-10-CM | POA: Diagnosis not present

## 2017-08-08 DIAGNOSIS — M6281 Muscle weakness (generalized): Secondary | ICD-10-CM | POA: Diagnosis not present

## 2017-08-08 DIAGNOSIS — R262 Difficulty in walking, not elsewhere classified: Secondary | ICD-10-CM | POA: Diagnosis not present

## 2017-08-08 DIAGNOSIS — M25571 Pain in right ankle and joints of right foot: Secondary | ICD-10-CM | POA: Diagnosis not present

## 2017-08-22 ENCOUNTER — Ambulatory Visit (INDEPENDENT_AMBULATORY_CARE_PROVIDER_SITE_OTHER): Payer: Medicare Other | Admitting: Internal Medicine

## 2017-08-22 ENCOUNTER — Encounter: Payer: Self-pay | Admitting: Internal Medicine

## 2017-08-22 VITALS — BP 138/76 | HR 92 | Temp 98.2°F | Resp 18 | Ht 74.02 in | Wt 296.6 lb

## 2017-08-22 DIAGNOSIS — I739 Peripheral vascular disease, unspecified: Secondary | ICD-10-CM

## 2017-08-22 DIAGNOSIS — Z125 Encounter for screening for malignant neoplasm of prostate: Secondary | ICD-10-CM

## 2017-08-22 DIAGNOSIS — N529 Male erectile dysfunction, unspecified: Secondary | ICD-10-CM

## 2017-08-22 DIAGNOSIS — I10 Essential (primary) hypertension: Secondary | ICD-10-CM | POA: Diagnosis not present

## 2017-08-22 DIAGNOSIS — E7800 Pure hypercholesterolemia, unspecified: Secondary | ICD-10-CM

## 2017-08-22 DIAGNOSIS — R739 Hyperglycemia, unspecified: Secondary | ICD-10-CM

## 2017-08-22 DIAGNOSIS — Z23 Encounter for immunization: Secondary | ICD-10-CM | POA: Diagnosis not present

## 2017-08-22 DIAGNOSIS — M21371 Foot drop, right foot: Secondary | ICD-10-CM

## 2017-08-22 MED ORDER — FELODIPINE ER 10 MG PO TB24: 10.0000 mg | ORAL_TABLET | Freq: Every day | ORAL | 1 refills | Status: DC

## 2017-08-22 MED ORDER — TADALAFIL 5 MG PO TABS: ORAL_TABLET | ORAL | 0 refills | Status: DC

## 2017-08-22 MED ORDER — HYDROCHLOROTHIAZIDE 25 MG PO TABS: 25.0000 mg | ORAL_TABLET | Freq: Every day | ORAL | 3 refills | Status: DC

## 2017-08-22 MED ORDER — LOVASTATIN 40 MG PO TABS: 40.0000 mg | ORAL_TABLET | Freq: Every day | ORAL | 5 refills | Status: DC

## 2017-08-22 NOTE — Progress Notes (Signed)
Patient ID: Charles Nichols, male   DOB: [birth year], 66 y.o.   MRN: [id number]   Subjective:    Patient ID: Charles Nichols, male    DOB: 1951/03/08, 66 y.o.   MRN: [id number]  HPI  Patient here for a scheduled follow up.  Seeing neurology for gait abnormality and right foot drop.  Felt to be c/w restriction of ROM at ankle from arthritic changes.  NCS revealed generalized severe sensorimotor distal polyneuropathy and left mild CTS.  He is seeing podiatry for his ankle issues.  Also taking B12.  Found to be B12 deficient.  Overall he feels he is doing better.  He is exercising.  Riding his bike.  No chest pain.  No sob.  No acid reflux.  No abdominal pain. Bowels moving.  Skin better.  Saw dermatology. Planning f/u 09/05/17   Past Medical History:  Diagnosis Date  . Arthritis   . History of chicken pox   . Hypertension   . Obesity   . Peripheral vascular disease ([place])   . Peripheral vascular disease [facility])    Past Surgical History:  Procedure Laterality Date  . CIRCUMCISION    . COLONOSCOPY WITH PROPOFOL N/A 04/08/2015   Performed by Lollie Sails, MD at [place]  . ESOPHAGOGASTRODUODENOSCOPY (EGD) N/A 04/08/2015   Performed by Lollie Sails, MD at [place]  . FEMORAL-POPLITEAL BYPASS GRAFT Bilateral 1997, 2000  . leg stent     pvd   Family History  Problem Relation Age of Onset  . Breast cancer Sister   . Hypertension Sister   . Hypertension Brother   . Breast cancer Daughter    Social History   Socioeconomic History  . Marital status: Legally Separated    Spouse name: None  . Number of children: None  . Years of education: None  . Highest education level: None  Social Needs  . Financial resource strain: None  . Food insecurity - worry: None  . Food insecurity - inability: None  . Transportation needs - medical: None  . Transportation needs - non-medical: None  Occupational History  . None  Tobacco Use  . Smoking status: Former Research scientist (life sciences)  . Smokeless  tobacco: Never Used  Substance and Sexual Activity  . Alcohol use: Yes    Alcohol/week: 0.0 oz  . Drug use: No  . Sexual activity: None  Other Topics Concern  . None  Social History Narrative  . None    Outpatient Encounter Medications as of 08/22/2017  Medication Sig  . acetaminophen (TYLENOL) 325 MG tablet Take 650 mg by mouth every 6 (six) hours as needed for pain.  Marland Kitchen aspirin 81 MG tablet Take 81 mg by mouth daily.  . Cholecalciferol (VITAMIN D3) 2000 UNITS TABS Take by mouth daily.  . clobetasol cream (TEMOVATE) 3.47 % Apply 1 application topically 2 (two) times daily.  . felodipine (PLENDIL) 10 MG 24 hr tablet Take 1 tablet (10 mg total) daily by mouth.  . hydrochlorothiazide (HYDRODIURIL) 25 MG tablet Take 1 tablet (25 mg total) daily by mouth.  Marland Kitchen lisinopril (PRINIVIL,ZESTRIL) 20 MG tablet Take 2 tablets (40 mg total) by mouth daily.  Marland Kitchen lovastatin (MEVACOR) 40 MG tablet Take 1 tablet (40 mg total) daily by mouth.  . terbinafine (LAMISIL) 250 MG tablet Take 250 mg daily by mouth.  . vitamin B-12 (CYANOCOBALAMIN) 1000 MCG tablet Take 1,000 mcg by mouth daily.  . [DISCONTINUED] felodipine (PLENDIL) 10 MG 24 hr tablet Take 1 tablet (10 mg  total) by mouth daily.  . [DISCONTINUED] hydrochlorothiazide (HYDRODIURIL) 25 MG tablet Take 1 tablet (25 mg total) by mouth daily.  . [DISCONTINUED] lisinopril (PRINIVIL,ZESTRIL) 20 MG tablet TAKE TWO TABLETS BY MOUTH ONCE DAILY  . [DISCONTINUED] lovastatin (MEVACOR) 40 MG tablet TAKE ONE TABLET BY MOUTH ONCE DAILY  . tadalafil (CIALIS) 5 MG tablet Take one tablet q 72 hours prn - as directed.   No facility-administered encounter medications on file as of 08/22/2017.     Review of Systems  Constitutional: Negative for appetite change and unexpected weight change.  HENT: Negative for congestion and sinus pressure.   Respiratory: Negative for cough, chest tightness and shortness of breath.   Cardiovascular: Negative for chest pain and  palpitations.       No increased lower extremity swelling.   Gastrointestinal: Negative for abdominal pain, diarrhea, nausea and vomiting.  Genitourinary: Negative for difficulty urinating and dysuria.       Reports erectile dysfunction.  Problems sustaining an erection.   Musculoskeletal: Negative for back pain and myalgias.  Skin: Negative for color change and rash.  Neurological: Negative for dizziness, light-headedness and headaches.  Psychiatric/Behavioral: Negative for agitation and dysphoric mood.       Objective:    Physical Exam  Constitutional: He appears well-developed and well-nourished. No distress.  HENT:  Nose: Nose normal.  Mouth/Throat: Oropharynx is clear and moist.  Neck: Neck supple. No thyromegaly present.  Cardiovascular: Normal rate and regular rhythm.  Pulmonary/Chest: Effort normal and breath sounds normal. No respiratory distress.  Abdominal: Soft. Bowel sounds are normal. There is no tenderness.  Musculoskeletal: He exhibits no edema or tenderness.  Lymphadenopathy:    He has no cervical adenopathy.  Skin: No rash noted. No erythema.  Psychiatric: He has a normal mood and affect. His behavior is normal.    BP 138/76 (BP Location: Left Arm, Patient Position: Sitting, Cuff Size: Large)   Pulse 92   Temp 98.2 F (36.8 C) (Oral)   Resp 18   Ht 6' 2.02" (1.88 m)   Wt 296 lb 9.6 oz (134.5 kg)   SpO2 97%   BMI 38.06 kg/m  Wt Readings from Last 3 Encounters:  08/22/17 296 lb 9.6 oz (134.5 kg)  04/12/17 (!) 306 lb 3.2 oz (138.9 kg)  01/25/17 (!) 301 lb 6.4 oz (136.7 kg)     Lab Results  Component Value Date   WBC 7.2 04/26/2017   HGB 13.0 04/26/2017   HCT 39.3 04/26/2017   PLT 185.0 04/26/2017   GLUCOSE 116 (H) 04/26/2017   CHOL 134 04/26/2017   TRIG 119.0 04/26/2017   HDL 48.10 04/26/2017   LDLCALC 63 04/26/2017   ALT 18 04/26/2017   AST 20 04/26/2017   NA 137 04/26/2017   K 3.9 04/26/2017   CL 101 04/26/2017   CREATININE 0.90  04/26/2017   BUN 13 04/26/2017   CO2 29 04/26/2017   TSH 3.71 04/26/2017   PSA 1.21 06/01/2016   HGBA1C 5.7 (H) 05/28/2017       Assessment & Plan:   Problem List Items Addressed This Visit    Erectile dysfunction    Discussed with him today.  Discussed treatment.  He prefers to try cialis.  EKG - SR with no acute ischemic changes.  He is currently without symptoms, no chest pain, no sob.        Essential hypertension, benign - Primary   Relevant Medications   felodipine (PLENDIL) 10 MG 24 hr tablet   hydrochlorothiazide (HYDRODIURIL)  25 MG tablet   lovastatin (MEVACOR) 40 MG tablet   tadalafil (CIALIS) 5 MG tablet   Other Relevant Orders   EKG 12-Lead (Completed)   Basic metabolic panel   Hypercholesterolemia    On lovastatin.  Low cholesterol diet and exercise.  Follow lipid panel and liver function tests.        Relevant Medications   felodipine (PLENDIL) 10 MG 24 hr tablet   hydrochlorothiazide (HYDRODIURIL) 25 MG tablet   lovastatin (MEVACOR) 40 MG tablet   tadalafil (CIALIS) 5 MG tablet   Other Relevant Orders   Hepatic function panel   Lipid panel   Peripheral vascular disease ([place])    Continue risk factor modification.  Currently without symptoms.        Relevant Medications   felodipine (PLENDIL) 10 MG 24 hr tablet   hydrochlorothiazide (HYDRODIURIL) 25 MG tablet   lovastatin (MEVACOR) 40 MG tablet   tadalafil (CIALIS) 5 MG tablet   Right foot drop    Seeing neurology and podiatry.  Felt to be c/w restriction at ankel from arthritic changes.  Does appear to be doing better.         Other Visit Diagnoses    Encounter for immunization       Relevant Orders   Flu vaccine HIGH DOSE PF (Completed)   Prostate cancer screening       Relevant Orders   PSA, Medicare   Hyperglycemia       Relevant Orders   Hemoglobin A1c       Einar Pheasant, MD

## 2017-08-25 ENCOUNTER — Encounter: Payer: Self-pay | Admitting: Internal Medicine

## 2017-08-25 DIAGNOSIS — N529 Male erectile dysfunction, unspecified: Secondary | ICD-10-CM | POA: Insufficient documentation

## 2017-08-25 NOTE — Assessment & Plan Note (Signed)
Continue risk factor modification.  Currently without symptoms.

## 2017-08-25 NOTE — Assessment & Plan Note (Signed)
Discussed with him today.  Discussed treatment.  He prefers to try cialis.  EKG - SR with no acute ischemic changes.  He is currently without symptoms, no chest pain, no sob.

## 2017-08-25 NOTE — Assessment & Plan Note (Signed)
On lovastatin.  Low cholesterol diet and exercise.  Follow lipid panel and liver function tests.   

## 2017-08-25 NOTE — Assessment & Plan Note (Signed)
Seeing neurology and podiatry.  Felt to be c/w restriction at ankel from arthritic changes.  Does appear to be doing better.

## 2017-08-26 ENCOUNTER — Telehealth: Payer: Self-pay | Admitting: Internal Medicine

## 2017-08-26 NOTE — Telephone Encounter (Signed)
Copied from [place] [id number]. Topic: Quick Communication - See Telephone Encounter >> Aug 26, 2017  1:17 PM Conception Chancy, NT wrote: CRM for notification. See Telephone encounter for:  08/26/17.  Pt moved and changed his pharmacy.. The Nialis needs to be sent to the [facility] [facility] that Is now onfile, it was sent to his old pharmacy in [place]. Pt would like a call back when RX is sent over.

## 2017-08-26 NOTE — Telephone Encounter (Signed)
Called  The  Patient   In  Regards  To  The   Cialis   Concern . The  [facility]  In  [place]    Has  The  Prescription   But  It  Requires  A  Pre- authorization . The  Pharmacist   Faxed  The  Forms  To   The office. The  Patient  Was  Notified.

## 2017-08-27 NOTE — Telephone Encounter (Signed)
FYI

## 2017-08-27 NOTE — Telephone Encounter (Signed)
Did you get this form for the prior authorization?

## 2017-08-27 NOTE — Telephone Encounter (Signed)
Yes was submitted this am key [id number]

## 2017-08-28 NOTE — Telephone Encounter (Signed)
Noted. Please notify pt.

## 2017-08-28 NOTE — Telephone Encounter (Signed)
Insurance denied ppw put in records folder for your review.

## 2017-08-28 NOTE — Telephone Encounter (Signed)
Patient informed he will pay cash for it.

## 2017-09-03 ENCOUNTER — Other Ambulatory Visit (INDEPENDENT_AMBULATORY_CARE_PROVIDER_SITE_OTHER): Payer: Medicare Other

## 2017-09-03 DIAGNOSIS — E7800 Pure hypercholesterolemia, unspecified: Secondary | ICD-10-CM | POA: Diagnosis not present

## 2017-09-03 DIAGNOSIS — I10 Essential (primary) hypertension: Secondary | ICD-10-CM | POA: Diagnosis not present

## 2017-09-03 DIAGNOSIS — R739 Hyperglycemia, unspecified: Secondary | ICD-10-CM | POA: Diagnosis not present

## 2017-09-03 DIAGNOSIS — Z125 Encounter for screening for malignant neoplasm of prostate: Secondary | ICD-10-CM | POA: Diagnosis not present

## 2017-09-03 LAB — HEPATIC FUNCTION PANEL
ALBUMIN: 4 g/dL (ref 3.5–5.2)
ALK PHOS: 71 U/L (ref 39–117)
ALT: 13 U/L (ref 0–53)
AST: 15 U/L (ref 0–37)
BILIRUBIN DIRECT: 0.1 mg/dL (ref 0.0–0.3)
TOTAL PROTEIN: 7 g/dL (ref 6.0–8.3)
Total Bilirubin: 0.3 mg/dL (ref 0.2–1.2)

## 2017-09-03 LAB — LIPID PANEL
CHOLESTEROL: 161 mg/dL (ref 0–200)
HDL: 31.7 mg/dL — ABNORMAL LOW (ref >39.00)
LDL CALC: 95 mg/dL (ref 0–99)
NonHDL: 129.45
TRIGLYCERIDES: 173 mg/dL — AB (ref 0.0–149.0)
Total CHOL/HDL Ratio: 5
VLDL: 34.6 mg/dL (ref 0.0–40.0)

## 2017-09-03 LAB — BASIC METABOLIC PANEL
BUN: 16 mg/dL (ref 6–23)
CALCIUM: 9.7 mg/dL (ref 8.4–10.5)
CO2: 28 mEq/L (ref 19–32)
CREATININE: 0.93 mg/dL (ref 0.40–1.50)
Chloride: 101 mEq/L (ref 96–112)
GFR: 104.39 mL/min (ref >60.00)
Glucose, Bld: 107 mg/dL — ABNORMAL HIGH (ref 70–99)
Potassium: 4 mEq/L (ref 3.5–5.1)
Sodium: 138 mEq/L (ref 135–145)

## 2017-09-03 LAB — HEMOGLOBIN A1C: Hgb A1c MFr Bld: 5.9 % (ref 4.6–6.5)

## 2017-09-03 LAB — PSA, MEDICARE: PSA: 1.64 ng/ml (ref 0.10–4.00)

## 2017-09-05 DIAGNOSIS — B354 Tinea corporis: Secondary | ICD-10-CM | POA: Diagnosis not present

## 2017-11-25 ENCOUNTER — Encounter: Payer: Self-pay | Admitting: Internal Medicine

## 2017-11-25 ENCOUNTER — Ambulatory Visit (INDEPENDENT_AMBULATORY_CARE_PROVIDER_SITE_OTHER): Payer: Medicare Other | Admitting: Internal Medicine

## 2017-11-25 VITALS — BP 128/84 | HR 68 | Temp 98.2°F | Resp 18 | Wt 300.2 lb

## 2017-11-25 DIAGNOSIS — E7800 Pure hypercholesterolemia, unspecified: Secondary | ICD-10-CM | POA: Diagnosis not present

## 2017-11-25 DIAGNOSIS — I10 Essential (primary) hypertension: Secondary | ICD-10-CM | POA: Diagnosis not present

## 2017-11-25 DIAGNOSIS — D649 Anemia, unspecified: Secondary | ICD-10-CM

## 2017-11-25 DIAGNOSIS — I739 Peripheral vascular disease, unspecified: Secondary | ICD-10-CM | POA: Diagnosis not present

## 2017-11-25 DIAGNOSIS — N529 Male erectile dysfunction, unspecified: Secondary | ICD-10-CM | POA: Diagnosis not present

## 2017-11-25 DIAGNOSIS — Z0000 Encounter for general adult medical examination without abnormal findings: Secondary | ICD-10-CM | POA: Diagnosis not present

## 2017-11-25 DIAGNOSIS — M21371 Foot drop, right foot: Secondary | ICD-10-CM

## 2017-11-25 DIAGNOSIS — R739 Hyperglycemia, unspecified: Secondary | ICD-10-CM | POA: Diagnosis not present

## 2017-11-25 MED ORDER — TADALAFIL 10 MG PO TABS: ORAL_TABLET | ORAL | 0 refills | Status: DC

## 2017-11-25 NOTE — Progress Notes (Signed)
Patient ID: Charles Nichols, male   DOB: Sep 12, 1951, 67 y.o.   MRN: [id number]   Subjective:    Patient ID: Charles Nichols, male    DOB: [birth year], 67 y.o.   MRN: [id number]  HPI  Patient with past history of hypercholesterolemia, peripheral vascular disease and hypertension.  He comes in today to follow up on these issues as well as for a complete physical exam.  He reports he is doing relatively well. Feel good.  Staying active.  Discussed diet and exercise.  He is trying to watch his diet.  No chest pain.  No sob. No acid reflux.  No abdominal pain.  Bowels moving.  Balance is better.     Past Medical History:  Diagnosis Date  . Arthritis   . History of chicken pox   . Hypertension   . Obesity   . Peripheral vascular disease ([place])   . Peripheral vascular disease [facility])    Past Surgical History:  Procedure Laterality Date  . CIRCUMCISION    . COLONOSCOPY WITH PROPOFOL N/A 04/08/2015   Procedure: COLONOSCOPY WITH PROPOFOL;  Surgeon: Lollie Sails, MD;  Location: [facility] ENDOSCOPY;  Service: Endoscopy;  Laterality: N/A;  . ESOPHAGOGASTRODUODENOSCOPY N/A 04/08/2015   Procedure: ESOPHAGOGASTRODUODENOSCOPY (EGD);  Surgeon: Lollie Sails, MD;  Location: [facility] ENDOSCOPY;  Service: Endoscopy;  Laterality: N/A;  . FEMORAL-POPLITEAL BYPASS GRAFT Bilateral 1997, 2000  . leg stent     pvd   Family History  Problem Relation Age of Onset  . Breast cancer Sister   . Hypertension Sister   . Hypertension Brother   . Breast cancer Daughter    Social History   Socioeconomic History  . Marital status: Legally Separated    Spouse name: None  . Number of children: None  . Years of education: None  . Highest education level: None  Social Needs  . Financial resource strain: None  . Food insecurity - worry: None  . Food insecurity - inability: None  . Transportation needs - medical: None  . Transportation needs - non-medical: None  Occupational History  . None  Tobacco Use  . Smoking status:  Former Research scientist (life sciences)  . Smokeless tobacco: Never Used  Substance and Sexual Activity  . Alcohol use: Yes    Alcohol/week: 0.0 oz  . Drug use: No  . Sexual activity: None  Other Topics Concern  . None  Social History Narrative  . None    Outpatient Encounter Medications as of 11/25/2017  Medication Sig  . acetaminophen (TYLENOL) 325 MG tablet Take 650 mg by mouth every 6 (six) hours as needed for pain.  Marland Kitchen aspirin 81 MG tablet Take 81 mg by mouth daily.  . Cholecalciferol (VITAMIN D3) 2000 UNITS TABS Take by mouth daily.  . felodipine (PLENDIL) 10 MG 24 hr tablet Take 1 tablet (10 mg total) daily by mouth.  . hydrochlorothiazide (HYDRODIURIL) 25 MG tablet Take 1 tablet (25 mg total) daily by mouth.  Marland Kitchen lisinopril (PRINIVIL,ZESTRIL) 20 MG tablet Take 2 tablets (40 mg total) by mouth daily.  Marland Kitchen lovastatin (MEVACOR) 40 MG tablet Take 1 tablet (40 mg total) daily by mouth.  . vitamin B-12 (CYANOCOBALAMIN) 1000 MCG tablet Take 1,000 mcg by mouth daily.  . clobetasol cream (TEMOVATE) 0.86 % Apply 1 application topically 2 (two) times daily. (Patient not taking: Reported on 11/25/2017)  . tadalafil (CIALIS) 10 MG tablet One tablet q 72 hours prn  . terbinafine (LAMISIL) 250 MG tablet Take 250 mg daily by mouth.  . [  DISCONTINUED] tadalafil (CIALIS) 5 MG tablet Take one tablet q 72 hours prn - as directed. (Patient not taking: Reported on 11/25/2017)   No facility-administered encounter medications on file as of 11/25/2017.     Review of Systems  Constitutional: Negative for appetite change and unexpected weight change.  HENT: Negative for congestion and sinus pressure.   Eyes: Negative for pain and visual disturbance.  Respiratory: Negative for cough, chest tightness and shortness of breath.   Cardiovascular: Negative for chest pain, palpitations and leg swelling.  Gastrointestinal: Negative for abdominal pain, diarrhea, nausea and vomiting.  Genitourinary: Negative for difficulty urinating and  dysuria.  Musculoskeletal: Negative for joint swelling and myalgias.  Skin: Negative for color change and rash.  Neurological: Negative for dizziness, light-headedness and headaches.  Hematological: Negative for adenopathy. Does not bruise/bleed easily.  Psychiatric/Behavioral: Negative for agitation and dysphoric mood.       Objective:     Blood pressure rechecked by me:  128/84  Physical Exam  Constitutional: He is oriented to person, place, and time. He appears well-developed and well-nourished. No distress.  HENT:  Head: Normocephalic and atraumatic.  Nose: Nose normal.  Mouth/Throat: Oropharynx is clear and moist. No oropharyngeal exudate.  Eyes: Conjunctivae are normal. Right eye exhibits no discharge. Left eye exhibits no discharge.  Neck: Neck supple. No thyromegaly present.  Cardiovascular: Normal rate and regular rhythm.  Pulmonary/Chest: Breath sounds normal. No respiratory distress. He has no wheezes.  Abdominal: Soft. Bowel sounds are normal. There is no tenderness.  Genitourinary:  Genitourinary Comments: Not performed.   Musculoskeletal: He exhibits no edema or tenderness.  Lymphadenopathy:    He has no cervical adenopathy.  Neurological: He is alert and oriented to person, place, and time.  Skin: Skin is warm and dry. No rash noted. No erythema.  Psychiatric: He has a normal mood and affect. His behavior is normal.    BP 128/84   Pulse 68   Temp 98.2 F (36.8 C) (Oral)   Resp 18   Wt (!) 300 lb 3.2 oz (136.2 kg)   SpO2 97%   BMI 38.53 kg/m  Wt Readings from Last 3 Encounters:  11/25/17 (!) 300 lb 3.2 oz (136.2 kg)  08/22/17 296 lb 9.6 oz (134.5 kg)  04/12/17 (!) 306 lb 3.2 oz (138.9 kg)     Lab Results  Component Value Date   WBC 7.2 04/26/2017   HGB 13.0 04/26/2017   HCT 39.3 04/26/2017   PLT 185.0 04/26/2017   GLUCOSE 107 (H) 09/03/2017   CHOL 161 09/03/2017   TRIG 173.0 (H) 09/03/2017   HDL 31.70 (L) 09/03/2017   LDLCALC 95 09/03/2017    ALT 13 09/03/2017   AST 15 09/03/2017   NA 138 09/03/2017   K 4.0 09/03/2017   CL 101 09/03/2017   CREATININE 0.93 09/03/2017   BUN 16 09/03/2017   CO2 28 09/03/2017   TSH 3.71 04/26/2017   PSA 1.64 09/03/2017   HGBA1C 5.9 09/03/2017       Assessment & Plan:   Problem List Items Addressed This Visit    Anemia    Follow cbc.       Erectile dysfunction    Discussed with him today.  Tried cialis 5mg .  Tolerated, but feels needs higher dose.  Increased to 10mg .  Follow.        Essential hypertension, benign    Blood pressure on recheck improved.  Same medication regimen.  Follow pressures.  Follow metabolic panel.  Relevant Medications   tadalafil (CIALIS) 10 MG tablet   Other Relevant Orders   TSH   CBC with Differential/Platelet   Basic metabolic panel   Health care maintenance    Physical today 11/25/17.  Colonoscopy 04/08/15 - diverticulosis and <38mm polyp.  Recommended f/u in 5 years.  PSA 08/2017 - 1.64.        Hypercholesterolemia    On lovastatin.  Low cholesterol diet and exercise.  Follow lipid panel and liver function tests.        Relevant Medications   tadalafil (CIALIS) 10 MG tablet   Other Relevant Orders   Hepatic function panel   Lipid panel   Peripheral vascular disease ([place])    Continue risk factor modification.  On lovastatin.  No leg pain with ambulation.       Relevant Medications   tadalafil (CIALIS) 10 MG tablet   Right foot drop    Evaluated by neurology and podiatry.  Improved.  Follow.        Other Visit Diagnoses    Hyperglycemia    -  Primary   Relevant Orders   Hemoglobin A1c       Einar Pheasant, MD

## 2017-11-28 ENCOUNTER — Encounter: Payer: Self-pay | Admitting: Internal Medicine

## 2017-11-28 NOTE — Assessment & Plan Note (Signed)
Blood pressure on recheck improved.  Same medication regimen.  Follow pressures.  Follow metabolic panel.   

## 2017-11-28 NOTE — Assessment & Plan Note (Signed)
Evaluated by neurology and podiatry.  Improved.  Follow.

## 2017-11-28 NOTE — Assessment & Plan Note (Addendum)
Continue risk factor modification.  On lovastatin.  No leg pain with ambulation.

## 2017-11-28 NOTE — Assessment & Plan Note (Signed)
Physical today 11/25/17.  Colonoscopy 04/08/15 - diverticulosis and <6mm polyp.  Recommended f/u in 5 years.  PSA 08/2017 - 1.64.

## 2017-11-28 NOTE — Assessment & Plan Note (Signed)
On lovastatin.  Low cholesterol diet and exercise.  Follow lipid panel and liver function tests.   

## 2017-11-28 NOTE — Assessment & Plan Note (Signed)
Follow cbc.  

## 2017-11-28 NOTE — Assessment & Plan Note (Signed)
Discussed with him today.  Tried cialis 5mg .  Tolerated, but feels needs higher dose.  Increased to 10mg .  Follow.

## 2017-12-06 ENCOUNTER — Other Ambulatory Visit: Payer: Self-pay | Admitting: Internal Medicine

## 2018-01-23 DIAGNOSIS — G608 Other hereditary and idiopathic neuropathies: Secondary | ICD-10-CM | POA: Diagnosis not present

## 2018-01-23 DIAGNOSIS — G5602 Carpal tunnel syndrome, left upper limb: Secondary | ICD-10-CM | POA: Diagnosis not present

## 2018-01-23 DIAGNOSIS — E538 Deficiency of other specified B group vitamins: Secondary | ICD-10-CM | POA: Diagnosis not present

## 2018-01-23 DIAGNOSIS — M21371 Foot drop, right foot: Secondary | ICD-10-CM | POA: Diagnosis not present

## 2018-01-23 DIAGNOSIS — M21372 Foot drop, left foot: Secondary | ICD-10-CM | POA: Diagnosis not present

## 2018-01-23 DIAGNOSIS — R269 Unspecified abnormalities of gait and mobility: Secondary | ICD-10-CM | POA: Diagnosis not present

## 2018-01-29 DIAGNOSIS — R262 Difficulty in walking, not elsewhere classified: Secondary | ICD-10-CM | POA: Diagnosis not present

## 2018-01-29 DIAGNOSIS — M799 Soft tissue disorder, unspecified: Secondary | ICD-10-CM | POA: Diagnosis not present

## 2018-01-29 DIAGNOSIS — R2681 Unsteadiness on feet: Secondary | ICD-10-CM | POA: Diagnosis not present

## 2018-01-29 DIAGNOSIS — M6281 Muscle weakness (generalized): Secondary | ICD-10-CM | POA: Diagnosis not present

## 2018-01-30 ENCOUNTER — Ambulatory Visit: Payer: Self-pay

## 2018-01-30 NOTE — Telephone Encounter (Signed)
Pt BP at physical therapy yesterday was 178/98. PT calling to get approval to continue PT due to elevated BP.  Asked pt to recheck BP. BP's were 184/90 and 183/88 taken 10 minutes apart. Pt stated he has had his BP machine checked and it correlated to the reading in the office. Pt denies any cardiac or neurological sx (headache, chest pain, blurred vision, difficulty breathing, weakness) Due to elevated BP pt needs appt within 24 hours. No availability with PCP. Appt made with Mable Paris FNP for tomorrow at 1:15 pm.   Reason for Disposition . Systolic BP  >= 338 OR Diastolic >= 250  Answer Assessment - Initial Assessment Questions 1. BLOOD PRESSURE: "What is the blood pressure?" "Did you take at least two measurements 5 minutes apart?"     Was 139/75 at PT visit. Had pt recheck BP 184/90 and repeat 183/88 2. ONSET: "When did you take your blood pressure?"     4:15 pm 4:25 pm 3. HOW: "How did you obtain the blood pressure?" (e.g., visiting nurse, automatic home BP monitor)     Automatic home BP monitor 4. HISTORY: "Do you have a history of high blood pressure?"     yes 5. MEDICATIONS: "Are you taking any medications for blood pressure?" "Have you missed any doses recently?"     Yes  6. OTHER SYMPTOMS: "Do you have any symptoms?" (e.g., headache, chest pain, blurred vision, difficulty breathing, weakness)     Denies any sx 7. PREGNANCY: "Is there any chance you are pregnant?" "When was your last menstrual period?"     n/a  Protocols used: HIGH BLOOD PRESSURE-A-AH

## 2018-01-31 ENCOUNTER — Ambulatory Visit (INDEPENDENT_AMBULATORY_CARE_PROVIDER_SITE_OTHER): Payer: Medicare Other | Admitting: Family

## 2018-01-31 ENCOUNTER — Encounter: Payer: Self-pay | Admitting: Family

## 2018-01-31 VITALS — BP 162/80 | HR 84 | Temp 98.3°F | Resp 16 | Ht 74.02 in | Wt 307.2 lb

## 2018-01-31 DIAGNOSIS — I10 Essential (primary) hypertension: Secondary | ICD-10-CM

## 2018-01-31 MED ORDER — CHLORTHALIDONE 25 MG PO TABS: 25.0000 mg | ORAL_TABLET | Freq: Every day | ORAL | 0 refills | Status: DC

## 2018-01-31 NOTE — Assessment & Plan Note (Signed)
Reassured by neurologic exam today. Elevated today,no signs of symptoms or HTN urgency or emergency at this time. Will dc HCTZ and start chlorlatholidone to see if more potent., Suspect weight/salt contributory. Advised no aerobic activity with PT until BP under control.

## 2018-01-31 NOTE — Progress Notes (Signed)
Subjective:    Patient ID: Charles Nichols, male    DOB: September 09, 1951, 67 y.o.   MRN: [id number]  CC: Charles Nichols is a 68 y.o. male who presents today for an acute visit.    HPI: HTN- has been elevated. At home yesterday 184/84 then 180/77. Today at home 155/76.   Typically hasnt had issues. Notes confrontation with someone and has been high since. Some weight gain. No increase in salt to diet. some ham last week with easter.  Denies exertional chest pain or pressure, numbness or tingling radiating to left arm or jaw, palpitations, dizziness, frequent headaches, changes in vision, or shortness of breath.    PT would not do extensive PT with him; needs note. Most strenuous exercise is riding bicycle.  Doing PT for footdrop in right foot. Following with neurology.      HISTORY:  Past Medical History:  Diagnosis Date  . Arthritis   . History of chicken pox   . Hypertension   . Obesity   . Peripheral vascular disease ([place])   . Peripheral vascular disease [facility])    Past Surgical History:  Procedure Laterality Date  . CIRCUMCISION    . COLONOSCOPY WITH PROPOFOL N/A 04/08/2015   Procedure: COLONOSCOPY WITH PROPOFOL;  Surgeon: Lollie Sails, MD;  Location: [facility] ENDOSCOPY;  Service: Endoscopy;  Laterality: N/A;  . ESOPHAGOGASTRODUODENOSCOPY N/A 04/08/2015   Procedure: ESOPHAGOGASTRODUODENOSCOPY (EGD);  Surgeon: Lollie Sails, MD;  Location: [facility] ENDOSCOPY;  Service: Endoscopy;  Laterality: N/A;  . FEMORAL-POPLITEAL BYPASS GRAFT Bilateral 1997, 2000  . leg stent     pvd   Family History  Problem Relation Age of Onset  . Breast cancer Sister   . Hypertension Sister   . Hypertension Brother   . Breast cancer Daughter     Allergies: Patient has no known allergies. Current Outpatient Medications on File Prior to Visit  Medication Sig Dispense Refill  . acetaminophen (TYLENOL) 325 MG tablet Take 650 mg by mouth every 6 (six) hours as needed for pain.    Marland Kitchen aspirin 81 MG tablet  Take 81 mg by mouth daily.    . Cholecalciferol (VITAMIN D3) 2000 UNITS TABS Take by mouth daily.    . felodipine (PLENDIL) 10 MG 24 hr tablet Take 1 tablet (10 mg total) daily by mouth. 90 tablet 1  . lisinopril (PRINIVIL,ZESTRIL) 20 MG tablet Take 2 tablets (40 mg total) by mouth daily. 180 tablet 3  . lovastatin (MEVACOR) 40 MG tablet Take 1 tablet (40 mg total) daily by mouth. 30 tablet 5  . tadalafil (CIALIS) 10 MG tablet One tablet q 72 hours prn 10 tablet 0  . vitamin B-12 (CYANOCOBALAMIN) 1000 MCG tablet Take 1,000 mcg by mouth daily.    . clobetasol cream (TEMOVATE) 3.15 % Apply 1 application topically 2 (two) times daily. (Patient not taking: Reported on 01/31/2018) 30 g 0   No current facility-administered medications on file prior to visit.     Social History   Tobacco Use  . Smoking status: Former Research scientist (life sciences)  . Smokeless tobacco: Never Used  Substance Use Topics  . Alcohol use: Yes    Alcohol/week: 0.0 oz  . Drug use: No    Review of Systems  Constitutional: Negative for chills and fever.  Respiratory: Negative for cough.   Cardiovascular: Negative for chest pain and palpitations.  Gastrointestinal: Negative for nausea and vomiting.      Objective:    BP (!) 162/80 (BP Location: Left Arm, Patient  Position: Sitting, Cuff Size: Large)   Pulse 84   Temp 98.3 F (36.8 C) (Oral)   Resp 16   Ht 6' 2.02" (1.88 m)   Wt (!) 307 lb 3.2 oz (139.3 kg)   SpO2 96%   BMI 39.42 kg/m  BP Readings from Last 3 Encounters:  01/31/18 (!) 162/80  11/28/17 128/84  08/22/17 138/76   Wt Readings from Last 3 Encounters:  01/31/18 (!) 307 lb 3.2 oz (139.3 kg)  11/25/17 (!) 300 lb 3.2 oz (136.2 kg)  08/22/17 296 lb 9.6 oz (134.5 kg)     Physical Exam  Constitutional: He appears well-developed and well-nourished.  HENT:  Right Ear: Hearing normal.  Left Ear: Hearing normal.  Mouth/Throat: Uvula is midline, oropharynx is clear and moist and mucous membranes are normal. No  posterior oropharyngeal edema or posterior oropharyngeal erythema.  Eyes: Pupils are equal, round, and reactive to light. Conjunctivae, EOM and lids are normal. Lids are everted and swept, no foreign bodies found.  Normal fundus bilaterally.  Cardiovascular: Regular rhythm and normal heart sounds.  Pulmonary/Chest: Effort normal and breath sounds normal. No respiratory distress. He has no wheezes. He has no rhonchi. He has no rales.  Lymphadenopathy:       Head (right side): No submental, no submandibular, no tonsillar, no preauricular, no posterior auricular and no occipital adenopathy present.       Head (left side): No submental, no submandibular, no tonsillar, no preauricular, no posterior auricular and no occipital adenopathy present.    He has no cervical adenopathy.  Neurological: He is alert. He has normal strength. No cranial nerve deficit or sensory deficit. He displays a negative Romberg sign.  Reflex Scores:      Bicep reflexes are 2+ on the right side and 2+ on the left side.      Patellar reflexes are 2+ on the right side and 2+ on the left side. Grip equal and strong bilateral upper extremities. Gait strong and steady. Able to perform rapid alternating movement without difficulty.  Skin: Skin is warm and dry.  Psychiatric: He has a normal mood and affect. His speech is normal and behavior is normal.  Vitals reviewed.      Assessment & Plan:   Problem List Items Addressed This Visit      Cardiovascular and Mediastinum   Essential hypertension, benign - Primary    Reassured by neurologic exam today. Elevated today,no signs of symptoms or HTN urgency or emergency at this time. Will dc HCTZ and start chlorlatholidone to see if more potent., Suspect weight/salt contributory. Advised no aerobic activity with PT until BP under control.       Relevant Medications   chlorthalidone (HYGROTON) 25 MG tablet   Other Relevant Orders   Basic metabolic panel        I have  discontinued Charles Nichols's terbinafine and hydrochlorothiazide. I am also having him start on chlorthalidone. Additionally, I am having him maintain his aspirin, Vitamin D3, acetaminophen, vitamin B-12, lisinopril, clobetasol cream, felodipine, lovastatin, and tadalafil.   Meds ordered this encounter  Medications  . chlorthalidone (HYGROTON) 25 MG tablet    Sig: Take 1 tablet (25 mg total) by mouth daily.    Dispense:  90 tablet    Refill:  0    Order Specific Question:   Supervising Provider    Answer:   Crecencio Mc [[id number]]    Return precautions given.   Risks, benefits, and alternatives of the medications and treatment  plan prescribed today were discussed, and patient expressed understanding.   Education regarding symptom management and diagnosis given to patient on AVS.  Continue to follow with Einar Pheasant, MD for routine health maintenance.   Zarin H Bergeron and I agreed with plan.   Mable Paris, FNP

## 2018-01-31 NOTE — Patient Instructions (Addendum)
STOP hctz  Start chlorthalidone.   Labs in two week  Monitor blood pressure,  Goal is less than 130/80; if persistently higher, please make sooner follow up appointment so we can recheck you blood pressure and manage medications   Low salt. Keep healthy weight   Managing Your Hypertension Hypertension is commonly called high blood pressure. This is when the force of your blood pressing against the walls of your arteries is too strong. Arteries are blood vessels that carry blood from your heart throughout your body. Hypertension forces the heart to work harder to pump blood, and may cause the arteries to become narrow or stiff. Having untreated or uncontrolled hypertension can cause heart attack, stroke, kidney disease, and other problems. What are blood pressure readings? A blood pressure reading consists of a higher number over a lower number. Ideally, your blood pressure should be below 120/80. The first ("top") number is called the systolic pressure. It is a measure of the pressure in your arteries as your heart beats. The second ("bottom") number is called the diastolic pressure. It is a measure of the pressure in your arteries as the heart relaxes. What does my blood pressure reading mean? Blood pressure is classified into four stages. Based on your blood pressure reading, your health care provider may use the following stages to determine what type of treatment you need, if any. Systolic pressure and diastolic pressure are measured in a unit called mm Hg. Normal  Systolic pressure: below 191.  Diastolic pressure: below 80. Elevated  Systolic pressure: 478-295.  Diastolic pressure: below 80. Hypertension stage 1  Systolic pressure: 621-308.  Diastolic pressure: 65-78. Hypertension stage 2  Systolic pressure: 469 or above.  Diastolic pressure: 90 or above. What health risks are associated with hypertension? Managing your hypertension is an important responsibility.  Uncontrolled hypertension can lead to:  A heart attack.  A stroke.  A weakened blood vessel (aneurysm).  Heart failure.  Kidney damage.  Eye damage.  Metabolic syndrome.  Memory and concentration problems.  What changes can I make to manage my hypertension? Hypertension can be managed by making lifestyle changes and possibly by taking medicines. Your health care provider will help you make a plan to bring your blood pressure within a normal range. Eating and drinking  Eat a diet that is high in fiber and potassium, and low in salt (sodium), added sugar, and fat. An example eating plan is called the DASH (Dietary Approaches to Stop Hypertension) diet. To eat this way: ? Eat plenty of fresh fruits and vegetables. Try to fill half of your plate at each meal with fruits and vegetables. ? Eat whole grains, such as whole wheat pasta, brown rice, or whole grain bread. Fill about one quarter of your plate with whole grains. ? Eat low-fat diary products. ? Avoid fatty cuts of meat, processed or cured meats, and poultry with skin. Fill about one quarter of your plate with lean proteins such as fish, chicken without skin, beans, eggs, and tofu. ? Avoid premade and processed foods. These tend to be higher in sodium, added sugar, and fat.  Reduce your daily sodium intake. Most people with hypertension should eat less than 1,500 mg of sodium a day.  Limit alcohol intake to no more than 1 drink a day for nonpregnant women and 2 drinks a day for men. One drink equals 12 oz of beer, 5 oz of wine, or 1 oz of hard liquor. Lifestyle  Work with your health care provider to  maintain a healthy body weight, or to lose weight. Ask what an ideal weight is for you.  Get at least 30 minutes of exercise that causes your heart to beat faster (aerobic exercise) most days of the week. Activities may include walking, swimming, or biking.  Include exercise to strengthen your muscles (resistance exercise), such  as weight lifting, as part of your weekly exercise routine. Try to do these types of exercises for 30 minutes at least 3 days a week.  Do not use any products that contain nicotine or tobacco, such as cigarettes and e-cigarettes. If you need help quitting, ask your health care provider.  Control any long-term (chronic) conditions you have, such as high cholesterol or diabetes. Monitoring  Monitor your blood pressure at home as told by your health care provider. Your personal target blood pressure may vary depending on your medical conditions, your age, and other factors.  Have your blood pressure checked regularly, as often as told by your health care provider. Working with your health care provider  Review all the medicines you take with your health care provider because there may be side effects or interactions.  Talk with your health care provider about your diet, exercise habits, and other lifestyle factors that may be contributing to hypertension.  Visit your health care provider regularly. Your health care provider can help you create and adjust your plan for managing hypertension. Will I need medicine to control my blood pressure? Your health care provider may prescribe medicine if lifestyle changes are not enough to get your blood pressure under control, and if:  Your systolic blood pressure is 130 or higher.  Your diastolic blood pressure is 80 or higher.  Take medicines only as told by your health care provider. Follow the directions carefully. Blood pressure medicines must be taken as prescribed. The medicine does not work as well when you skip doses. Skipping doses also puts you at risk for problems. Contact a health care provider if:  You think you are having a reaction to medicines you have taken.  You have repeated (recurrent) headaches.  You feel dizzy.  You have swelling in your ankles.  You have trouble with your vision. Get help right away if:  You develop a  severe headache or confusion.  You have unusual weakness or numbness, or you feel faint.  You have severe pain in your chest or abdomen.  You vomit repeatedly.  You have trouble breathing. Summary  Hypertension is when the force of blood pumping through your arteries is too strong. If this condition is not controlled, it may put you at risk for serious complications.  Your personal target blood pressure may vary depending on your medical conditions, your age, and other factors. For most people, a normal blood pressure is less than 120/80.  Hypertension is managed by lifestyle changes, medicines, or both. Lifestyle changes include weight loss, eating a healthy, low-sodium diet, exercising more, and limiting alcohol. This information is not intended to replace advice given to you by your health care provider. Make sure you discuss any questions you have with your health care provider. Document Released: 06/18/2012 Document Revised: 08/22/2016 Document Reviewed: 08/22/2016 Elsevier Interactive Patient Education  [employer].

## 2018-02-04 DIAGNOSIS — M799 Soft tissue disorder, unspecified: Secondary | ICD-10-CM | POA: Diagnosis not present

## 2018-02-04 DIAGNOSIS — M6281 Muscle weakness (generalized): Secondary | ICD-10-CM | POA: Diagnosis not present

## 2018-02-04 DIAGNOSIS — R2681 Unsteadiness on feet: Secondary | ICD-10-CM | POA: Diagnosis not present

## 2018-02-04 DIAGNOSIS — R262 Difficulty in walking, not elsewhere classified: Secondary | ICD-10-CM | POA: Diagnosis not present

## 2018-02-07 DIAGNOSIS — R262 Difficulty in walking, not elsewhere classified: Secondary | ICD-10-CM | POA: Diagnosis not present

## 2018-02-07 DIAGNOSIS — M799 Soft tissue disorder, unspecified: Secondary | ICD-10-CM | POA: Diagnosis not present

## 2018-02-07 DIAGNOSIS — M6281 Muscle weakness (generalized): Secondary | ICD-10-CM | POA: Diagnosis not present

## 2018-02-07 DIAGNOSIS — R2681 Unsteadiness on feet: Secondary | ICD-10-CM | POA: Diagnosis not present

## 2018-02-11 DIAGNOSIS — R262 Difficulty in walking, not elsewhere classified: Secondary | ICD-10-CM | POA: Diagnosis not present

## 2018-02-11 DIAGNOSIS — M799 Soft tissue disorder, unspecified: Secondary | ICD-10-CM | POA: Diagnosis not present

## 2018-02-11 DIAGNOSIS — M6281 Muscle weakness (generalized): Secondary | ICD-10-CM | POA: Diagnosis not present

## 2018-02-11 DIAGNOSIS — R2681 Unsteadiness on feet: Secondary | ICD-10-CM | POA: Diagnosis not present

## 2018-02-13 DIAGNOSIS — M799 Soft tissue disorder, unspecified: Secondary | ICD-10-CM | POA: Diagnosis not present

## 2018-02-13 DIAGNOSIS — R2681 Unsteadiness on feet: Secondary | ICD-10-CM | POA: Diagnosis not present

## 2018-02-13 DIAGNOSIS — M6281 Muscle weakness (generalized): Secondary | ICD-10-CM | POA: Diagnosis not present

## 2018-02-13 DIAGNOSIS — R262 Difficulty in walking, not elsewhere classified: Secondary | ICD-10-CM | POA: Diagnosis not present

## 2018-02-14 ENCOUNTER — Ambulatory Visit: Payer: Medicare Other | Admitting: Family

## 2018-02-17 DIAGNOSIS — R2681 Unsteadiness on feet: Secondary | ICD-10-CM | POA: Diagnosis not present

## 2018-02-17 DIAGNOSIS — M6281 Muscle weakness (generalized): Secondary | ICD-10-CM | POA: Diagnosis not present

## 2018-02-17 DIAGNOSIS — R262 Difficulty in walking, not elsewhere classified: Secondary | ICD-10-CM | POA: Diagnosis not present

## 2018-02-17 DIAGNOSIS — M799 Soft tissue disorder, unspecified: Secondary | ICD-10-CM | POA: Diagnosis not present

## 2018-02-19 DIAGNOSIS — M6281 Muscle weakness (generalized): Secondary | ICD-10-CM | POA: Diagnosis not present

## 2018-02-19 DIAGNOSIS — R2681 Unsteadiness on feet: Secondary | ICD-10-CM | POA: Diagnosis not present

## 2018-02-19 DIAGNOSIS — R262 Difficulty in walking, not elsewhere classified: Secondary | ICD-10-CM | POA: Diagnosis not present

## 2018-02-19 DIAGNOSIS — M799 Soft tissue disorder, unspecified: Secondary | ICD-10-CM | POA: Diagnosis not present

## 2018-02-21 ENCOUNTER — Ambulatory Visit (INDEPENDENT_AMBULATORY_CARE_PROVIDER_SITE_OTHER): Payer: Medicare Other | Admitting: Family

## 2018-02-21 ENCOUNTER — Encounter: Payer: Self-pay | Admitting: Family

## 2018-02-21 VITALS — BP 138/84 | HR 78 | Temp 97.9°F | Resp 16 | Wt 298.0 lb

## 2018-02-21 DIAGNOSIS — E7800 Pure hypercholesterolemia, unspecified: Secondary | ICD-10-CM | POA: Diagnosis not present

## 2018-02-21 DIAGNOSIS — Z1159 Encounter for screening for other viral diseases: Secondary | ICD-10-CM | POA: Diagnosis not present

## 2018-02-21 DIAGNOSIS — R739 Hyperglycemia, unspecified: Secondary | ICD-10-CM

## 2018-02-21 DIAGNOSIS — I10 Essential (primary) hypertension: Secondary | ICD-10-CM

## 2018-02-21 LAB — HEPATIC FUNCTION PANEL
ALT: 20 U/L (ref 0–53)
AST: 20 U/L (ref 0–37)
Albumin: 4.1 g/dL (ref 3.5–5.2)
Alkaline Phosphatase: 55 U/L (ref 39–117)
Bilirubin, Direct: 0.1 mg/dL (ref 0.0–0.3)
Total Bilirubin: 0.4 mg/dL (ref 0.2–1.2)
Total Protein: 7.9 g/dL (ref 6.0–8.3)

## 2018-02-21 LAB — CBC WITH DIFFERENTIAL/PLATELET
Basophils Absolute: 0 10*3/uL (ref 0.0–0.1)
Basophils Relative: 0.3 % (ref 0.0–3.0)
Eosinophils Absolute: 0.2 10*3/uL (ref 0.0–0.7)
Eosinophils Relative: 3.2 % (ref 0.0–5.0)
HCT: 38 % — ABNORMAL LOW (ref 39.0–52.0)
Hemoglobin: 12.8 g/dL — ABNORMAL LOW (ref 13.0–17.0)
Lymphocytes Relative: 22.5 % (ref 12.0–46.0)
Lymphs Abs: 1.4 10*3/uL (ref 0.7–4.0)
MCHC: 33.7 g/dL (ref 30.0–36.0)
MCV: 81.2 fl (ref 78.0–100.0)
Monocytes Absolute: 0.6 10*3/uL (ref 0.1–1.0)
Monocytes Relative: 9.4 % (ref 3.0–12.0)
Neutro Abs: 3.9 10*3/uL (ref 1.4–7.7)
Neutrophils Relative %: 64.6 % (ref 43.0–77.0)
Platelets: 227 10*3/uL (ref 150.0–400.0)
RBC: 4.67 Mil/uL (ref 4.22–5.81)
RDW: 13.6 % (ref 11.5–15.5)
WBC: 6.1 10*3/uL (ref 4.0–10.5)

## 2018-02-21 LAB — BASIC METABOLIC PANEL
BUN: 18 mg/dL (ref 6–23)
CO2: 30 meq/L (ref 19–32)
Calcium: 10 mg/dL (ref 8.4–10.5)
Chloride: 99 mEq/L (ref 96–112)
Creatinine, Ser: 0.95 mg/dL (ref 0.40–1.50)
GFR: 101.71 mL/min (ref >60.00)
GLUCOSE: 95 mg/dL (ref 70–99)
POTASSIUM: 3.7 meq/L (ref 3.5–5.1)
SODIUM: 137 meq/L (ref 135–145)

## 2018-02-21 LAB — LIPID PANEL
Cholesterol: 113 mg/dL (ref 0–200)
HDL: 31.3 mg/dL — ABNORMAL LOW (ref >39.00)
LDL Cholesterol: 53 mg/dL (ref 0–99)
NonHDL: 81.41
Total CHOL/HDL Ratio: 4
Triglycerides: 140 mg/dL (ref 0.0–149.0)
VLDL: 28 mg/dL (ref 0.0–40.0)

## 2018-02-21 LAB — HEMOGLOBIN A1C: Hgb A1c MFr Bld: 5.9 % (ref 4.6–6.5)

## 2018-02-21 LAB — TSH: TSH: 4.62 u[IU]/mL — ABNORMAL HIGH (ref 0.35–4.50)

## 2018-02-21 NOTE — Progress Notes (Signed)
Subjective:    Patient ID: Charles Nichols, male    DOB: 18-Nov-1950, 67 y.o.   MRN: [id number]  CC: Charles Nichols is a 67 y.o. male who presents today for follow up.   HPI: HTN- on chlorthalidone. At home 132/71.   Denies exertional chest pain or pressure, numbness or tingling radiating to left arm or jaw, palpitations, dizziness, frequent headaches, changes in vision, or shortness of breath.   Would like letter she can respectfully physical therapy for his foot drop.   Consents to Hep C screen.      HISTORY:  Past Medical History:  Diagnosis Date  . Arthritis   . History of chicken pox   . Hypertension   . Obesity   . Peripheral vascular disease ([place])   . Peripheral vascular disease [facility])    Past Surgical History:  Procedure Laterality Date  . CIRCUMCISION    . COLONOSCOPY WITH PROPOFOL N/A 04/08/2015   Procedure: COLONOSCOPY WITH PROPOFOL;  Surgeon: Lollie Sails, MD;  Location: [facility] ENDOSCOPY;  Service: Endoscopy;  Laterality: N/A;  . ESOPHAGOGASTRODUODENOSCOPY N/A 04/08/2015   Procedure: ESOPHAGOGASTRODUODENOSCOPY (EGD);  Surgeon: Lollie Sails, MD;  Location: [facility] ENDOSCOPY;  Service: Endoscopy;  Laterality: N/A;  . FEMORAL-POPLITEAL BYPASS GRAFT Bilateral 1997, 2000  . leg stent     pvd   Family History  Problem Relation Age of Onset  . Breast cancer Sister   . Hypertension Sister   . Hypertension Brother   . Breast cancer Daughter     Allergies: Patient has no known allergies. Current Outpatient Medications on File Prior to Visit  Medication Sig Dispense Refill  . acetaminophen (TYLENOL) 325 MG tablet Take 650 mg by mouth every 6 (six) hours as needed for pain.    Marland Kitchen aspirin 81 MG tablet Take 81 mg by mouth daily.    . chlorthalidone (HYGROTON) 25 MG tablet Take 1 tablet (25 mg total) by mouth daily. 90 tablet 0  . Cholecalciferol (VITAMIN D3) 2000 UNITS TABS Take by mouth daily.    . felodipine (PLENDIL) 10 MG 24 hr tablet Take 1 tablet (10 mg total)  daily by mouth. 90 tablet 1  . lisinopril (PRINIVIL,ZESTRIL) 20 MG tablet Take 2 tablets (40 mg total) by mouth daily. 180 tablet 3  . lovastatin (MEVACOR) 40 MG tablet Take 1 tablet (40 mg total) daily by mouth. 30 tablet 5  . tadalafil (CIALIS) 10 MG tablet One tablet q 72 hours prn 10 tablet 0  . vitamin B-12 (CYANOCOBALAMIN) 1000 MCG tablet Take 1,000 mcg by mouth daily.    . clobetasol cream (TEMOVATE) 2.77 % Apply 1 application topically 2 (two) times daily. (Patient not taking: Reported on 01/31/2018) 30 g 0   No current facility-administered medications on file prior to visit.     Social History   Tobacco Use  . Smoking status: Former Research scientist (life sciences)  . Smokeless tobacco: Never Used  Substance Use Topics  . Alcohol use: Yes    Alcohol/week: 0.0 oz  . Drug use: No    Review of Systems  Constitutional: Negative for chills and fever.  Respiratory: Negative for cough and shortness of breath.   Cardiovascular: Negative for chest pain and palpitations.  Gastrointestinal: Negative for nausea and vomiting.  Neurological: Negative for dizziness and headaches.      Objective:    BP 138/84 (BP Location: Left Arm, Patient Position: Sitting, Cuff Size: Normal)   Pulse 78   Temp 97.9 F (36.6 C) (Oral)  Resp 16   Wt 298 lb (135.2 kg)   SpO2 98%   BMI 38.24 kg/m  BP Readings from Last 3 Encounters:  02/21/18 138/84  01/31/18 (!) 162/80  11/28/17 128/84   Wt Readings from Last 3 Encounters:  02/21/18 298 lb (135.2 kg)  01/31/18 (!) 307 lb 3.2 oz (139.3 kg)  11/25/17 (!) 300 lb 3.2 oz (136.2 kg)    Physical Exam  Constitutional: He appears well-developed and well-nourished.  Cardiovascular: Regular rhythm and normal heart sounds.  Pulmonary/Chest: Effort normal and breath sounds normal. No respiratory distress. He has no wheezes. He has no rhonchi. He has no rales.  Lymphadenopathy:       Head (left side): No submandibular and no preauricular adenopathy present.  Neurological:  He is alert.  Skin: Skin is warm and dry.  Psychiatric: He has a normal mood and affect. His speech is normal and behavior is normal.  Vitals reviewed.      Assessment & Plan:   Problem List Items Addressed This Visit      Cardiovascular and Mediastinum   Essential hypertension, benign - Primary    Improved.  Continue current regimen.  Letter provided that he may continue physical therapy with no limitations.  Pending BMP today.  Patient to follow-up with PCP.        Other   Hypercholesterolemia    Other Visit Diagnoses    Hyperglycemia       Encounter for hepatitis C screening test for low risk patient       Relevant Orders   Hepatitis C Antibody       I am having Maverick H. Joy maintain his aspirin, Vitamin D3, acetaminophen, vitamin B-12, lisinopril, clobetasol cream, felodipine, lovastatin, tadalafil, and chlorthalidone.   No orders of the defined types were placed in this encounter.   Return precautions given.   Risks, benefits, and alternatives of the medications and treatment plan prescribed today were discussed, and patient expressed understanding.   Education regarding symptom management and diagnosis given to patient on AVS.  Continue to follow with Einar Pheasant, MD for routine health maintenance.   Charles Nichols and I agreed with plan.   Charles Paris, FNP

## 2018-02-21 NOTE — Patient Instructions (Signed)
You look great.   Labs today.   Monitor blood pressure,  Goal is less than 130/80; if persistently higher, please make sooner follow up appointment so we can recheck you blood pressure and manage medications.

## 2018-02-21 NOTE — Assessment & Plan Note (Signed)
Improved.  Continue current regimen.  Letter provided that he may continue physical therapy with no limitations.  Pending BMP today.  Patient to follow-up with PCP.

## 2018-02-23 LAB — HEPATITIS C ANTIBODY
HEP C AB: NONREACTIVE
SIGNAL TO CUT-OFF: 0.07 (ref <1.00)

## 2018-02-24 ENCOUNTER — Other Ambulatory Visit: Payer: Self-pay | Admitting: Internal Medicine

## 2018-02-24 DIAGNOSIS — G629 Polyneuropathy, unspecified: Secondary | ICD-10-CM

## 2018-02-24 DIAGNOSIS — I10 Essential (primary) hypertension: Secondary | ICD-10-CM

## 2018-02-24 DIAGNOSIS — D649 Anemia, unspecified: Secondary | ICD-10-CM

## 2018-02-24 DIAGNOSIS — R7989 Other specified abnormal findings of blood chemistry: Secondary | ICD-10-CM

## 2018-02-24 NOTE — Progress Notes (Signed)
Order placed for f/u labs.  

## 2018-02-25 DIAGNOSIS — R2681 Unsteadiness on feet: Secondary | ICD-10-CM | POA: Diagnosis not present

## 2018-02-25 DIAGNOSIS — M799 Soft tissue disorder, unspecified: Secondary | ICD-10-CM | POA: Diagnosis not present

## 2018-02-25 DIAGNOSIS — M6281 Muscle weakness (generalized): Secondary | ICD-10-CM | POA: Diagnosis not present

## 2018-02-25 DIAGNOSIS — R262 Difficulty in walking, not elsewhere classified: Secondary | ICD-10-CM | POA: Diagnosis not present

## 2018-02-27 DIAGNOSIS — R262 Difficulty in walking, not elsewhere classified: Secondary | ICD-10-CM | POA: Diagnosis not present

## 2018-02-27 DIAGNOSIS — M799 Soft tissue disorder, unspecified: Secondary | ICD-10-CM | POA: Diagnosis not present

## 2018-02-27 DIAGNOSIS — R2681 Unsteadiness on feet: Secondary | ICD-10-CM | POA: Diagnosis not present

## 2018-02-27 DIAGNOSIS — M6281 Muscle weakness (generalized): Secondary | ICD-10-CM | POA: Diagnosis not present

## 2018-03-04 DIAGNOSIS — M6281 Muscle weakness (generalized): Secondary | ICD-10-CM | POA: Diagnosis not present

## 2018-03-04 DIAGNOSIS — R262 Difficulty in walking, not elsewhere classified: Secondary | ICD-10-CM | POA: Diagnosis not present

## 2018-03-04 DIAGNOSIS — M799 Soft tissue disorder, unspecified: Secondary | ICD-10-CM | POA: Diagnosis not present

## 2018-03-04 DIAGNOSIS — R2681 Unsteadiness on feet: Secondary | ICD-10-CM | POA: Diagnosis not present

## 2018-03-06 DIAGNOSIS — M799 Soft tissue disorder, unspecified: Secondary | ICD-10-CM | POA: Diagnosis not present

## 2018-03-06 DIAGNOSIS — R262 Difficulty in walking, not elsewhere classified: Secondary | ICD-10-CM | POA: Diagnosis not present

## 2018-03-06 DIAGNOSIS — R2681 Unsteadiness on feet: Secondary | ICD-10-CM | POA: Diagnosis not present

## 2018-03-06 DIAGNOSIS — M6281 Muscle weakness (generalized): Secondary | ICD-10-CM | POA: Diagnosis not present

## 2018-03-10 DIAGNOSIS — M799 Soft tissue disorder, unspecified: Secondary | ICD-10-CM | POA: Diagnosis not present

## 2018-03-10 DIAGNOSIS — R262 Difficulty in walking, not elsewhere classified: Secondary | ICD-10-CM | POA: Diagnosis not present

## 2018-03-10 DIAGNOSIS — R2681 Unsteadiness on feet: Secondary | ICD-10-CM | POA: Diagnosis not present

## 2018-03-10 DIAGNOSIS — M6281 Muscle weakness (generalized): Secondary | ICD-10-CM | POA: Diagnosis not present

## 2018-03-13 DIAGNOSIS — M6281 Muscle weakness (generalized): Secondary | ICD-10-CM | POA: Diagnosis not present

## 2018-03-13 DIAGNOSIS — M799 Soft tissue disorder, unspecified: Secondary | ICD-10-CM | POA: Diagnosis not present

## 2018-03-13 DIAGNOSIS — R262 Difficulty in walking, not elsewhere classified: Secondary | ICD-10-CM | POA: Diagnosis not present

## 2018-03-13 DIAGNOSIS — R2681 Unsteadiness on feet: Secondary | ICD-10-CM | POA: Diagnosis not present

## 2018-03-17 DIAGNOSIS — R262 Difficulty in walking, not elsewhere classified: Secondary | ICD-10-CM | POA: Diagnosis not present

## 2018-03-17 DIAGNOSIS — M799 Soft tissue disorder, unspecified: Secondary | ICD-10-CM | POA: Diagnosis not present

## 2018-03-17 DIAGNOSIS — R2681 Unsteadiness on feet: Secondary | ICD-10-CM | POA: Diagnosis not present

## 2018-03-17 DIAGNOSIS — M6281 Muscle weakness (generalized): Secondary | ICD-10-CM | POA: Diagnosis not present

## 2018-03-19 DIAGNOSIS — M6281 Muscle weakness (generalized): Secondary | ICD-10-CM | POA: Diagnosis not present

## 2018-03-19 DIAGNOSIS — R262 Difficulty in walking, not elsewhere classified: Secondary | ICD-10-CM | POA: Diagnosis not present

## 2018-03-19 DIAGNOSIS — R2681 Unsteadiness on feet: Secondary | ICD-10-CM | POA: Diagnosis not present

## 2018-03-19 DIAGNOSIS — M799 Soft tissue disorder, unspecified: Secondary | ICD-10-CM | POA: Diagnosis not present

## 2018-03-24 DIAGNOSIS — M6281 Muscle weakness (generalized): Secondary | ICD-10-CM | POA: Diagnosis not present

## 2018-03-24 DIAGNOSIS — M799 Soft tissue disorder, unspecified: Secondary | ICD-10-CM | POA: Diagnosis not present

## 2018-03-24 DIAGNOSIS — R2681 Unsteadiness on feet: Secondary | ICD-10-CM | POA: Diagnosis not present

## 2018-03-24 DIAGNOSIS — R262 Difficulty in walking, not elsewhere classified: Secondary | ICD-10-CM | POA: Diagnosis not present

## 2018-03-26 DIAGNOSIS — M6281 Muscle weakness (generalized): Secondary | ICD-10-CM | POA: Diagnosis not present

## 2018-03-26 DIAGNOSIS — R2681 Unsteadiness on feet: Secondary | ICD-10-CM | POA: Diagnosis not present

## 2018-03-26 DIAGNOSIS — R262 Difficulty in walking, not elsewhere classified: Secondary | ICD-10-CM | POA: Diagnosis not present

## 2018-03-26 DIAGNOSIS — M799 Soft tissue disorder, unspecified: Secondary | ICD-10-CM | POA: Diagnosis not present

## 2018-03-27 ENCOUNTER — Other Ambulatory Visit: Payer: Self-pay

## 2018-03-27 ENCOUNTER — Ambulatory Visit (INDEPENDENT_AMBULATORY_CARE_PROVIDER_SITE_OTHER): Payer: Medicare Other

## 2018-03-27 ENCOUNTER — Other Ambulatory Visit (INDEPENDENT_AMBULATORY_CARE_PROVIDER_SITE_OTHER): Payer: Medicare Other

## 2018-03-27 VITALS — BP 138/74 | HR 77 | Temp 98.3°F | Resp 15 | Ht 73.0 in | Wt 309.8 lb

## 2018-03-27 DIAGNOSIS — Z0000 Encounter for general adult medical examination without abnormal findings: Secondary | ICD-10-CM | POA: Diagnosis not present

## 2018-03-27 DIAGNOSIS — D649 Anemia, unspecified: Secondary | ICD-10-CM

## 2018-03-27 DIAGNOSIS — I10 Essential (primary) hypertension: Secondary | ICD-10-CM

## 2018-03-27 LAB — CBC WITH DIFFERENTIAL/PLATELET
BASOS ABS: 0 10*3/uL (ref 0.0–0.1)
Basophils Relative: 0.2 % (ref 0.0–3.0)
EOS PCT: 4.4 % (ref 0.0–5.0)
Eosinophils Absolute: 0.4 10*3/uL (ref 0.0–0.7)
HCT: 37.9 % — ABNORMAL LOW (ref 39.0–52.0)
HEMOGLOBIN: 12.6 g/dL — AB (ref 13.0–17.0)
Lymphocytes Relative: 23.3 % (ref 12.0–46.0)
Lymphs Abs: 2 10*3/uL (ref 0.7–4.0)
MCHC: 33.4 g/dL (ref 30.0–36.0)
MCV: 82.6 fl (ref 78.0–100.0)
Monocytes Absolute: 1 10*3/uL (ref 0.1–1.0)
Monocytes Relative: 11.7 % (ref 3.0–12.0)
NEUTROS PCT: 60.4 % (ref 43.0–77.0)
Neutro Abs: 5.2 10*3/uL (ref 1.4–7.7)
Platelets: 223 10*3/uL (ref 150.0–400.0)
RBC: 4.58 Mil/uL (ref 4.22–5.81)
RDW: 14.1 % (ref 11.5–15.5)
WBC: 8.6 10*3/uL (ref 4.0–10.5)

## 2018-03-27 LAB — BASIC METABOLIC PANEL
BUN: 17 mg/dL (ref 6–23)
CALCIUM: 10 mg/dL (ref 8.4–10.5)
CHLORIDE: 100 meq/L (ref 96–112)
CO2: 28 meq/L (ref 19–32)
CREATININE: 0.95 mg/dL (ref 0.40–1.50)
GFR: 101.69 mL/min (ref >60.00)
GLUCOSE: 98 mg/dL (ref 70–99)
Potassium: 3.9 mEq/L (ref 3.5–5.1)
Sodium: 137 mEq/L (ref 135–145)

## 2018-03-27 LAB — IBC PANEL
Iron: 92 ug/dL (ref 42–165)
Saturation Ratios: 25.7 % (ref 20.0–50.0)
Transferrin: 256 mg/dL (ref 212.0–360.0)

## 2018-03-27 LAB — FERRITIN: FERRITIN: 598.1 ng/mL — AB (ref 22.0–322.0)

## 2018-03-27 LAB — TSH: TSH: 3.37 u[IU]/mL (ref 0.35–4.50)

## 2018-03-27 NOTE — Progress Notes (Signed)
Subjective:   Charles Nichols is a 67 y.o. male who presents for an Initial Medicare Annual Wellness Visit.  Review of Systems  No ROS.  Medicare Wellness Visit. Additional risk factors are reflected in the social history.  Cardiac Risk Factors include: advanced age (>64men, >4 women);hypertension;male gender    Objective:    Today's Vitals   03/27/18 1428  BP: 138/74  Pulse: 77  Resp: 15  Temp: 98.3 F (36.8 C)  TempSrc: Oral  SpO2: 97%  Weight: (!) 309 lb 12.8 oz (140.5 kg)  Height: 6\' 1"  (1.854 m)   Body mass index is 40.87 kg/m.  Advanced Directives 03/27/2018 03/16/2016 04/08/2015  Does Patient Have a Medical Advance Directive? No No No  Would patient like information on creating a medical advance directive? Yes (MAU/Ambulatory/Procedural Areas - Information given) Yes - Educational materials given -    Current Medications (verified) Outpatient Encounter Medications as of 03/27/2018  Medication Sig  . acetaminophen (TYLENOL) 325 MG tablet Take 650 mg by mouth every 6 (six) hours as needed for pain.  Marland Kitchen aspirin 81 MG tablet Take 81 mg by mouth daily.  . chlorthalidone (HYGROTON) 25 MG tablet Take 1 tablet (25 mg total) by mouth daily.  . Cholecalciferol (VITAMIN D3) 2000 UNITS TABS Take by mouth daily.  . clobetasol cream (TEMOVATE) 5.18 % Apply 1 application topically 2 (two) times daily.  . felodipine (PLENDIL) 10 MG 24 hr tablet Take 1 tablet (10 mg total) daily by mouth.  Marland Kitchen lisinopril (PRINIVIL,ZESTRIL) 20 MG tablet Take 2 tablets (40 mg total) by mouth daily.  Marland Kitchen lovastatin (MEVACOR) 40 MG tablet Take 1 tablet (40 mg total) daily by mouth.  . tadalafil (CIALIS) 10 MG tablet One tablet q 72 hours prn  . vitamin B-12 (CYANOCOBALAMIN) 1000 MCG tablet Take 1,000 mcg by mouth daily.   No facility-administered encounter medications on file as of 03/27/2018.     Allergies (verified) Patient has no known allergies.   History: Past Medical History:  Diagnosis Date  .  Arthritis   . History of chicken pox   . Hypertension   . Obesity   . Peripheral vascular disease ([place])   . Peripheral vascular disease [facility])    Past Surgical History:  Procedure Laterality Date  . CIRCUMCISION    . COLONOSCOPY WITH PROPOFOL N/A 04/08/2015   Procedure: COLONOSCOPY WITH PROPOFOL;  Surgeon: Lollie Sails, MD;  Location: [facility] ENDOSCOPY;  Service: Endoscopy;  Laterality: N/A;  . ESOPHAGOGASTRODUODENOSCOPY N/A 04/08/2015   Procedure: ESOPHAGOGASTRODUODENOSCOPY (EGD);  Surgeon: Lollie Sails, MD;  Location: [facility] ENDOSCOPY;  Service: Endoscopy;  Laterality: N/A;  . FEMORAL-POPLITEAL BYPASS GRAFT Bilateral 1997, 2000  . leg stent     pvd   Family History  Problem Relation Age of Onset  . Breast cancer Sister   . Hypertension Sister   . Hypertension Brother   . Breast cancer Daughter    Social History   Socioeconomic History  . Marital status: Legally Separated    Spouse name: Not on file  . Number of children: Not on file  . Years of education: Not on file  . Highest education level: Not on file  Occupational History  . Not on file  Social Needs  . Financial resource strain: Not on file  . Food insecurity:    Worry: Not on file    Inability: Not on file  . Transportation needs:    Medical: Not on file    Non-medical: Not on file  Tobacco Use  . Smoking status: Former Research scientist (life sciences)  . Smokeless tobacco: Never Used  Substance and Sexual Activity  . Alcohol use: Yes    Alcohol/week: 0.0 oz  . Drug use: No  . Sexual activity: Not on file  Lifestyle  . Physical activity:    Days per week: Not on file    Minutes per session: Not on file  . Stress: Not on file  Relationships  . Social connections:    Talks on phone: Not on file    Gets together: Not on file    Attends religious service: Not on file    Active member of club or organization: Not on file    Attends meetings of clubs or organizations: Not on file    Relationship status: Not on file  Other  Topics Concern  . Not on file  Social History Narrative  . Not on file   Tobacco Counseling Counseling given: Not Answered   Clinical Intake:  Pre-visit preparation completed: Yes  Pain : No/denies pain     Nutritional Status: BMI > 30  Obese Diabetes: No  How often do you need to have someone help you when you read instructions, pamphlets, or other written materials from your doctor or pharmacy?: 1 - Never  Interpreter Needed?: No     Activities of Daily Living In your present state of health, do you have any difficulty performing the following activities: 03/27/2018  Hearing? N  Vision? N  Difficulty concentrating or making decisions? N  Walking or climbing stairs? Y  Dressing or bathing? N  Doing errands, shopping? N  Preparing Food and eating ? N  Using the Toilet? N  In the past six months, have you accidently leaked urine? N  Do you have problems with loss of bowel control? N  Managing your Medications? N  Managing your Finances? N  Housekeeping or managing your Housekeeping? N  Some recent data might be hidden     Immunizations and Health Maintenance Immunization History  Administered Date(s) Administered  . Influenza Split 06/29/2013, 09/21/2014  . Influenza, High Dose Seasonal PF 08/22/2016, 08/22/2017  . Influenza-Unspecified 07/03/2015  . Pneumococcal Conjugate-13 04/12/2017  . Zoster 11/02/2014   Health Maintenance Due  Topic Date Due  . Samul Dada  03/12/1970    Patient Care Team: Einar Pheasant, MD as PCP - General (Internal Medicine)  Indicate any recent Medical Services you may have received from other than [facility] providers in the past year (date may be approximate).    Assessment:   This is a routine wellness examination for [name].  The goal of the wellness visit is to assist the patient how to close the gaps in care and create a preventative care plan for the patient.   The roster of all physicians providing medical care to patient  is listed in the Snapshot section of the chart.  Taking calcium VIT D as appropriate/Osteoporosis risk reviewed.    Safety issues reviewed; Smoke and carbon monoxide detectors in the home. No firearms in the home. Wears seatbelts when driving or riding with others. No violence in the home.  They do not have excessive sun exposure.  Discussed the need for sun protection: hats, long sleeves and the use of sunscreen if there is significant sun exposure.  Patient is alert, normal appearance, oriented to person/place/and time. Correctly identified the president of the [place] and recalls of 5/5 words.Performs simple calculations and can read correct time from watch face. Displays appropriate judgement.  No new  identified risk were noted.  No failures at ADL's or IADL's.  Ambulates with cane as needed.   BMI- discussed the importance of a healthy diet, water intake and the benefits of aerobic exercise. Educational material provided.   24 hour diet recall: Regular diet  Dental- every 6 months.   Eye- Visual acuity not assessed per patient preference.  Wears corrective lenses.  Sleep patterns- Sleeps 6-7 hours at night.  Wakes feeling rested.   TDAP vaccine deferred per patient preference.  Follow up with insurance.  Educational material provided.  Hypertension- followed by pcp.  He reports blood pressures at home consistent below 140/80.  Continue to monitor at home.  If persistently higher, please make sooner follow up appointment so we can recheck your blood pressure and manage medications.   Hearing/Vision screen Hearing Screening Comments: Patient is able to hear conversational tones without difficulty.  No issues reported.  Vision Screening Comments: Followed by [facility] Wears corrective lenses Visual acuity not assessed per patient preference since they have regular follow up with the ophthalmologist  Dietary issues and exercise activities discussed: Current Exercise Habits:  Home exercise routine, Time (Minutes): 60, Frequency (Times/Week): 2, Weekly Exercise (Minutes/Week): 120, Intensity: Mild  Goals    . Maintain Healthy Lifestyle     Continue to monitor blood pressure at home Continue physical therapy as directed Healthy diet Stay hydrated      Depression Screen PHQ 2/9 Scores 03/27/2018 08/22/2017 08/22/2016 11/19/2014  PHQ - 2 Score 0 0 0 0    Fall Risk Fall Risk  03/27/2018 08/22/2017 08/22/2016 11/19/2014  Falls in the past year? No No No No   Cognitive Function: MMSE - Mini Mental State Exam 03/27/2018  Orientation to time 5  Orientation to Place 5  Registration 3  Attention/ Calculation 5  Recall 3  Language- name 2 objects 2  Language- repeat 1  Language- follow 3 step command 3  Language- read & follow direction 1  Write a sentence 1  Copy design 1  Total score 30        Screening Tests Health Maintenance  Topic Date Due  . TETANUS/TDAP  03/12/1970  . PNA vac Low Risk Adult (2 of 2 - PPSV23) 04/12/2018  . INFLUENZA VACCINE  05/08/2018  . COLONOSCOPY  04/07/2020  . Hepatitis C Screening  Completed      Plan:    End of life planning; Advance aging; Advanced directives discussed. Copy of current HCPOA/Living Will requested upon completion.     I have personally reviewed and noted the following in the patient's chart:   . Medical and social history . Use of alcohol, tobacco or illicit drugs  . Current medications and supplements . Functional ability and status . Nutritional status . Physical activity . Advanced directives . List of other physicians . Hospitalizations, surgeries, and ER visits in previous 12 months . Vitals . Screenings to include cognitive, depression, and falls . Referrals and appointments  In addition, I have reviewed and discussed with patient certain preventive protocols, quality metrics, and best practice recommendations. A written personalized care plan for preventive services as well as general  preventive health recommendations were provided to patient.     Varney Biles, LPN   3/61/4431

## 2018-03-27 NOTE — Patient Instructions (Addendum)
  Charles Nichols , Thank you for taking time to come for your Medicare Wellness Visit. I appreciate your ongoing commitment to your health goals. Please review the following plan we discussed and let me know if I can assist you in the future.   Follow up as needed.    Bring a copy of your [place] and/or Living Will to be scanned into chart once completed.   Have a great day!  These are the goals we discussed: Goals    . Maintain Healthy Lifestyle     Continue to monitor blood pressure at home Continue physical therapy as directed Healthy diet Stay hydrated       This is a list of the screening recommended for you and due dates:  Health Maintenance  Topic Date Due  . Tetanus Vaccine  03/12/1970  . Pneumonia vaccines (2 of 2 - PPSV23) 04/12/2018  . Flu Shot  05/08/2018  . Colon Cancer Screening  04/07/2020  .  Hepatitis C: One time screening is recommended by Center for Disease Control  (CDC) for  adults born from [birth year] through 1965.   Completed

## 2018-04-01 DIAGNOSIS — R2681 Unsteadiness on feet: Secondary | ICD-10-CM | POA: Diagnosis not present

## 2018-04-01 DIAGNOSIS — R262 Difficulty in walking, not elsewhere classified: Secondary | ICD-10-CM | POA: Diagnosis not present

## 2018-04-01 DIAGNOSIS — M799 Soft tissue disorder, unspecified: Secondary | ICD-10-CM | POA: Diagnosis not present

## 2018-04-01 DIAGNOSIS — M6281 Muscle weakness (generalized): Secondary | ICD-10-CM | POA: Diagnosis not present

## 2018-04-02 DIAGNOSIS — M799 Soft tissue disorder, unspecified: Secondary | ICD-10-CM | POA: Diagnosis not present

## 2018-04-02 DIAGNOSIS — M6281 Muscle weakness (generalized): Secondary | ICD-10-CM | POA: Diagnosis not present

## 2018-04-02 DIAGNOSIS — R2681 Unsteadiness on feet: Secondary | ICD-10-CM | POA: Diagnosis not present

## 2018-04-02 DIAGNOSIS — R262 Difficulty in walking, not elsewhere classified: Secondary | ICD-10-CM | POA: Diagnosis not present

## 2018-05-02 ENCOUNTER — Ambulatory Visit (INDEPENDENT_AMBULATORY_CARE_PROVIDER_SITE_OTHER): Payer: Medicare Other | Admitting: Internal Medicine

## 2018-05-02 ENCOUNTER — Encounter: Payer: Self-pay | Admitting: Internal Medicine

## 2018-05-02 VITALS — BP 134/78 | HR 86 | Temp 98.4°F | Resp 18 | Wt 308.2 lb

## 2018-05-02 DIAGNOSIS — I10 Essential (primary) hypertension: Secondary | ICD-10-CM

## 2018-05-02 DIAGNOSIS — R739 Hyperglycemia, unspecified: Secondary | ICD-10-CM | POA: Diagnosis not present

## 2018-05-02 DIAGNOSIS — I739 Peripheral vascular disease, unspecified: Secondary | ICD-10-CM

## 2018-05-02 DIAGNOSIS — E7800 Pure hypercholesterolemia, unspecified: Secondary | ICD-10-CM | POA: Diagnosis not present

## 2018-05-02 DIAGNOSIS — M25521 Pain in right elbow: Secondary | ICD-10-CM

## 2018-05-02 DIAGNOSIS — D649 Anemia, unspecified: Secondary | ICD-10-CM

## 2018-05-02 DIAGNOSIS — M21371 Foot drop, right foot: Secondary | ICD-10-CM

## 2018-05-02 NOTE — Progress Notes (Signed)
Patient ID: Charles Nichols, male   DOB: Jun 05, 1951, 67 y.o.   MRN: [id number]   Subjective:    Patient ID: Charles Nichols, male    DOB: Jul 30, 1951, 67 y.o.   MRN: [id number]  HPI  Patient here for a scheduled follow up.  Had been having issues with elevated blood pressure recently.  Medication adjusted.  Started on chlortalidone.  Blood pressure has improved.  No headache.  No dizziness or light headedness.  Has been having problems with right elbow pain.  Has a history of left elbow injury 3-4 years ago.  States will flare at times with intermittent swelling.  No problems now with left elbow.  Noticed first of week, discomfort around right elbow.  Does a lot of work with his hands/arms.  No known injury.  Seeing neurology for f/u gait abnormality with right foot drop.  Has polyneuropathy and left mild CTS.  Recommended continuing B12 and PT.  Reports no chest pain.  No sob.  No acid reflux.  No abdominal pain.  Bowels moving.     Past Medical History:  Diagnosis Date  . Arthritis   . History of chicken pox   . Hypertension   . Obesity   . Peripheral vascular disease ([place])   . Peripheral vascular disease [facility])    Past Surgical History:  Procedure Laterality Date  . CIRCUMCISION    . COLONOSCOPY WITH PROPOFOL N/A 04/08/2015   Procedure: COLONOSCOPY WITH PROPOFOL;  Surgeon: Lollie Sails, MD;  Location: [facility] ENDOSCOPY;  Service: Endoscopy;  Laterality: N/A;  . ESOPHAGOGASTRODUODENOSCOPY N/A 04/08/2015   Procedure: ESOPHAGOGASTRODUODENOSCOPY (EGD);  Surgeon: Lollie Sails, MD;  Location[facility] ENDOSCOPY;  Service: Endoscopy;  Laterality: N/A;  . FEMORAL-POPLITEAL BYPASS GRAFT Bilateral 1997, 2000  . leg stent     pvd   Family History  Problem Relation Age of Onset  . Breast cancer Sister   . Hypertension Sister   . Hypertension Brother   . Breast cancer Daughter    Social History   Socioeconomic History  . Marital status: Legally Separated    Spouse name: Not on file  . Number of  children: Not on file  . Years of education: Not on file  . Highest education level: Not on file  Occupational History  . Not on file  Social Needs  . Financial resource strain: Not on file  . Food insecurity:    Worry: Not on file    Inability: Not on file  . Transportation needs:    Medical: Not on file    Non-medical: Not on file  Tobacco Use  . Smoking status: Former Research scientist (life sciences)  . Smokeless tobacco: Never Used  Substance and Sexual Activity  . Alcohol use: Yes    Alcohol/week: 0.0 oz  . Drug use: No  . Sexual activity: Not on file  Lifestyle  . Physical activity:    Days per week: Not on file    Minutes per session: Not on file  . Stress: Not on file  Relationships  . Social connections:    Talks on phone: Not on file    Gets together: Not on file    Attends religious service: Not on file    Active member of club or organization: Not on file    Attends meetings of clubs or organizations: Not on file    Relationship status: Not on file  Other Topics Concern  . Not on file  Social History Narrative  . Not on file  Outpatient Encounter Medications as of 05/02/2018  Medication Sig  . acetaminophen (TYLENOL) 325 MG tablet Take 650 mg by mouth every 6 (six) hours as needed for pain.  Marland Kitchen aspirin 81 MG tablet Take 81 mg by mouth daily.  . chlorthalidone (HYGROTON) 25 MG tablet Take 1 tablet (25 mg total) by mouth daily.  . Cholecalciferol (VITAMIN D3) 2000 UNITS TABS Take by mouth daily.  . clobetasol cream (TEMOVATE) 6.04 % Apply 1 application topically 2 (two) times daily.  . felodipine (PLENDIL) 10 MG 24 hr tablet Take 1 tablet (10 mg total) daily by mouth.  Marland Kitchen lisinopril (PRINIVIL,ZESTRIL) 20 MG tablet Take 2 tablets (40 mg total) by mouth daily.  Marland Kitchen lovastatin (MEVACOR) 40 MG tablet Take 1 tablet (40 mg total) daily by mouth.  . tadalafil (CIALIS) 10 MG tablet One tablet q 72 hours prn  . vitamin B-12 (CYANOCOBALAMIN) 1000 MCG tablet Take 1,000 mcg by mouth daily.   No  facility-administered encounter medications on file as of 05/02/2018.     Review of Systems  Constitutional: Negative for appetite change and unexpected weight change.  HENT: Negative for congestion and sinus pressure.   Respiratory: Negative for cough, chest tightness and shortness of breath.   Cardiovascular: Negative for chest pain and palpitations.  Gastrointestinal: Negative for abdominal pain, diarrhea, nausea and vomiting.  Genitourinary: Negative for difficulty urinating and dysuria.  Musculoskeletal: Negative for myalgias.       Pain around right elbow as outlined.    Skin: Negative for color change and rash.  Neurological: Negative for dizziness, light-headedness and headaches.  Psychiatric/Behavioral: Negative for agitation and dysphoric mood.       Objective:     Blood pressure rechecked by me:  134/78  Physical Exam  Constitutional: He appears well-developed and well-nourished. No distress.  HENT:  Nose: Nose normal.  Mouth/Throat: Oropharynx is clear and moist.  Neck: Neck supple. No thyromegaly present.  Cardiovascular: Normal rate and regular rhythm.  Pulmonary/Chest: Effort normal and breath sounds normal. No respiratory distress.  Abdominal: Soft. Bowel sounds are normal. There is no tenderness.  Musculoskeletal: He exhibits no edema.  Increased pain to palpation - around elbow.  Increased pain with rotation of forearm - appears to be c/w tendinitis.  No increased erythema.   Lymphadenopathy:    He has no cervical adenopathy.  Skin: No rash noted. No erythema.  Psychiatric: He has a normal mood and affect. His behavior is normal.    BP 134/78   Pulse 86   Temp 98.4 F (36.9 C) (Oral)   Resp 18   Wt (!) 308 lb 3.2 oz (139.8 kg)   SpO2 97%   BMI 40.66 kg/m  Wt Readings from Last 3 Encounters:  05/02/18 (!) 308 lb 3.2 oz (139.8 kg)  03/27/18 (!) 309 lb 12.8 oz (140.5 kg)  02/21/18 298 lb (135.2 kg)     Lab Results  Component Value Date   WBC 8.6  03/27/2018   HGB 12.6 (L) 03/27/2018   HCT 37.9 (L) 03/27/2018   PLT 223.0 03/27/2018   GLUCOSE 98 03/27/2018   CHOL 113 02/21/2018   TRIG 140.0 02/21/2018   HDL 31.30 (L) 02/21/2018   LDLCALC 53 02/21/2018   ALT 20 02/21/2018   AST 20 02/21/2018   NA 137 03/27/2018   K 3.9 03/27/2018   CL 100 03/27/2018   CREATININE 0.95 03/27/2018   BUN 17 03/27/2018   CO2 28 03/27/2018   TSH 3.37 03/27/2018   PSA 1.64  09/03/2017   HGBA1C 5.9 02/21/2018       Assessment & Plan:   Problem List Items Addressed This Visit    Anemia    Follow cbc.       Relevant Orders   CBC with Differential/Platelet   Ferritin   IBC panel   Essential hypertension, benign    Blood pressure on recheck improved.  Doing better.  Continue current medication regimen.  Follow pressures.  Follow metabolic panel.        Relevant Orders   Basic metabolic panel   Hypercholesterolemia    On lovastatin.  Low cholesterol diet and exercise.  Follow lipid panel and liver function tests.        Relevant Orders   Hepatic function panel   Lipid panel   Peripheral vascular disease ([place])    On lovastatin.  Continue risk factor modification.        Right elbow pain    Pain and exam as outlined.  Question of tendinitis.  Elbow strap.  Avoid antiinflammatories.  Tylenol.  Follow.  Call with update.        Right foot drop    Being followed by neurology.  Has gone to PT.  Helped. Follow.         Other Visit Diagnoses    Hyperglycemia    -  Primary   Relevant Orders   Hemoglobin A1c       Einar Pheasant, MD

## 2018-05-05 ENCOUNTER — Encounter: Payer: Self-pay | Admitting: Internal Medicine

## 2018-05-05 ENCOUNTER — Other Ambulatory Visit: Payer: Self-pay | Admitting: Family

## 2018-05-05 ENCOUNTER — Other Ambulatory Visit: Payer: Self-pay | Admitting: Internal Medicine

## 2018-05-05 DIAGNOSIS — I10 Essential (primary) hypertension: Secondary | ICD-10-CM

## 2018-05-05 DIAGNOSIS — M25521 Pain in right elbow: Secondary | ICD-10-CM | POA: Insufficient documentation

## 2018-05-05 NOTE — Assessment & Plan Note (Signed)
Being followed by neurology.  Has gone to PT.  Helped. Follow.

## 2018-05-05 NOTE — Assessment & Plan Note (Signed)
Blood pressure on recheck improved.  Doing better.  Continue current medication regimen.  Follow pressures.  Follow metabolic panel.

## 2018-05-05 NOTE — Assessment & Plan Note (Signed)
Follow cbc.  

## 2018-05-05 NOTE — Assessment & Plan Note (Signed)
On lovastatin.  Continue risk factor modification.  

## 2018-05-05 NOTE — Assessment & Plan Note (Signed)
Pain and exam as outlined.  Question of tendinitis.  Elbow strap.  Avoid antiinflammatories.  Tylenol.  Follow.  Call with update.

## 2018-05-05 NOTE — Assessment & Plan Note (Signed)
On lovastatin.  Low cholesterol diet and exercise.  Follow lipid panel and liver function tests.   

## 2018-08-04 ENCOUNTER — Other Ambulatory Visit (INDEPENDENT_AMBULATORY_CARE_PROVIDER_SITE_OTHER): Payer: Medicare Other

## 2018-08-04 DIAGNOSIS — D649 Anemia, unspecified: Secondary | ICD-10-CM | POA: Diagnosis not present

## 2018-08-04 DIAGNOSIS — E7800 Pure hypercholesterolemia, unspecified: Secondary | ICD-10-CM

## 2018-08-04 DIAGNOSIS — R739 Hyperglycemia, unspecified: Secondary | ICD-10-CM | POA: Diagnosis not present

## 2018-08-04 DIAGNOSIS — I10 Essential (primary) hypertension: Secondary | ICD-10-CM | POA: Diagnosis not present

## 2018-08-04 LAB — BASIC METABOLIC PANEL
BUN: 16 mg/dL (ref 6–23)
CALCIUM: 10 mg/dL (ref 8.4–10.5)
CO2: 29 mEq/L (ref 19–32)
Chloride: 98 mEq/L (ref 96–112)
Creatinine, Ser: 1.03 mg/dL (ref 0.40–1.50)
GFR: 92.53 mL/min (ref >60.00)
Glucose, Bld: 113 mg/dL — ABNORMAL HIGH (ref 70–99)
POTASSIUM: 3.9 meq/L (ref 3.5–5.1)
SODIUM: 136 meq/L (ref 135–145)

## 2018-08-04 LAB — CBC WITH DIFFERENTIAL/PLATELET
BASOS ABS: 0 10*3/uL (ref 0.0–0.1)
Basophils Relative: 0.4 % (ref 0.0–3.0)
EOS ABS: 0.2 10*3/uL (ref 0.0–0.7)
EOS PCT: 2.4 % (ref 0.0–5.0)
HCT: 38.6 % — ABNORMAL LOW (ref 39.0–52.0)
HEMOGLOBIN: 13 g/dL (ref 13.0–17.0)
LYMPHS ABS: 1.7 10*3/uL (ref 0.7–4.0)
LYMPHS PCT: 21 % (ref 12.0–46.0)
MCHC: 33.6 g/dL (ref 30.0–36.0)
MCV: 82.2 fl (ref 78.0–100.0)
MONO ABS: 0.7 10*3/uL (ref 0.1–1.0)
MONOS PCT: 9.1 % (ref 3.0–12.0)
NEUTROS PCT: 67.1 % (ref 43.0–77.0)
Neutro Abs: 5.3 10*3/uL (ref 1.4–7.7)
Platelets: 193 10*3/uL (ref 150.0–400.0)
RBC: 4.7 Mil/uL (ref 4.22–5.81)
RDW: 13.9 % (ref 11.5–15.5)
WBC: 7.9 10*3/uL (ref 4.0–10.5)

## 2018-08-04 LAB — HEPATIC FUNCTION PANEL
ALK PHOS: 58 U/L (ref 39–117)
ALT: 16 U/L (ref 0–53)
AST: 18 U/L (ref 0–37)
Albumin: 4.2 g/dL (ref 3.5–5.2)
Bilirubin, Direct: 0.1 mg/dL (ref 0.0–0.3)
TOTAL PROTEIN: 7.2 g/dL (ref 6.0–8.3)
Total Bilirubin: 0.4 mg/dL (ref 0.2–1.2)

## 2018-08-04 LAB — FERRITIN: Ferritin: 541.5 ng/mL — ABNORMAL HIGH (ref 22.0–322.0)

## 2018-08-04 LAB — LIPID PANEL
Cholesterol: 128 mg/dL (ref 0–200)
HDL: 39 mg/dL — AB (ref >39.00)
LDL Cholesterol: 65 mg/dL (ref 0–99)
NONHDL: 89.16
TRIGLYCERIDES: 123 mg/dL (ref 0.0–149.0)
Total CHOL/HDL Ratio: 3
VLDL: 24.6 mg/dL (ref 0.0–40.0)

## 2018-08-04 LAB — HEMOGLOBIN A1C: Hgb A1c MFr Bld: 5.9 % (ref 4.6–6.5)

## 2018-08-04 LAB — IBC PANEL
Iron: 60 ug/dL (ref 42–165)
SATURATION RATIOS: 16.8 % — AB (ref 20.0–50.0)
Transferrin: 255 mg/dL (ref 212.0–360.0)

## 2018-08-11 ENCOUNTER — Other Ambulatory Visit: Payer: Self-pay | Admitting: Family

## 2018-08-11 DIAGNOSIS — I10 Essential (primary) hypertension: Secondary | ICD-10-CM

## 2018-08-13 DIAGNOSIS — M21371 Foot drop, right foot: Secondary | ICD-10-CM | POA: Diagnosis not present

## 2018-08-13 DIAGNOSIS — G5602 Carpal tunnel syndrome, left upper limb: Secondary | ICD-10-CM | POA: Diagnosis not present

## 2018-08-13 DIAGNOSIS — E538 Deficiency of other specified B group vitamins: Secondary | ICD-10-CM | POA: Diagnosis not present

## 2018-08-13 DIAGNOSIS — R269 Unspecified abnormalities of gait and mobility: Secondary | ICD-10-CM | POA: Diagnosis not present

## 2018-08-13 DIAGNOSIS — G608 Other hereditary and idiopathic neuropathies: Secondary | ICD-10-CM | POA: Diagnosis not present

## 2018-09-02 ENCOUNTER — Encounter: Payer: Self-pay | Admitting: Internal Medicine

## 2018-09-02 ENCOUNTER — Ambulatory Visit (INDEPENDENT_AMBULATORY_CARE_PROVIDER_SITE_OTHER): Payer: Medicare Other | Admitting: Internal Medicine

## 2018-09-02 DIAGNOSIS — I739 Peripheral vascular disease, unspecified: Secondary | ICD-10-CM | POA: Diagnosis not present

## 2018-09-02 DIAGNOSIS — R739 Hyperglycemia, unspecified: Secondary | ICD-10-CM

## 2018-09-02 DIAGNOSIS — D649 Anemia, unspecified: Secondary | ICD-10-CM

## 2018-09-02 DIAGNOSIS — E7800 Pure hypercholesterolemia, unspecified: Secondary | ICD-10-CM

## 2018-09-02 DIAGNOSIS — I10 Essential (primary) hypertension: Secondary | ICD-10-CM | POA: Diagnosis not present

## 2018-09-02 DIAGNOSIS — M21371 Foot drop, right foot: Secondary | ICD-10-CM

## 2018-09-02 DIAGNOSIS — Z23 Encounter for immunization: Secondary | ICD-10-CM

## 2018-09-02 NOTE — Progress Notes (Signed)
Patient ID: Charles Nichols, male   DOB: 11/22/1950, 67 y.o.   MRN: [id number]   Subjective:    Patient ID: Charles Nichols, male    DOB: Sep 27, 1951, 67 y.o.   MRN: [id number]  HPI  Patient here for a scheduled follow up.  Just reevaluated by Dr Manuella Ghazi for f/u gait abnormality due to right foot drop and generalized severe sensorimotor distal polyneuropathy.  Improved with PT.  Doing exercises at home.  Overall feels he has improved.  No chest pain.  No sob.  No acid reflux.  No abdominal pain.  Bowels moving.  No urine change.  Overall feels good.  States his blood pressures have been averaging 130/70s.  Overall feels good.     Past Medical History:  Diagnosis Date  . Arthritis   . History of chicken pox   . Hypertension   . Obesity   . Peripheral vascular disease ([place])   . Peripheral vascular disease [facility])    Past Surgical History:  Procedure Laterality Date  . CIRCUMCISION    . COLONOSCOPY WITH PROPOFOL N/A 04/08/2015   Procedure: COLONOSCOPY WITH PROPOFOL;  Surgeon: Lollie Sails, MD;  [facility]COPY;  Service: Endoscopy;  Laterality: N/A;  . ESOPHAGOGASTRODUODENOSCOPY N/A 04/08/2015   Procedure: ESOPHAGOGASTRODUODENOSCOPY (EGD);  Surgeon: Lollie Sails, MD;  Location: [facility] ENDOSCOPY;  Service: Endoscopy;  Laterality: N/A;  . FEMORAL-POPLITEAL BYPASS GRAFT Bilateral 1997, 2000  . leg stent     pvd   Family History  Problem Relation Age of Onset  . Breast cancer Sister   . Hypertension Sister   . Hypertension Brother   . Breast cancer Daughter    Social History   Socioeconomic History  . Marital status: Legally Separated    Spouse name: Not on file  . Number of children: Not on file  . Years of education: Not on file  . Highest education level: Not on file  Occupational History  . Not on file  Social Needs  . Financial resource strain: Not on file  . Food insecurity:    Worry: Not on file    Inability: Not on file  . Transportation needs:    Medical: Not on  file    Non-medical: Not on file  Tobacco Use  . Smoking status: Former Research scientist (life sciences)  . Smokeless tobacco: Never Used  Substance and Sexual Activity  . Alcohol use: Yes    Alcohol/week: 0.0 standard drinks  . Drug use: No  . Sexual activity: Not on file  Lifestyle  . Physical activity:    Days per week: Not on file    Minutes per session: Not on file  . Stress: Not on file  Relationships  . Social connections:    Talks on phone: Not on file    Gets together: Not on file    Attends religious service: Not on file    Active member of club or organization: Not on file    Attends meetings of clubs or organizations: Not on file    Relationship status: Not on file  Other Topics Concern  . Not on file  Social History Narrative  . Not on file    Outpatient Encounter Medications as of 09/02/2018  Medication Sig  . acetaminophen (TYLENOL) 325 MG tablet Take 650 mg by mouth every 6 (six) hours as needed for pain.  Marland Kitchen aspirin 81 MG tablet Take 81 mg by mouth daily.  . chlorthalidone (HYGROTON) 25 MG tablet TAKE 1 TABLET BY MOUTH ONCE  DAILY  . Cholecalciferol (VITAMIN D3) 2000 UNITS TABS Take by mouth daily.  . clobetasol cream (TEMOVATE) 9.35 % Apply 1 application topically 2 (two) times daily.  . felodipine (PLENDIL) 10 MG 24 hr tablet Take 1 tablet (10 mg total) daily by mouth.  Marland Kitchen lisinopril (PRINIVIL,ZESTRIL) 20 MG tablet TAKE 2 TABLETS BY MOUTH ONCE DAILY  . lovastatin (MEVACOR) 40 MG tablet Take 1 tablet (40 mg total) daily by mouth.  . tadalafil (CIALIS) 10 MG tablet One tablet q 72 hours prn  . vitamin B-12 (CYANOCOBALAMIN) 1000 MCG tablet Take 1,000 mcg by mouth daily.  . [DISCONTINUED] chlorthalidone (HYGROTON) 25 MG tablet TAKE 1 TABLET BY MOUTH ONCE DAILY  . [DISCONTINUED] felodipine (PLENDIL) 10 MG 24 hr tablet TAKE 1 TABLET BY MOUTH ONCE DAILY   No facility-administered encounter medications on file as of 09/02/2018.     Review of Systems  Constitutional: Negative for  appetite change and unexpected weight change.  HENT: Negative for congestion and sinus pressure.   Respiratory: Negative for cough, chest tightness and shortness of breath.   Cardiovascular: Negative for chest pain, palpitations and leg swelling.  Gastrointestinal: Negative for abdominal pain, diarrhea, nausea and vomiting.  Genitourinary: Negative for difficulty urinating and dysuria.  Musculoskeletal: Negative for joint swelling and myalgias.  Skin: Negative for color change and rash.  Neurological: Negative for dizziness, light-headedness and headaches.  Psychiatric/Behavioral: Negative for agitation and dysphoric mood.       Objective:    Physical Exam  Constitutional: He appears well-developed and well-nourished. No distress.  HENT:  Nose: Nose normal.  Mouth/Throat: Oropharynx is clear and moist.  Eyes: Conjunctivae are normal. Right eye exhibits no discharge. Left eye exhibits no discharge.  Neck: Neck supple. No thyromegaly present.  Cardiovascular: Normal rate and regular rhythm.  Pulmonary/Chest: Effort normal and breath sounds normal. No respiratory distress.  Abdominal: Soft. Bowel sounds are normal. There is no tenderness.  Musculoskeletal: He exhibits no edema or tenderness.  Lymphadenopathy:    He has no cervical adenopathy.  Skin: No rash noted. No erythema.  Psychiatric: He has a normal mood and affect. His behavior is normal.    BP 138/72 (BP Location: Left Arm, Patient Position: Sitting, Cuff Size: Large)   Pulse 86   Temp 97.6 F (36.4 C) (Oral)   Resp 18   Wt (!) 311 lb (141.1 kg)   SpO2 98%   BMI 41.03 kg/m  Wt Readings from Last 3 Encounters:  09/02/18 (!) 311 lb (141.1 kg)  05/02/18 (!) 308 lb 3.2 oz (139.8 kg)  03/27/18 (!) 309 lb 12.8 oz (140.5 kg)     Lab Results  Component Value Date   WBC 7.9 08/04/2018   HGB 13.0 08/04/2018   HCT 38.6 (L) 08/04/2018   PLT 193.0 08/04/2018   GLUCOSE 113 (H) 08/04/2018   CHOL 128 08/04/2018   TRIG  123.0 08/04/2018   HDL 39.00 (L) 08/04/2018   LDLCALC 65 08/04/2018   ALT 16 08/04/2018   AST 18 08/04/2018   NA 136 08/04/2018   K 3.9 08/04/2018   CL 98 08/04/2018   CREATININE 1.03 08/04/2018   BUN 16 08/04/2018   CO2 29 08/04/2018   TSH 3.37 03/27/2018   PSA 1.64 09/03/2017   HGBA1C 5.9 08/04/2018       Assessment & Plan:   Problem List Items Addressed This Visit    Anemia    Follow cbc.        Essential hypertension, benign  Blood pressure under good control.  Continue same medication regimen.  Follow pressures.  Follow metabolic panel.        Hypercholesterolemia    On lovastatin.  Low cholesterol diet and exercise.  Follow lipid panel and liver function tests.        Relevant Orders   Hepatic function panel   Lipid panel   Basic metabolic panel   Hyperglycemia    Low carb diet and exercise.  Follow met b and a1c.       Relevant Orders   Hemoglobin A1c   Peripheral vascular disease (H[facility])    On lovastatin.  Continue risk factor modification.        Right foot drop    Being followed by neurology.  Has been evaluated by PT.  Doing home exercises.  Improved.  Follow.         Other Visit Diagnoses    Encounter for immunization       Relevant Orders   Flu vaccine HIGH DOSE PF (Completed)       Einar Pheasant, MD

## 2018-09-06 ENCOUNTER — Encounter: Payer: Self-pay | Admitting: Internal Medicine

## 2018-09-06 DIAGNOSIS — R739 Hyperglycemia, unspecified: Secondary | ICD-10-CM | POA: Insufficient documentation

## 2018-09-06 NOTE — Assessment & Plan Note (Signed)
On lovastatin.  Low cholesterol diet and exercise.  Follow lipid panel and liver function tests.   

## 2018-09-06 NOTE — Assessment & Plan Note (Signed)
Being followed by neurology.  Has been evaluated by PT.  Doing home exercises.  Improved.  Follow.

## 2018-09-06 NOTE — Assessment & Plan Note (Signed)
On lovastatin.  Continue risk factor modification.  

## 2018-09-06 NOTE — Assessment & Plan Note (Signed)
Blood pressure under good control.  Continue same medication regimen.  Follow pressures.  Follow metabolic panel.   

## 2018-09-06 NOTE — Assessment & Plan Note (Signed)
Follow cbc.  

## 2018-09-06 NOTE — Assessment & Plan Note (Signed)
Low carb diet and exercise.  Follow met b and a1c.  

## 2018-11-17 ENCOUNTER — Other Ambulatory Visit: Payer: Self-pay | Admitting: Family

## 2018-11-17 DIAGNOSIS — I10 Essential (primary) hypertension: Secondary | ICD-10-CM

## 2018-12-03 ENCOUNTER — Ambulatory Visit (INDEPENDENT_AMBULATORY_CARE_PROVIDER_SITE_OTHER): Payer: Medicare Other

## 2018-12-03 ENCOUNTER — Other Ambulatory Visit (INDEPENDENT_AMBULATORY_CARE_PROVIDER_SITE_OTHER): Payer: Medicare Other

## 2018-12-03 DIAGNOSIS — R739 Hyperglycemia, unspecified: Secondary | ICD-10-CM | POA: Diagnosis not present

## 2018-12-03 DIAGNOSIS — Z23 Encounter for immunization: Secondary | ICD-10-CM

## 2018-12-03 DIAGNOSIS — E7800 Pure hypercholesterolemia, unspecified: Secondary | ICD-10-CM | POA: Diagnosis not present

## 2018-12-03 LAB — LDL CHOLESTEROL, DIRECT: Direct LDL: 87 mg/dL

## 2018-12-03 LAB — LIPID PANEL
Cholesterol: 141 mg/dL (ref 0–200)
HDL: 42.3 mg/dL (ref >39.00)
NonHDL: 99.13
Total CHOL/HDL Ratio: 3
Triglycerides: 217 mg/dL — ABNORMAL HIGH (ref 0.0–149.0)
VLDL: 43.4 mg/dL — ABNORMAL HIGH (ref 0.0–40.0)

## 2018-12-03 LAB — BASIC METABOLIC PANEL
BUN: 14 mg/dL (ref 6–23)
CALCIUM: 9.9 mg/dL (ref 8.4–10.5)
CO2: 26 mEq/L (ref 19–32)
CREATININE: 0.96 mg/dL (ref 0.40–1.50)
Chloride: 99 mEq/L (ref 96–112)
GFR: 94.33 mL/min (ref >60.00)
GLUCOSE: 105 mg/dL — AB (ref 70–99)
Potassium: 3.8 mEq/L (ref 3.5–5.1)
Sodium: 136 mEq/L (ref 135–145)

## 2018-12-03 LAB — HEPATIC FUNCTION PANEL
ALT: 16 U/L (ref 0–53)
AST: 20 U/L (ref 0–37)
Albumin: 4.2 g/dL (ref 3.5–5.2)
Alkaline Phosphatase: 61 U/L (ref 39–117)
BILIRUBIN DIRECT: 0.1 mg/dL (ref 0.0–0.3)
Total Bilirubin: 0.5 mg/dL (ref 0.2–1.2)
Total Protein: 7.7 g/dL (ref 6.0–8.3)

## 2018-12-03 LAB — HEMOGLOBIN A1C: HEMOGLOBIN A1C: 5.8 % (ref 4.6–6.5)

## 2018-12-03 NOTE — Progress Notes (Signed)
Patent came in today for pneumovax 23 in left deltoid. Patient tolerated well.

## 2019-01-07 ENCOUNTER — Other Ambulatory Visit: Payer: Self-pay | Admitting: Internal Medicine

## 2019-02-11 DIAGNOSIS — G5602 Carpal tunnel syndrome, left upper limb: Secondary | ICD-10-CM | POA: Diagnosis not present

## 2019-02-13 ENCOUNTER — Encounter: Payer: Self-pay | Admitting: Internal Medicine

## 2019-02-13 ENCOUNTER — Other Ambulatory Visit: Payer: Self-pay

## 2019-02-13 ENCOUNTER — Ambulatory Visit (INDEPENDENT_AMBULATORY_CARE_PROVIDER_SITE_OTHER): Payer: Medicare Other | Admitting: Internal Medicine

## 2019-02-13 DIAGNOSIS — D649 Anemia, unspecified: Secondary | ICD-10-CM

## 2019-02-13 DIAGNOSIS — E7800 Pure hypercholesterolemia, unspecified: Secondary | ICD-10-CM

## 2019-02-13 DIAGNOSIS — I10 Essential (primary) hypertension: Secondary | ICD-10-CM

## 2019-02-13 DIAGNOSIS — I739 Peripheral vascular disease, unspecified: Secondary | ICD-10-CM

## 2019-02-13 DIAGNOSIS — Z860100 Personal history of colon polyps, unspecified: Secondary | ICD-10-CM

## 2019-02-13 DIAGNOSIS — R739 Hyperglycemia, unspecified: Secondary | ICD-10-CM

## 2019-02-13 NOTE — Progress Notes (Signed)
Patient ID: Charles Nichols, male   DOB: Jun 29, 1951, 68 y.o.   MRN: [id number]   Virtual Visit via Telephone Note  This visit type was conducted due to national recommendations for restrictions regarding the COVID-19 pandemic (e.g. social distancing).  This format is felt to be most appropriate for this patient at this time.  All issues noted in this document were discussed and addressed.  No physical exam was performed (except for noted visual exam findings with Video Visits).   I connected with Charles Nichols by telephone and verified that I am speaking with the correct person using two identifiers. Location patient: home Location provider: work Persons participating in the telephone visit: patient, provider  I discussed the limitations, risks, security and privacy concerns of performing an evaluation and management service by telephone and the availability of in person appointments. The patient expressed understanding and agreed to proceed.   Reason for visit: scheduled follow up.   HPI: He reports he is doing relatively well.  Doing exercises at home that he learned in therapy.  Feels his balance is better.  Walking better.  Trying to stay active.  No chest pain.  No sob.  No acid reflux.  No abdominal pain.  Bowels moving.  Blood pressures averaging 130-139/70s.  No leg pain.      ROS: See pertinent positives and negatives per HPI.  Past Medical History:  Diagnosis Date  . Arthritis   . History of chicken pox   . Hypertension   . Obesity   . Peripheral vascular disease ([place])   . Peripheral vascular disease [facility])     Past Surgical History:  Procedure Laterality Date  . CIRCUMCISION    . COLONOSCOPY WITH PROPOFOL N/A 04/08/2015   Procedure: COLONOSCOPY WITH PROPOFOL;  Surgeon: Lollie Sails, MD;  Location: [facility] ENDOSCOPY;  Service: Endoscopy;  Laterality: N/A;  . ESOPHAGOGASTRODUODENOSCOPY N/A 04/08/2015   Procedure: ESOPHAGOGASTRODUODENOSCOPY (EGD);  Surgeon: Lollie Sails, MD;   Location: [facility] ENDOSCOPY;  Service: Endoscopy;  Laterality: N/A;  . FEMORAL-POPLITEAL BYPASS GRAFT Bilateral 1997, 2000  . leg stent     pvd    Family History  Problem Relation Age of Onset  . Breast cancer Sister   . Hypertension Sister   . Hypertension Brother   . Breast cancer Daughter     SOCIAL HX: reviewed.    Current Outpatient Medications:  .  acetaminophen (TYLENOL) 325 MG tablet, Take 650 mg by mouth every 6 (six) hours as needed for pain., Disp: , Rfl:  .  aspirin 81 MG tablet, Take 81 mg by mouth daily., Disp: , Rfl:  .  chlorthalidone (HYGROTON) 25 MG tablet, TAKE 1 TABLET BY MOUTH ONCE DAILY, Disp: 90 tablet, Rfl: 0 .  Cholecalciferol (VITAMIN D3) 2000 UNITS TABS, Take by mouth daily., Disp: , Rfl:  .  clobetasol cream (TEMOVATE) 3.41 %, Apply 1 application topically 2 (two) times daily., Disp: 30 g, Rfl: 0 .  felodipine (PLENDIL) 10 MG 24 hr tablet, Take 1 tablet (10 mg total) daily by mouth., Disp: 90 tablet, Rfl: 1 .  lisinopril (PRINIVIL,ZESTRIL) 20 MG tablet, TAKE 2 TABLETS BY MOUTH ONCE DAILY, Disp: 180 tablet, Rfl: 3 .  lovastatin (MEVACOR) 40 MG tablet, Take 1 tablet by mouth once daily, Disp: 90 tablet, Rfl: 0 .  tadalafil (CIALIS) 10 MG tablet, One tablet q 72 hours prn, Disp: 10 tablet, Rfl: 0 .  vitamin B-12 (CYANOCOBALAMIN) 1000 MCG tablet, Take 1,000 mcg by mouth daily., Disp: , Rfl:  EXAM:  GENERAL: alert, oriented, appears well and in no acute distress  HEENT: atraumatic, conjunttiva clear, no obvious abnormalities on inspection of external nose and ears  NECK: normal movements of the head and neck  LUNGS: on inspection no signs of respiratory distress, breathing rate appears normal, no obvious gross SOB, gasping or wheezing  CV: no obvious cyanosis  PSYCH/NEURO: pleasant and cooperative, no obvious depression or anxiety, speech and thought processing grossly intact  ASSESSMENT AND PLAN:  Discussed the following assessment and plan:   Anemia, unspecified type  Essential hypertension, benign - Plan: Basic metabolic panel  History of colonic polyps  Hypercholesterolemia - Plan: Hepatic function panel, Lipid panel  Hyperglycemia - Plan: Hemoglobin A1c  Peripheral vascular disease ([place])  Anemia Hgb 08/04/18 - wnl.  Follow.    Essential hypertension, benign Blood pressure as outlined.  Continue current medication.  Follow pressure.  Follow metabolic panel.    History of colonic polyps Colonoscopy 04/2015.  Due f/u in 5 years.    Hypercholesterolemia On lovastatin.  Low cholesterol diet and exercise.  Follow lipid panel and liver function tests.    Hyperglycemia Low carb diet and exercise.  Follow met b and a1c.   Peripheral vascular disease On lovastatin.  Continue risk factor modification.     I discussed the assessment and treatment plan with the patient. The patient was provided an opportunity to ask questions and all were answered. The patient agreed with the plan and demonstrated an understanding of the instructions.   The patient was advised to call back or seek an in-person evaluation if the symptoms worsen or if the condition fails to improve as anticipated.  I provided 15 minutes of non-face-to-face time during this encounter.   Einar Pheasant, MD

## 2019-02-16 ENCOUNTER — Encounter: Payer: Self-pay | Admitting: Internal Medicine

## 2019-02-16 NOTE — Assessment & Plan Note (Signed)
On lovastatin.  Low cholesterol diet and exercise.  Follow lipid panel and liver function tests.   

## 2019-02-16 NOTE — Assessment & Plan Note (Signed)
Colonoscopy 04/2015.  Due f/u in 5 years.

## 2019-02-16 NOTE — Assessment & Plan Note (Signed)
Low carb diet and exercise.  Follow met b and a1c.  

## 2019-02-16 NOTE — Assessment & Plan Note (Signed)
Hgb 08/04/18 - wnl.  Follow.

## 2019-02-16 NOTE — Assessment & Plan Note (Signed)
On lovastatin.  Continue risk factor modification.

## 2019-02-16 NOTE — Assessment & Plan Note (Signed)
Blood pressure as outlined.  Continue current medication.  Follow pressure.  Follow metabolic panel.

## 2019-02-17 ENCOUNTER — Other Ambulatory Visit: Payer: Self-pay | Admitting: Family

## 2019-02-17 ENCOUNTER — Other Ambulatory Visit: Payer: Self-pay | Admitting: Internal Medicine

## 2019-02-17 DIAGNOSIS — I10 Essential (primary) hypertension: Secondary | ICD-10-CM

## 2019-02-20 NOTE — Telephone Encounter (Signed)
error 

## 2019-03-30 ENCOUNTER — Ambulatory Visit: Payer: Medicare Other | Admitting: Internal Medicine

## 2019-03-30 ENCOUNTER — Encounter: Payer: Self-pay | Admitting: Internal Medicine

## 2019-03-30 ENCOUNTER — Ambulatory Visit (INDEPENDENT_AMBULATORY_CARE_PROVIDER_SITE_OTHER): Payer: Medicare Other

## 2019-03-30 ENCOUNTER — Ambulatory Visit (INDEPENDENT_AMBULATORY_CARE_PROVIDER_SITE_OTHER): Payer: Medicare Other | Admitting: Internal Medicine

## 2019-03-30 ENCOUNTER — Other Ambulatory Visit: Payer: Self-pay

## 2019-03-30 DIAGNOSIS — M21371 Foot drop, right foot: Secondary | ICD-10-CM

## 2019-03-30 DIAGNOSIS — E7800 Pure hypercholesterolemia, unspecified: Secondary | ICD-10-CM

## 2019-03-30 DIAGNOSIS — Z0000 Encounter for general adult medical examination without abnormal findings: Secondary | ICD-10-CM

## 2019-03-30 DIAGNOSIS — D649 Anemia, unspecified: Secondary | ICD-10-CM | POA: Diagnosis not present

## 2019-03-30 DIAGNOSIS — R739 Hyperglycemia, unspecified: Secondary | ICD-10-CM

## 2019-03-30 DIAGNOSIS — I10 Essential (primary) hypertension: Secondary | ICD-10-CM

## 2019-03-30 DIAGNOSIS — I739 Peripheral vascular disease, unspecified: Secondary | ICD-10-CM

## 2019-03-30 NOTE — Patient Instructions (Addendum)
  Charles Nichols , Thank you for taking time to come for your Medicare Wellness Visit. I appreciate your ongoing commitment to your health goals. Please review the following plan we discussed and let me know if I can assist you in the future.   These are the goals we discussed: Goals      Patient Stated   . Follow up with Primary Care Provider (pt-stated)     As needed Lose a little weight Low cholesterol diet Stay active       This is a list of the screening recommended for you and due dates:  Health Maintenance  Topic Date Due  . Flu Shot  05/09/2019  . Colon Cancer Screening  04/07/2020  . Tetanus Vaccine  04/02/2028  .  Hepatitis C: One time screening is recommended by Center for Disease Control  (CDC) for  adults born from [birth year] through 1965.   Completed  . Pneumonia vaccines  Completed

## 2019-03-30 NOTE — Progress Notes (Signed)
Patient ID: Charles Nichols, male   DOB: Jan 02, 1951, 68 y.o.   MRN: [id number]   Virtual Visit via telephone Note  This visit type was conducted due to national recommendations for restrictions regarding the COVID-19 pandemic (e.g. social distancing).  This format is felt to be most appropriate for this patient at this time.  All issues noted in this document were discussed and addressed.  No physical exam was performed (except for noted visual exam findings with Video Visits).   I connected with Charles Nichols by telephone and verified that I am speaking with the correct person using two identifiers. Location patient: home Location provider: work  Persons participating in the telephone visit: patient, provider  I discussed the limitations, risks, security and privacy concerns of performing an evaluation and management service by telephone and the availability of in person appointments.   The patient expressed understanding and agreed to proceed.   Reason for visit: scheduled follow up.   HPI: He reports he is doing well.  Feels good.  Trying to stay in due to covid restrictions.  No known covid exposures.  No chest pain or sob.  No cough or chest congestion.  No fever.  No acid reflux.  No abdominal pain.  Bowels moving.  States blood pressure on initial check highest reading 142/79.  Will rest just a few minutes and will then average 130/70.  Taking his medication regularly.  Trying to watch his diet.  States weight is 305 pounds.  We discussed low carb diet and exercise.  He is doing home exercises and stretches.  Balance/gait - improved.  Walking better.     ROS: See pertinent positives and negatives per HPI.  Past Medical History:  Diagnosis Date  . Arthritis   . History of chicken pox   . Hypertension   . Obesity   . Peripheral vascular disease ([place])   . Peripheral vascular disease [facility])     Past Surgical History:  Procedure Laterality Date  . CIRCUMCISION    . COLONOSCOPY WITH  PROPOFOL N/A 04/08/2015   Procedure: COLONOSCOPY WITH PROPOFOL;  Surgeon: Lollie Sails, MD;  Location[facility] ENDOSCOPY;  Service: Endoscopy;  Laterality: N/A;  . ESOPHAGOGASTRODUODENOSCOPY N/A 04/08/2015   Procedure: ESOPHAGOGASTRODUODENOSCOPY (EGD);  Surgeon: Lollie Sails, MD;  Location: [facility] ENDOSCOPY;  Service: Endoscopy;  Laterality: N/A;  . FEMORAL-POPLITEAL BYPASS GRAFT Bilateral 1997, 2000  . leg stent     pvd    Family History  Problem Relation Age of Onset  . Breast cancer Sister   . Hypertension Sister   . Hypertension Brother   . Breast cancer Daughter     SOCIAL HX: reviewed.    Current Outpatient Medications:  .  acetaminophen (TYLENOL) 325 MG tablet, Take 650 mg by mouth every 6 (six) hours as needed for pain., Disp: , Rfl:  .  aspirin 81 MG tablet, Take 81 mg by mouth daily., Disp: , Rfl:  .  chlorthalidone (HYGROTON) 25 MG tablet, Take 1 tablet by mouth once daily, Disp: 90 tablet, Rfl: 0 .  Cholecalciferol (VITAMIN D3) 2000 UNITS TABS, Take by mouth daily., Disp: , Rfl:  .  clobetasol cream (TEMOVATE) 9.92 %, Apply 1 application topically 2 (two) times daily., Disp: 30 g, Rfl: 0 .  felodipine (PLENDIL) 10 MG 24 hr tablet, Take 1 tablet by mouth once daily, Disp: 90 tablet, Rfl: 1 .  lisinopril (PRINIVIL,ZESTRIL) 20 MG tablet, TAKE 2 TABLETS BY MOUTH ONCE DAILY, Disp: 180 tablet, Rfl: 3 .  lovastatin (MEVACOR) 40 MG tablet, Take 1 tablet by mouth once daily, Disp: 90 tablet, Rfl: 0 .  tadalafil (CIALIS) 10 MG tablet, One tablet q 72 hours prn, Disp: 10 tablet, Rfl: 0 .  vitamin B-12 (CYANOCOBALAMIN) 1000 MCG tablet, Take 1,000 mcg by mouth daily., Disp: , Rfl:   EXAM:  VITALS per patient if applicable:  Averaging (after rest) - 130/70.  Weight 305 pounds.    GENERAL: alert.  Sounds to be in no acute distress.  Answering questions appropriately.    PSYCH/NEURO: pleasant and cooperative, no obvious depression or anxiety, speech and thought processing grossly  intact  ASSESSMENT AND PLAN:  Discussed the following assessment and plan:  Right foot drop Being followed by neurology.  Doing stretches and exercises at home.  Gait/balance improved.  Walking better.  Follow.    Peripheral vascular disease On lovastatin.  Continue risk factor modification.  Doing well.    Hyperglycemia Discussed low carb diet and exercise.  Follow met b and a1c.    Anemia Follow cbc.    Essential hypertension, benign Blood pressure as outlined.  Continue current medication regimen.  Follow pressures.  Follow metabolic panel.    Hypercholesterolemia On lovastatin.  Low cholesterol diet and exercise.  Follow lipid panel and liver function tests.      I discussed the assessment and treatment plan with the patient. The patient was provided an opportunity to ask questions and all were answered. The patient agreed with the plan and demonstrated an understanding of the instructions.   The patient was advised to call back or seek an in-person evaluation if the symptoms worsen or if the condition fails to improve as anticipated.  I provided 13 minutes of non-face-to-face time during this encounter.   Einar Pheasant, MD

## 2019-03-30 NOTE — Progress Notes (Signed)
Subjective:   Jairus Tonne Zingale is a 68 y.o. male who presents for Medicare Annual/Subsequent preventive examination.  Review of Systems:  No ROS.  Medicare Wellness Virtual Visit.  Visual/audio telehealth visit, UTA vital signs.   See social history for additional risk factors.   Cardiac Risk Factors include: advanced age (>86men, >80 women);male gender;hypertension     Objective:    Vitals: There were no vitals taken for this visit.  There is no height or weight on file to calculate BMI.  Advanced Directives 03/30/2019 03/27/2018 03/16/2016 04/08/2015  Does Patient Have a Medical Advance Directive? No No No No  Would patient like information on creating a medical advance directive? Yes (MAU/Ambulatory/Procedural Areas - Information given) Yes (MAU/Ambulatory/Procedural Areas - Information given) Yes - Educational materials given -    Tobacco Social History   Tobacco Use  Smoking Status Former Smoker  Smokeless Tobacco Never Used     Counseling given: Not Answered   Clinical Intake:  Pre-visit preparation completed: Yes        Diabetes: No  How often do you need to have someone help you when you read instructions, pamphlets, or other written materials from your doctor or pharmacy?: 1 - Never  Interpreter Needed?: No     Past Medical History:  Diagnosis Date  . Arthritis   . History of chicken pox   . Hypertension   . Obesity   . Peripheral vascular disease ([place])   . Peripheral vascular disease [facility])    Past Surgical History:  Procedure Laterality Date  . CIRCUMCISION    . COLONOSCOPY WITH PROPOFOL N/A 04/08/2015   Procedure: COLONOSCOPY WITH PROPOFOL;  Surgeon: Lollie Sails, MD;  Location: [facility] ENDOSCOPY;  Service: Endoscopy;  Laterality: N/A;  . ESOPHAGOGASTRODUODENOSCOPY N/A 04/08/2015   Procedure: ESOPHAGOGASTRODUODENOSCOPY (EGD);  Surgeon: Lollie Sails, MD;  Location: [facility] ENDOSCOPY;  Service: Endoscopy;  Laterality: N/A;  . FEMORAL-POPLITEAL BYPASS  GRAFT Bilateral 1997, 2000  . leg stent     pvd   Family History  Problem Relation Age of Onset  . Breast cancer Sister   . Hypertension Sister   . Hypertension Brother   . Breast cancer Daughter    Social History   Socioeconomic History  . Marital status: Legally Separated    Spouse name: Not on file  . Number of children: Not on file  . Years of education: Not on file  . Highest education level: Not on file  Occupational History  . Not on file  Social Needs  . Financial resource strain: Not hard at all  . Food insecurity    Worry: Never true    Inability: Never true  . Transportation needs    Medical: No    Non-medical: No  Tobacco Use  . Smoking status: Former Research scientist (life sciences)  . Smokeless tobacco: Never Used  Substance and Sexual Activity  . Alcohol use: Yes    Alcohol/week: 0.0 standard drinks  . Drug use: No  . Sexual activity: Not on file  Lifestyle  . Physical activity    Days per week: 3 days    Minutes per session: 20 min  . Stress: Only a little  Relationships  . Social Herbalist on phone: Not on file    Gets together: Not on file    Attends religious service: Not on file    Active member of club or organization: Not on file    Attends meetings of clubs or organizations: Not on file  Relationship status: Not on file  Other Topics Concern  . Not on file  Social History Narrative  . Not on file    Outpatient Encounter Medications as of 03/30/2019  Medication Sig  . acetaminophen (TYLENOL) 325 MG tablet Take 650 mg by mouth every 6 (six) hours as needed for pain.  Marland Kitchen aspirin 81 MG tablet Take 81 mg by mouth daily.  . chlorthalidone (HYGROTON) 25 MG tablet Take 1 tablet by mouth once daily  . Cholecalciferol (VITAMIN D3) 2000 UNITS TABS Take by mouth daily.  . clobetasol cream (TEMOVATE) 8.65 % Apply 1 application topically 2 (two) times daily.  . felodipine (PLENDIL) 10 MG 24 hr tablet Take 1 tablet by mouth once daily  . lisinopril  (PRINIVIL,ZESTRIL) 20 MG tablet TAKE 2 TABLETS BY MOUTH ONCE DAILY  . lovastatin (MEVACOR) 40 MG tablet Take 1 tablet by mouth once daily  . tadalafil (CIALIS) 10 MG tablet One tablet q 72 hours prn  . vitamin B-12 (CYANOCOBALAMIN) 1000 MCG tablet Take 1,000 mcg by mouth daily.   No facility-administered encounter medications on file as of 03/30/2019.     Activities of Daily Living In your present state of health, do you have any difficulty performing the following activities: 03/30/2019  Hearing? N  Vision? N  Difficulty concentrating or making decisions? N  Walking or climbing stairs? Y  Comment Ambulates with cane as needed when walking. Unsteady gait.  Dressing or bathing? N  Doing errands, shopping? N  Preparing Food and eating ? N  Using the Toilet? N  In the past six months, have you accidently leaked urine? N  Do you have problems with loss of bowel control? N  Managing your Medications? N  Managing your Finances? N  Housekeeping or managing your Housekeeping? N  Some recent data might be hidden    Patient Care Team: Einar Pheasant, MD as PCP - General (Internal Medicine)   Assessment:   This is a routine wellness examination for [place].  I connected with patient 03/30/19 at  2:00 PM EDT by an audio enabled telemedicine application and verified that I am speaking with the correct person using two identifiers. Patient stated full name and DOB. Patient gave permission to continue with virtual visit. Patient's location was at home and Nurse's location was at [place] office.   Health Screenings  Colonoscopy - 04/2015 Glaucoma -none Hearing -demonstrates normal hearing during visit. Hemoglobin A1C - 11/2018 Cholesterol - 11/2018 Dental- UTD. Upper dentures.  Vision- He plans to schedule  Social   Alcohol intake - yes      Smoking history- former    Smokers in home? none Illicit drug use? none Exercise - active around the home; home physical therapy exercises Diet -  regular  Sexually Active -not currently BMI- discussed the importance of a healthy diet, water intake and the benefits of aerobic exercise.  Educational material provided.   Safety  Patient feels safe at home- yes Patient does have smoke detectors at home- yes Patient does wear sunscreen or protective clothing when in direct sunlight -yes Patient does wear seat belt when in a moving vehicle -yes  Covid-19 precautions and sickness symptoms discussed.   Activities of Daily Living Patient denies needing assistance with: driving, household chores, feeding themselves, getting from bed to chair, getting to the toilet, bathing/showering, dressing, managing money, or preparing meals.  No new identified risk were noted.   Paces himself with activity.   Depression Screen Patient denies losing interest in daily  life, feeling hopeless, or crying easily over simple problems.   Medication-taking as directed and without issues.   Fall Screen Patient denies being afraid of falling or falling in the last year.   Memory Screen Patient is alert.  Patient denies difficulty focusing, concentrating or misplacing items. Correctly identified the president of the [place], season and recall. Patient likes to read for brain stimulation.  Immunizations The following Immunizations were discussed: Influenza, shingles, pneumonia, and tetanus.   Other Providers Patient Care Team: Einar Pheasant, MD as PCP - General (Internal Medicine)  Exercise Activities and Dietary recommendations Current Exercise Habits: Home exercise routine, Time (Minutes): 20, Frequency (Times/Week): 3, Weekly Exercise (Minutes/Week): 60, Intensity: Mild  Goals      Patient Stated   . Follow up with Primary Care Provider (pt-stated)     As needed Lose a little weight Low cholesterol diet Stay active       Fall Risk Fall Risk  03/30/2019 03/27/2018 08/22/2017 08/22/2016 11/19/2014  Falls in the past year? 0 No No No No    Depression Screen PHQ 2/9 Scores 03/30/2019 03/27/2018 08/22/2017 08/22/2016  PHQ - 2 Score 0 0 0 0    Cognitive Function MMSE - Mini Mental State Exam 03/27/2018  Orientation to time 5  Orientation to Place 5  Registration 3  Attention/ Calculation 5  Recall 3  Language- name 2 objects 2  Language- repeat 1  Language- follow 3 step command 3  Language- read & follow direction 1  Write a sentence 1  Copy design 1  Total score 30     6CIT Screen 03/30/2019  What Year? 0 points  What month? 0 points  What time? 0 points  Count back from 20 0 points  Months in reverse 0 points  Repeat phrase 0 points  Total Score 0    Immunization History  Administered Date(s) Administered  . Influenza Split 06/29/2013, 09/21/2014  . Influenza, High Dose Seasonal PF 08/22/2016, 08/22/2017, 09/02/2018  . Influenza-Unspecified 07/03/2015  . Pneumococcal Conjugate-13 04/12/2017  . Pneumococcal Polysaccharide-23 12/03/2018  . Tdap 04/02/2018  . Zoster 11/02/2014   Screening Tests Health Maintenance  Topic Date Due  . INFLUENZA VACCINE  05/09/2019  . COLONOSCOPY  04/07/2020  . TETANUS/TDAP  04/02/2028  . Hepatitis C Screening  Completed  . PNA vac Low Risk Adult  Completed       Plan:    End of life planning; Advance aging; Advanced directives discussed.  Copy of current HCPOA/Living Will requested upon completion.    I have personally reviewed and noted the following in the patient's chart:   . Medical and social history . Use of alcohol, tobacco or illicit drugs  . Current medications and supplements . Functional ability and status . Nutritional status . Physical activity . Advanced directives . List of other physicians . Hospitalizations, surgeries, and ER visits in previous 12 months . Vitals . Screenings to include cognitive, depression, and falls . Referrals and appointments  In addition, I have reviewed and discussed with patient certain preventive protocols, quality  metrics, and best practice recommendations. A written personalized care plan for preventive services as well as general preventive health recommendations were provided to patient.     Varney Biles, LPN  [id number]/7673   Reviewed above information.  Agree with assessment and plan.    Dr Nicki Reaper

## 2019-03-31 ENCOUNTER — Encounter: Payer: Self-pay | Admitting: Internal Medicine

## 2019-03-31 NOTE — Assessment & Plan Note (Signed)
On lovastatin.  Low cholesterol diet and exercise.  Follow lipid panel and liver function tests.   

## 2019-03-31 NOTE — Assessment & Plan Note (Signed)
Blood pressure as outlined.  Continue current medication regimen.  Follow pressures.  Follow metabolic panel.  

## 2019-03-31 NOTE — Assessment & Plan Note (Signed)
Discussed low carb diet and exercise.  Follow met b and a1c.   

## 2019-03-31 NOTE — Assessment & Plan Note (Signed)
On lovastatin.  Continue risk factor modification.  Doing well.

## 2019-03-31 NOTE — Assessment & Plan Note (Signed)
Being followed by neurology.  Doing stretches and exercises at home.  Gait/balance improved.  Walking better.  Follow.

## 2019-03-31 NOTE — Assessment & Plan Note (Signed)
Follow cbc.  

## 2019-04-21 ENCOUNTER — Other Ambulatory Visit: Payer: Self-pay | Admitting: Internal Medicine

## 2019-05-05 ENCOUNTER — Telehealth: Payer: Self-pay

## 2019-05-05 NOTE — Telephone Encounter (Signed)
Copied from [place] [id number]. Topic: General - Other >> May 05, 2019  1:28 PM Keene Breath wrote: Reason for CRM: Patient called to ask the nurse to call him regarding his Lab work appt. On 06/03/19.  Patient has moved to [place] and would like the test done in [place] if possible.  Please advise and call patient back to let him know.  CB# [phone]

## 2019-05-05 NOTE — Telephone Encounter (Signed)
Pt has always lived in [place]. He is going to come here to have his labs done. I am unsure of where he can go to have them drawn in [place]. He stated he is ok coming here and will inquire about having them done else where at a later date

## 2019-05-25 DIAGNOSIS — G5602 Carpal tunnel syndrome, left upper limb: Secondary | ICD-10-CM | POA: Diagnosis not present

## 2019-05-25 DIAGNOSIS — M21371 Foot drop, right foot: Secondary | ICD-10-CM | POA: Diagnosis not present

## 2019-05-25 DIAGNOSIS — G608 Other hereditary and idiopathic neuropathies: Secondary | ICD-10-CM | POA: Diagnosis not present

## 2019-05-25 DIAGNOSIS — R269 Unspecified abnormalities of gait and mobility: Secondary | ICD-10-CM | POA: Diagnosis not present

## 2019-06-03 ENCOUNTER — Other Ambulatory Visit (INDEPENDENT_AMBULATORY_CARE_PROVIDER_SITE_OTHER): Payer: Medicare Other

## 2019-06-03 ENCOUNTER — Other Ambulatory Visit: Payer: Self-pay

## 2019-06-03 DIAGNOSIS — R739 Hyperglycemia, unspecified: Secondary | ICD-10-CM

## 2019-06-03 DIAGNOSIS — I10 Essential (primary) hypertension: Secondary | ICD-10-CM | POA: Diagnosis not present

## 2019-06-03 LAB — TSH: TSH: 5.71 u[IU]/mL — ABNORMAL HIGH (ref 0.35–4.50)

## 2019-06-04 ENCOUNTER — Other Ambulatory Visit (INDEPENDENT_AMBULATORY_CARE_PROVIDER_SITE_OTHER): Payer: Medicare Other

## 2019-06-04 DIAGNOSIS — I10 Essential (primary) hypertension: Secondary | ICD-10-CM

## 2019-06-04 DIAGNOSIS — E7800 Pure hypercholesterolemia, unspecified: Secondary | ICD-10-CM

## 2019-06-04 DIAGNOSIS — R739 Hyperglycemia, unspecified: Secondary | ICD-10-CM | POA: Diagnosis not present

## 2019-06-04 LAB — HEPATIC FUNCTION PANEL
ALT: 15 U/L (ref 0–53)
AST: 21 U/L (ref 0–37)
Albumin: 4.1 g/dL (ref 3.5–5.2)
Alkaline Phosphatase: 55 U/L (ref 39–117)
Bilirubin, Direct: 0.1 mg/dL (ref 0.0–0.3)
Total Bilirubin: 0.4 mg/dL (ref 0.2–1.2)
Total Protein: 7.5 g/dL (ref 6.0–8.3)

## 2019-06-04 LAB — BASIC METABOLIC PANEL
BUN: 17 mg/dL (ref 6–23)
CO2: 26 mEq/L (ref 19–32)
Calcium: 9.7 mg/dL (ref 8.4–10.5)
Chloride: 100 mEq/L (ref 96–112)
Creatinine, Ser: 0.94 mg/dL (ref 0.40–1.50)
GFR: 96.5 mL/min (ref >60.00)
Glucose, Bld: 94 mg/dL (ref 70–99)
Potassium: 3.8 mEq/L (ref 3.5–5.1)
Sodium: 136 mEq/L (ref 135–145)

## 2019-06-04 LAB — LIPID PANEL
Cholesterol: 127 mg/dL (ref 0–200)
HDL: 37.7 mg/dL — ABNORMAL LOW (ref >39.00)
LDL Cholesterol: 59 mg/dL (ref 0–99)
NonHDL: 89.4
Total CHOL/HDL Ratio: 3
Triglycerides: 154 mg/dL — ABNORMAL HIGH (ref 0.0–149.0)
VLDL: 30.8 mg/dL (ref 0.0–40.0)

## 2019-06-04 NOTE — Addendum Note (Signed)
Addended by: Leeanne Rio on: 06/04/2019 03:20 PM   Modules accepted: Orders

## 2019-06-04 NOTE — Addendum Note (Signed)
Addended by: Leeanne Rio on: 06/04/2019 03:05 PM   Modules accepted: Orders

## 2019-06-04 NOTE — Addendum Note (Signed)
Addended by: Leeanne Rio on: 06/04/2019 03:06 PM   Modules accepted: Orders

## 2019-06-05 ENCOUNTER — Other Ambulatory Visit: Payer: Self-pay | Admitting: Internal Medicine

## 2019-06-05 DIAGNOSIS — R7989 Other specified abnormal findings of blood chemistry: Secondary | ICD-10-CM

## 2019-06-05 LAB — HEMOGLOBIN A1C
Hgb A1c MFr Bld: 5.9 % of total Hgb — ABNORMAL HIGH (ref <5.7)
Mean Plasma Glucose: 123 (calc)
eAG (mmol/L): 6.8 (calc)

## 2019-06-05 NOTE — Progress Notes (Signed)
Order placed for f/u tsh.  

## 2019-07-28 DIAGNOSIS — Z0120 Encounter for dental examination and cleaning without abnormal findings: Secondary | ICD-10-CM | POA: Diagnosis not present

## 2019-08-03 ENCOUNTER — Ambulatory Visit (INDEPENDENT_AMBULATORY_CARE_PROVIDER_SITE_OTHER): Payer: Medicare Other | Admitting: Internal Medicine

## 2019-08-03 ENCOUNTER — Other Ambulatory Visit: Payer: Self-pay

## 2019-08-03 VITALS — BP 132/78 | HR 87 | Temp 97.5°F | Resp 16 | Ht 74.0 in | Wt 304.0 lb

## 2019-08-03 DIAGNOSIS — I10 Essential (primary) hypertension: Secondary | ICD-10-CM | POA: Diagnosis not present

## 2019-08-03 DIAGNOSIS — R739 Hyperglycemia, unspecified: Secondary | ICD-10-CM

## 2019-08-03 DIAGNOSIS — R635 Abnormal weight gain: Secondary | ICD-10-CM

## 2019-08-03 DIAGNOSIS — E7800 Pure hypercholesterolemia, unspecified: Secondary | ICD-10-CM

## 2019-08-03 DIAGNOSIS — Z23 Encounter for immunization: Secondary | ICD-10-CM | POA: Diagnosis not present

## 2019-08-03 DIAGNOSIS — Z0000 Encounter for general adult medical examination without abnormal findings: Secondary | ICD-10-CM

## 2019-08-03 DIAGNOSIS — D649 Anemia, unspecified: Secondary | ICD-10-CM | POA: Diagnosis not present

## 2019-08-03 DIAGNOSIS — I739 Peripheral vascular disease, unspecified: Secondary | ICD-10-CM

## 2019-08-03 DIAGNOSIS — M21371 Foot drop, right foot: Secondary | ICD-10-CM

## 2019-08-03 LAB — CBC WITH DIFFERENTIAL/PLATELET
Basophils Absolute: 0 10*3/uL (ref 0.0–0.1)
Basophils Relative: 0.5 % (ref 0.0–3.0)
Eosinophils Absolute: 0.3 10*3/uL (ref 0.0–0.7)
Eosinophils Relative: 5.6 % — ABNORMAL HIGH (ref 0.0–5.0)
HCT: 39 % (ref 39.0–52.0)
Hemoglobin: 12.9 g/dL — ABNORMAL LOW (ref 13.0–17.0)
Lymphocytes Relative: 26.1 % (ref 12.0–46.0)
Lymphs Abs: 1.5 10*3/uL (ref 0.7–4.0)
MCHC: 33 g/dL (ref 30.0–36.0)
MCV: 82.5 fl (ref 78.0–100.0)
Monocytes Absolute: 0.7 10*3/uL (ref 0.1–1.0)
Monocytes Relative: 11.7 % (ref 3.0–12.0)
Neutro Abs: 3.3 10*3/uL (ref 1.4–7.7)
Neutrophils Relative %: 56.1 % (ref 43.0–77.0)
Platelets: 196 10*3/uL (ref 150.0–400.0)
RBC: 4.73 Mil/uL (ref 4.22–5.81)
RDW: 13.9 % (ref 11.5–15.5)
WBC: 5.9 10*3/uL (ref 4.0–10.5)

## 2019-08-03 LAB — BASIC METABOLIC PANEL
BUN: 14 mg/dL (ref 6–23)
CO2: 27 mEq/L (ref 19–32)
Calcium: 9.8 mg/dL (ref 8.4–10.5)
Chloride: 99 mEq/L (ref 96–112)
Creatinine, Ser: 0.98 mg/dL (ref 0.40–1.50)
GFR: 91.93 mL/min (ref >60.00)
Glucose, Bld: 98 mg/dL (ref 70–99)
Potassium: 3.9 mEq/L (ref 3.5–5.1)
Sodium: 135 mEq/L (ref 135–145)

## 2019-08-03 LAB — FERRITIN: Ferritin: 690.5 ng/mL — ABNORMAL HIGH (ref 22.0–322.0)

## 2019-08-03 LAB — VITAMIN B12: Vitamin B-12: 831 pg/mL (ref 211–911)

## 2019-08-03 LAB — TSH: TSH: 6.07 u[IU]/mL — ABNORMAL HIGH (ref 0.35–4.50)

## 2019-08-03 NOTE — Progress Notes (Signed)
Patient ID: Charles Nichols, male   DOB: November 01, 1950, 68 y.o.   MRN: [id number]   Subjective:    Patient ID: Charles Nichols, male    DOB: [birth year], 68 y.o.   MRN: [id number]  HPI  Patient here for his physical exam.  States he is doing relatively well.  Trying to stay active.  He does have to stop and rest - he relates to his legs.  No chest pain.  No sob.  No acid reflux.  No abdominal pain.  Bowels stable.  Trying to watch his diet. Has lost some weight.  Has gait abnormality due to right foot drop and generalized severe sensorimotor distal predominant polyneuropathy.  This is fairly stable. Followed by neurology.  Does have some changes in the tissue - lower extremity - appears to be c/w some atrophy changes.  Has noticed need to stop and rest as outlined.  Discussed vascular w/up. Pt in agreement.  He reports blood pressure has been doing well.     Past Medical History:  Diagnosis Date  . Arthritis   . History of chicken pox   . Hypertension   . Obesity   . Peripheral vascular disease ([place])   . Peripheral vascular disease [facility])    Past Surgical History:  Procedure Laterality Date  . CIRCUMCISION    . COLONOSCOPY WITH PROPOFOL N/A 04/08/2015   Procedure: COLONOSCOPY WITH PROPOFOL;  Surgeon: Lollie Sails, MD;  Location: [facility] ENDOSCOPY;  Service: Endoscopy;  Laterality: N/A;  . ESOPHAGOGASTRODUODENOSCOPY N/A 04/08/2015   Procedure: ESOPHAGOGASTRODUODENOSCOPY (EGD);  Surgeon: Lollie Sails, MD;  Location: [facility] ENDOSCOPY;  Service: Endoscopy;  Laterality: N/A;  . FEMORAL-POPLITEAL BYPASS GRAFT Bilateral 1997, 2000  . leg stent     pvd   Family History  Problem Relation Age of Onset  . Breast cancer Sister   . Hypertension Sister   . Hypertension Brother   . Breast cancer Daughter    Social History   Socioeconomic History  . Marital status: Legally Separated    Spouse name: Not on file  . Number of children: Not on file  . Years of education: Not on file  . Highest education  level: Not on file  Occupational History  . Not on file  Social Needs  . Financial resource strain: Not hard at all  . Food insecurity    Worry: Never true    Inability: Never true  . Transportation needs    Medical: No    Non-medical: No  Tobacco Use  . Smoking status: Former Research scientist (life sciences)  . Smokeless tobacco: Never Used  Substance and Sexual Activity  . Alcohol use: Yes    Alcohol/week: 0.0 standard drinks  . Drug use: No  . Sexual activity: Not on file  Lifestyle  . Physical activity    Days per week: 3 days    Minutes per session: 20 min  . Stress: Only a little  Relationships  . Social Herbalist on phone: Not on file    Gets together: Not on file    Attends religious service: Not on file    Active member of club or organization: Not on file    Attends meetings of clubs or organizations: Not on file    Relationship status: Not on file  Other Topics Concern  . Not on file  Social History Narrative  . Not on file    Outpatient Encounter Medications as of 08/03/2019  Medication Sig  . acetaminophen (TYLENOL) 325  MG tablet Take 650 mg by mouth every 6 (six) hours as needed for pain.  Marland Kitchen aspirin 81 MG tablet Take 81 mg by mouth daily.  . chlorthalidone (HYGROTON) 25 MG tablet Take 1 tablet by mouth once daily  . Cholecalciferol (VITAMIN D3) 2000 UNITS TABS Take by mouth daily.  . clobetasol cream (TEMOVATE) 2.54 % Apply 1 application topically 2 (two) times daily.  . felodipine (PLENDIL) 10 MG 24 hr tablet Take 1 tablet by mouth once daily  . lisinopril (PRINIVIL,ZESTRIL) 20 MG tablet TAKE 2 TABLETS BY MOUTH ONCE DAILY  . lovastatin (MEVACOR) 40 MG tablet Take 1 tablet by mouth once daily  . tadalafil (CIALIS) 10 MG tablet One tablet q 72 hours prn  . vitamin B-12 (CYANOCOBALAMIN) 1000 MCG tablet Take 1,000 mcg by mouth daily.   No facility-administered encounter medications on file as of 08/03/2019.    Review of Systems  Constitutional: Negative for appetite  change and unexpected weight change.  HENT: Negative for congestion and sinus pressure.   Eyes: Negative for pain and visual disturbance.  Respiratory: Negative for cough, chest tightness and shortness of breath.   Cardiovascular: Negative for chest pain and palpitations.  Gastrointestinal: Negative for abdominal pain, diarrhea, nausea and vomiting.  Genitourinary: Negative for difficulty urinating and dysuria.  Musculoskeletal: Negative for joint swelling and myalgias.       Some changes - lower extremity - question of atrophy - right leg.    Skin: Negative for color change and rash.  Neurological: Negative for dizziness, light-headedness and headaches.  Hematological: Negative for adenopathy. Does not bruise/bleed easily.  Psychiatric/Behavioral: Negative for agitation and dysphoric mood.       Objective:    Physical Exam Constitutional:      General: He is not in acute distress.    Appearance: Normal appearance. He is well-developed.  HENT:     Head: Normocephalic and atraumatic.     Right Ear: External ear normal.     Left Ear: External ear normal.  Eyes:     General: No scleral icterus.       Right eye: No discharge.        Left eye: No discharge.     Conjunctiva/sclera: Conjunctivae normal.  Neck:     Musculoskeletal: Neck supple. No muscular tenderness.     Thyroid: No thyromegaly.  Cardiovascular:     Rate and Rhythm: Normal rate and regular rhythm.  Pulmonary:     Effort: No respiratory distress.     Breath sounds: Normal breath sounds. No wheezing.  Abdominal:     General: Bowel sounds are normal.     Palpations: Abdomen is soft.     Tenderness: There is no abdominal tenderness.  Genitourinary:    Comments: Not performed.  Musculoskeletal:        General: No swelling or tenderness.  Lymphadenopathy:     Cervical: No cervical adenopathy.  Skin:    Findings: No erythema or rash.     Comments: Changes skin - right lower extremity (as outlined).    Neurological:     Mental Status: He is alert and oriented to person, place, and time.  Psychiatric:        Mood and Affect: Mood normal.        Behavior: Behavior normal.     BP 132/78   Pulse 87   Temp (!) 97.5 F (36.4 C)   Resp 16   Ht _0  (1.88 m)   Wt (!) 304 lb (  137.9 kg)   SpO2 98%   BMI 39.03 kg/m  Wt Readings from Last 3 Encounters:  08/03/19 (!) 304 lb (137.9 kg)  09/02/18 (!) 311 lb (141.1 kg)  05/02/18 (!) 308 lb 3.2 oz (139.8 kg)     Lab Results  Component Value Date   WBC 5.9 08/03/2019   HGB 12.9 (L) 08/03/2019   HCT 39.0 08/03/2019   PLT 196.0 08/03/2019   GLUCOSE 98 08/03/2019   CHOL 127 06/04/2019   TRIG 154.0 (H) 06/04/2019   HDL 37.70 (L) 06/04/2019   LDLDIRECT 87.0 12/03/2018   LDLCALC 59 06/04/2019   ALT 15 06/04/2019   AST 21 06/04/2019   NA 135 08/03/2019   K 3.9 08/03/2019   CL 99 08/03/2019   CREATININE 0.98 08/03/2019   BUN 14 08/03/2019   CO2 27 08/03/2019   TSH 6.07 (H) 08/03/2019   PSA 1.64 09/03/2017   HGBA1C 5.9 (H) 06/04/2019       Assessment & Plan:   Problem List Items Addressed This Visit    Anemia    Recheck cbc, ferritin and B12 today.        Relevant Orders   CBC with Differential/Platelet (Completed)   Ferritin (Completed)   Vitamin B12 (Completed)   IBC panel (Completed)   Essential hypertension, benign    Blood pressure on recheck as outlined.  Doing well. States outside checks - doing well.  Continue same medication regimen.  Follow pressures.  Follow metabolic panel.        Relevant Orders   TSH (Completed)   Basic metabolic panel (Completed)   Health care maintenance    Physical today 08/03/19.  Colonoscopy 04/08/2015 - diverticulosis and <50m polyp.  Recommended f/u in 5 years.  Overdue psa. Check with next labs.        Hypercholesterolemia    On lovastatin.  Low cholesterol diet and exercise.  Follow lipid panel.       Hyperglycemia    Low carb diet and exercise.  Follow met b and a1c.        [place]    Given changes in lower extremity muscle, his need to stop and rest with increased activity, will refer to [facility] for further evaluation of lower extremity PVD.  Continue lovastatin.        Relevant Orders   Ambulatory referral to Vascular Surgery   Right foot drop    Being followed by neurology.        Weight gain    Has adjusted his diet.  Low some weight.  Follow.        Other Visit Diagnoses    Routine general medical examination at a health care facility    -  Primary   Need for immunization against influenza       Relevant Orders   Flu Vaccine QUAD High Dose(Fluad) (Completed)       CEinar Pheasant MD

## 2019-08-03 NOTE — Assessment & Plan Note (Addendum)
Physical today 08/03/19.  Colonoscopy 04/08/2015 - diverticulosis and <37mm polyp.  Recommended f/u in 5 years.  Overdue psa. Check with next labs.

## 2019-08-04 ENCOUNTER — Other Ambulatory Visit (INDEPENDENT_AMBULATORY_CARE_PROVIDER_SITE_OTHER): Payer: Medicare Other

## 2019-08-04 ENCOUNTER — Other Ambulatory Visit: Payer: Self-pay | Admitting: Internal Medicine

## 2019-08-04 DIAGNOSIS — D649 Anemia, unspecified: Secondary | ICD-10-CM | POA: Diagnosis not present

## 2019-08-04 DIAGNOSIS — R7989 Other specified abnormal findings of blood chemistry: Secondary | ICD-10-CM

## 2019-08-04 LAB — IBC PANEL
Iron: 100 ug/dL (ref 42–165)
Saturation Ratios: 30 % (ref 20.0–50.0)
Transferrin: 238 mg/dL (ref 212.0–360.0)

## 2019-08-04 NOTE — Progress Notes (Signed)
Order placed for f/u tsh.  

## 2019-08-04 NOTE — Progress Notes (Signed)
Order placed for add on labs.   °

## 2019-08-07 ENCOUNTER — Other Ambulatory Visit: Payer: Self-pay | Admitting: Internal Medicine

## 2019-08-07 DIAGNOSIS — I10 Essential (primary) hypertension: Secondary | ICD-10-CM

## 2019-08-09 ENCOUNTER — Encounter: Payer: Self-pay | Admitting: Internal Medicine

## 2019-08-09 ENCOUNTER — Other Ambulatory Visit: Payer: Self-pay | Admitting: Internal Medicine

## 2019-08-09 DIAGNOSIS — Z125 Encounter for screening for malignant neoplasm of prostate: Secondary | ICD-10-CM

## 2019-08-09 NOTE — Assessment & Plan Note (Signed)
Has adjusted his diet.  Low some weight.  Follow.

## 2019-08-09 NOTE — Progress Notes (Signed)
Order placed for psa.  

## 2019-08-09 NOTE — Assessment & Plan Note (Signed)
Blood pressure on recheck as outlined.  Doing well. States outside checks - doing well.  Continue same medication regimen.  Follow pressures.  Follow metabolic panel.

## 2019-08-09 NOTE — Assessment & Plan Note (Signed)
Being followed by neurology.   

## 2019-08-09 NOTE — Assessment & Plan Note (Signed)
Given changes in lower extremity muscle, his need to stop and rest with increased activity, will refer to [facility] for further evaluation of lower extremity PVD.  Continue lovastatin.

## 2019-08-09 NOTE — Assessment & Plan Note (Signed)
Recheck cbc, ferritin and B12 today.

## 2019-08-09 NOTE — Assessment & Plan Note (Signed)
On lovastatin.  Low cholesterol diet and exercise.  Follow lipid panel.

## 2019-08-09 NOTE — Assessment & Plan Note (Signed)
Low carb diet and exercise.  Follow met b and a1c.  

## 2019-08-10 ENCOUNTER — Other Ambulatory Visit: Payer: Self-pay | Admitting: Internal Medicine

## 2019-08-27 ENCOUNTER — Encounter (INDEPENDENT_AMBULATORY_CARE_PROVIDER_SITE_OTHER): Payer: Medicare Other | Admitting: Vascular Surgery

## 2019-09-14 ENCOUNTER — Other Ambulatory Visit: Payer: Self-pay

## 2019-09-14 ENCOUNTER — Encounter (INDEPENDENT_AMBULATORY_CARE_PROVIDER_SITE_OTHER): Payer: Self-pay

## 2019-09-14 ENCOUNTER — Ambulatory Visit (INDEPENDENT_AMBULATORY_CARE_PROVIDER_SITE_OTHER): Payer: Medicare Other | Admitting: Vascular Surgery

## 2019-09-14 ENCOUNTER — Encounter (INDEPENDENT_AMBULATORY_CARE_PROVIDER_SITE_OTHER): Payer: Self-pay | Admitting: Vascular Surgery

## 2019-09-14 VITALS — BP 191/84 | HR 94 | Resp 16 | Ht 74.0 in | Wt 306.0 lb

## 2019-09-14 DIAGNOSIS — E7800 Pure hypercholesterolemia, unspecified: Secondary | ICD-10-CM

## 2019-09-14 DIAGNOSIS — I10 Essential (primary) hypertension: Secondary | ICD-10-CM

## 2019-09-14 DIAGNOSIS — I25118 Atherosclerotic heart disease of native coronary artery with other forms of angina pectoris: Secondary | ICD-10-CM

## 2019-09-14 DIAGNOSIS — I739 Peripheral vascular disease, unspecified: Secondary | ICD-10-CM | POA: Diagnosis not present

## 2019-09-14 DIAGNOSIS — I872 Venous insufficiency (chronic) (peripheral): Secondary | ICD-10-CM

## 2019-09-14 DIAGNOSIS — I2510 Atherosclerotic heart disease of native coronary artery without angina pectoris: Secondary | ICD-10-CM | POA: Insufficient documentation

## 2019-09-14 NOTE — Progress Notes (Signed)
MRN : [id number]  Charles Nichols is a 68 y.o. (08-Jul-1951) male who presents with chief complaint of  Chief Complaint  Patient presents with  . New Patient (Initial Visit)    PVD  .  History of Present Illness:    The patient is seen for evaluation of painful (he states my legs are withering away and loosing strength) lower extremities and diminished pulses. Patient notes the pain is always associated with activity and is very consistent day today. Typically, the pain occurs at less than one block, progress is as activity continues to the point that the patient must stop walking. Resting including standing still for several minutes allowed resumption of the activity and the ability to walk a similar distance before stopping again. Uneven terrain and inclined shorten the distance. The pain has been progressive over the past several years. The patient states the inability to walk is now having a profound negative impact on quality of life and daily activities.  The patient denies rest pain or dangling of an extremity off the side of the bed during the night for relief. No open wounds or sores at this time. No prior interventions or surgeries.  The patient denies changes in claudication symptoms or new rest pain symptoms.  No new ulcers or wounds of the foot.  The patient's blood pressure has been stable and relatively well controlled. The patient denies amaurosis fugax or recent TIA symptoms. There are no recent neurological changes noted. The patient denies history of DVT, PE or superficial thrombophlebitis. The patient denies recent episodes of angina or shortness of breath.   Current Meds  Medication Sig  . acetaminophen (TYLENOL) 325 MG tablet Take 650 mg by mouth every 6 (six) hours as needed for pain.  Marland Kitchen aspirin 81 MG tablet Take 81 mg by mouth daily.  . chlorthalidone (HYGROTON) 25 MG tablet Take 1 tablet by mouth once daily  . Cholecalciferol (VITAMIN D3) 2000 UNITS TABS Take by  mouth daily.  . clobetasol cream (TEMOVATE) [id number] % Apply 1 application topically 2 (two) times daily.  . felodipine (PLENDIL) 10 MG 24 hr tablet Take 1 tablet by mouth once daily  . lisinopril (ZESTRIL) 20 MG tablet Take 2 tablets by mouth once daily  . lovastatin (MEVACOR) 40 MG tablet Take 1 tablet by mouth once daily  . vitamin B-12 (CYANOCOBALAMIN) 1000 MCG tablet Take 1,000 mcg by mouth daily.    Past Medical History:  Diagnosis Date  . Arthritis   . History of chicken pox   . Hypertension   . Obesity   . Peripheral vascular disease ([place])   . Peripheral vascular disease [facility])     Past Surgical History:  Procedure Laterality Date  . CIRCUMCISION    . COLONOSCOPY WITH PROPOFOL N/A 04/08/2015   Procedure: COLONOSCOPY WITH PROPOFOL;  Surgeon: Lollie Sails, MD;  Location: [facility] ENDOSCOPY;  Service: Endoscopy;  Laterality: N/A;  . ESOPHAGOGASTRODUODENOSCOPY N/A 04/08/2015   Procedure: ESOPHAGOGASTRODUODENOSCOPY (EGD);  Surgeon: Lollie Sails, MD;  Location: [facility] ENDOSCOPY;  Service: Endoscopy;  Laterality: N/A;  . FEMORAL-POPLITEAL BYPASS GRAFT Bilateral 1997, 2000  . leg stent     pvd    Social History Social History   Tobacco Use  . Smoking status: Former Research scientist (life sciences)  . Smokeless tobacco: Never Used  Substance Use Topics  . Alcohol use: Yes    Alcohol/week: 0.0 standard drinks  . Drug use: No    Family History Family History  Problem Relation Age of  Onset  . Breast cancer Sister   . Hypertension Sister   . Hypertension Brother   . Breast cancer Daughter   No family history of bleeding/clotting disorders, porphyria or autoimmune disease   No Known Allergies   REVIEW OF SYSTEMS (Negative unless checked)  Constitutional: [] Weight loss  [] Fever  [] Chills Cardiac: [x] Chest pain   [] Chest pressure   [] Palpitations   [] Shortness of breath when laying flat   [] Shortness of breath with exertion. Vascular:  [x] Pain in legs with walking   [] Pain in legs at rest   [] History of DVT   [] Phlebitis   [x] Swelling in legs   [] Varicose veins   [] Non-healing ulcers Pulmonary:   [] Uses home oxygen   [] Productive cough   [] Hemoptysis   [] Wheeze  [] COPD   [] Asthma Neurologic:  [] Dizziness   [] Seizures   [] History of stroke   [] History of TIA  [] Aphasia   [] Vissual changes   [] Weakness or numbness in arm   [x] Weakness or numbness in leg Musculoskeletal:   [] Joint swelling   [x] Joint pain   [] Low back pain Hematologic:  [] Easy bruising  [] Easy bleeding   [] Hypercoagulable state   [] Anemic Gastrointestinal:  [] Diarrhea   [] Vomiting  [] Gastroesophageal reflux/heartburn   [] Difficulty swallowing. Genitourinary:  [] Chronic kidney disease   [] Difficult urination  [] Frequent urination   [] Blood in urine Skin:  [] Rashes   [] Ulcers  Psychological:  [] History of anxiety   []  History of major depression.  Physical Examination  Vitals:   09/14/19 1432  BP: (!) 191/84  Pulse: 94  Resp: 16  Weight: (!) 306 lb (138.8 kg)  Height: 6\' 2"  (1.88 m)   Body mass index is 39.29 kg/m. Gen: WD/WN, NAD Head: [place]/AT, No temporalis wasting.  Ear/Nose/Throat: Hearing grossly intact, nares w/o erythema or drainage, poor dentition Eyes: PER, EOMI, sclera nonicteric.  Neck: Supple, no masses.  No bruit or JVD.  Pulmonary:  Good air movement, clear to auscultation bilaterally, no use of accessory muscles.  Cardiac: RRR, normal S1, S2, no Murmurs. Vascular: scattered varicosities present bilaterally.  Severe venous stasis changes to the legs bilaterally.  2+ soft pitting edema; No carotid bruits Vessel Right Left  Radial Palpable Palpable  PT Not Palpable Not Palpable  DP Not Palpable Not Palpable  Gastrointestinal: soft, non-distended. No guarding/no peritoneal signs.  Musculoskeletal: M/S 5/5 throughout.  No deformity or atrophy.  Neurologic: CN 2-12 intact. Pain and light touch intact in extremities.  Symmetrical.  Speech is fluent. Motor exam as listed above. Psychiatric: Judgment  intact, Mood & affect appropriate for pt's clinical situation. Dermatologic: Severe venous rashes no ulcers noted.  No changes consistent with cellulitis. Lymph : No Cervical lymphadenopathy, no lichenification or skin changes of chronic lymphedema.  CBC Lab Results  Component Value Date   WBC 5.9 08/03/2019   HGB 12.9 (L) 08/03/2019   HCT 39.0 08/03/2019   MCV 82.5 08/03/2019   PLT 196.0 08/03/2019    BMET    Component Value Date/Time   NA 135 08/03/2019 1044   NA 141 05/20/2015 0539   K 3.9 08/03/2019 1044   CL 99 08/03/2019 1044   CO2 27 08/03/2019 1044   GLUCOSE 98 08/03/2019 1044   BUN 14 08/03/2019 1044   BUN 13 05/20/2015 0539   CREATININE 0.98 08/03/2019 1044   CALCIUM 9.8 08/03/2019 1044   CrCl cannot be calculated (Patient's most recent lab result is older than the maximum 21 days allowed.).  COAG No results found for: INR, PROTIME  Radiology  No results found.  Assessment/Plan 1. Peripheral vascular disease ([place]) Recommend:  Patient should undergo arterial duplex of the lower extremity because he has had deterioration in the his lower extremity symptoms.  The patient states they are having increased pain and a marked decrease in the distance that they can walk.  The risks and benefits as well as the alternatives were discussed in detail with the patient.  All questions were answered.  Patient agrees to proceed and understands this could be a prelude to angiography and intervention.  The patient will follow up with me in the office to review the studies.  - LE ARTERIAL; Future - ABI; Future  2. Chronic venous insufficiency No surgery or intervention at this point in time.    I have had a long discussion with the patient regarding venous insufficiency and why it  causes symptoms. I have discussed with the patient the chronic skin changes that accompany venous insufficiency and the long term sequela such as infection and ulceration.  Patient will begin  wearing graduated compression stockings class 1 (15-20 mmHg) or compression wraps on a daily basis a prescription was given. The patient will put the stockings on first thing in the morning and removing them in the evening. The patient is instructed specifically not to sleep in the stockings.    In addition, behavioral modification including several periods of elevation of the lower extremities during the day will be continued. I have demonstrated that proper elevation is a position with the ankles at heart level.  The patient is instructed to begin routine exercise, especially walking on a daily basis  Previous duplex ultrasound of the venous system is negative for DVT or reflux is not present.  3. Essential hypertension, benign Continue antihypertensive medications as already ordered, these medications have been reviewed and there are no changes at this time.   4. Coronary artery disease of native artery of native heart with stable angina pectoris (HCC) Continue cardiac and antihypertensive medications as already ordered and reviewed, no changes at this time.  Continue statin as ordered and reviewed, no changes at this time  Nitrates PRN for chest pain   5. Hypercholesterolemia Continue statin as ordered and reviewed, no changes at this time    Hortencia Pilar, MD  09/14/2019 2:44 PM

## 2019-10-07 ENCOUNTER — Ambulatory Visit (INDEPENDENT_AMBULATORY_CARE_PROVIDER_SITE_OTHER): Payer: Medicare Other

## 2019-10-07 ENCOUNTER — Encounter (INDEPENDENT_AMBULATORY_CARE_PROVIDER_SITE_OTHER): Payer: Medicare Other

## 2019-10-07 ENCOUNTER — Ambulatory Visit (INDEPENDENT_AMBULATORY_CARE_PROVIDER_SITE_OTHER): Payer: Medicare Other | Admitting: Nurse Practitioner

## 2019-10-07 ENCOUNTER — Encounter (INDEPENDENT_AMBULATORY_CARE_PROVIDER_SITE_OTHER): Payer: Self-pay | Admitting: Nurse Practitioner

## 2019-10-07 ENCOUNTER — Other Ambulatory Visit: Payer: Self-pay

## 2019-10-07 VITALS — BP 184/81 | HR 96 | Resp 16 | Wt 307.0 lb

## 2019-10-07 DIAGNOSIS — I10 Essential (primary) hypertension: Secondary | ICD-10-CM | POA: Diagnosis not present

## 2019-10-07 DIAGNOSIS — E7800 Pure hypercholesterolemia, unspecified: Secondary | ICD-10-CM

## 2019-10-07 DIAGNOSIS — I739 Peripheral vascular disease, unspecified: Secondary | ICD-10-CM

## 2019-10-09 ENCOUNTER — Encounter (INDEPENDENT_AMBULATORY_CARE_PROVIDER_SITE_OTHER): Payer: Self-pay | Admitting: Nurse Practitioner

## 2019-10-09 NOTE — Progress Notes (Signed)
SUBJECTIVE:  Patient ID: Charles Nichols, male    DOB: [birth year], 69 y.o.   MRN: [id number] Chief Complaint  Patient presents with  . Follow-up    ultrasound follow up    HPI  Charles Nichols is a 69 y.o. male The patient returns to the office for followup and review of the noninvasive studies. There has been a deterioration in the lower extremity symptoms.  The patient notes interval shortening of their claudication distance and the patient describes his as weakness more than anything. No new ulcers or wounds have occurred since the last visit.  The patient states that he has had bilateral fem-pop bypass grafts about 20 years ago in addition to stents placed.   There have been no significant changes to the patient's overall health care.  The patient denies amaurosis fugax or recent TIA symptoms. There are no recent neurological changes noted. The patient denies history of DVT, PE or superficial thrombophlebitis. The patient denies recent episodes of angina or shortness of breath.   ABI's Rt=0.76 and Lt=0.52 (No previous on record) Duplex US of the lower extremity arterial system shows monophasic waveforms throughout the right lower extremity, the distal common femoral artery displays a 50 to 74% stenosis with velocities being at the upper end.  The left lower extremity has biphasic waveforms at the distal common femoral artery with an occluded SFA showing monophasic waveforms through the popliteal and tibial arteries.  The graft in the left lower extremity is patent however there were only able to visualize the proximal to mid graft due to the depth.  There was no femoropopliteal seen in the right lower extremity.  Past Medical History:  Diagnosis Date  . Arthritis   . History of chicken pox   . Hypertension   . Obesity   . Peripheral vascular disease ([place])   . Peripheral vascular disease [facility])     Past Surgical History:  Procedure Laterality Date  . CIRCUMCISION    . COLONOSCOPY  WITH PROPOFOL N/A 04/08/2015   Procedure: COLONOSCOPY WITH PROPOFOL;  Surgeon: Lollie Sails, MD;  Location[facility] ENDOSCOPY;  Service: Endoscopy;  Laterality: N/A;  . ESOPHAGOGASTRODUODENOSCOPY N/A 04/08/2015   Procedure: ESOPHAGOGASTRODUODENOSCOPY (EGD);  Surgeon: Lollie Sails, MD;  Location[facility] ENDOSCOPY;  Service: Endoscopy;  Laterality: N/A;  . FEMORAL-POPLITEAL BYPASS GRAFT Bilateral 1997, 2000  . leg stent     pvd    Social History   Socioeconomic History  . Marital status: Legally Separated    Spouse name: Not on file  . Number of children: Not on file  . Years of education: Not on file  . Highest education level: Not on file  Occupational History  . Not on file  Tobacco Use  . Smoking status: Former Research scientist (life sciences)  . Smokeless tobacco: Never Used  Substance and Sexual Activity  . Alcohol use: Yes    Alcohol/week: 0.0 standard drinks  . Drug use: No  . Sexual activity: Not on file  Other Topics Concern  . Not on file  Social History Narrative  . Not on file   Social Determinants of Health   Financial Resource Strain: Low Risk   . Difficulty of Paying Living Expenses: Not hard at all  Food Insecurity: No Food Insecurity  . Worried About Charity fundraiser in the Last Year: Never true  . Ran Out of Food in the Last Year: Never true  Transportation Needs: No Transportation Needs  . Lack of Transportation (Medical): No  .  Lack of Transportation (Non-Medical): No  Physical Activity: Insufficiently Active  . Days of Exercise per Week: 3 days  . Minutes of Exercise per Session: 20 min  Stress: No Stress Concern Present  . Feeling of Stress : Only a little  Social Connections:   . Frequency of Communication with Friends and Family: Not on file  . Frequency of Social Gatherings with Friends and Family: Not on file  . Attends Religious Services: Not on file  . Active Member of Clubs or Organizations: Not on file  . Attends Archivist Meetings: Not on file    . Marital Status: Not on file  Intimate Partner Violence:   . Fear of Current or Ex-Partner: Not on file  . Emotionally Abused: Not on file  . Physically Abused: Not on file  . Sexually Abused: Not on file    Family History  Problem Relation Age of Onset  . Breast cancer Sister   . Hypertension Sister   . Hypertension Brother   . Breast cancer Daughter     No Known Allergies   Review of Systems   Review of Systems: Negative Unless Checked Constitutional: [] Weight loss  [] Fever  [] Chills Cardiac: [] Chest pain   []  Atrial Fibrillation  [] Palpitations   [] Shortness of breath when laying flat   [] Shortness of breath with exertion. [] Shortness of breath at rest Vascular:  [] Pain in legs with walking   [] Pain in legs with standing [] Pain in legs when laying flat   [x] Claudication    [] Pain in feet when laying flat    [] History of DVT   [] Phlebitis   [] Swelling in legs   [] Varicose veins   [] Non-healing ulcers Pulmonary:   [] Uses home oxygen   [] Productive cough   [] Hemoptysis   [] Wheeze  [] COPD   [] Asthma Neurologic:  [] Dizziness   [] Seizures  [] Blackouts [] History of stroke   [] History of TIA  [] Aphasia   [] Temporary Blindness   [] Weakness or numbness in arm   [x] Weakness or numbness in leg Musculoskeletal:   [] Joint swelling   [] Joint pain   [] Low back pain  []  History of Knee Replacement [] Arthritis [] back Surgeries  []  Spinal Stenosis    Hematologic:  [] Easy bruising  [] Easy bleeding   [] Hypercoagulable state   [x] Anemic Gastrointestinal:  [] Diarrhea   [] Vomiting  [] Gastroesophageal reflux/heartburn   [] Difficulty swallowing. [] Abdominal pain Genitourinary:  [] Chronic kidney disease   [] Difficult urination  [] Anuric   [] Blood in urine [] Frequent urination  [] Burning with urination   [] Hematuria Skin:  [] Rashes   [] Ulcers [] Wounds Psychological:  [] History of anxiety   []  History of major depression  []  Memory Difficulties      OBJECTIVE:   Physical Exam  BP (!) 184/81 (BP  Location: Right Arm)   Pulse 96   Resp 16   Wt (!) 307 lb (139.3 kg)   BMI 39.42 kg/m   Gen: WD/WN, NAD Head: [place]/AT, No temporalis wasting.  Ear/Nose/Throat: Hearing grossly intact, nares w/o erythema or drainage Eyes: PER, EOMI, sclera nonicteric.  Neck: Supple, no masses.  No JVD.  Pulmonary:  Good air movement, no use of accessory muscles.  Cardiac: RRR Vascular: scattered varicosities, 1+ soft edema  Vessel Right Left  Radial Palpable Palpable  Dorsalis Pedis Not Palpable Not Palpable  Posterior Tibial Not Palpable Not Palpable   Gastrointestinal: soft, non-distended. No guarding/no peritoneal signs.  Musculoskeletal: M/S 5/5 throughout.  No deformity or atrophy.  Neurologic: Pain and light touch intact in extremities.  Symmetrical.  Speech  is fluent. Motor exam as listed above. Psychiatric: Judgment intact, Mood & affect appropriate for pt's clinical situation. Dermatologic: No Venous rashes. No Ulcers Noted.  No changes consistent with cellulitis. Lymph : No Cervical lymphadenopathy, no lichenification or skin changes of chronic lymphedema.       ASSESSMENT AND PLAN:  1. Peripheral vascular disease ([place])  Recommend:  The patient has evidence of atherosclerosis of the lower extremities with claudication.  The patient does not voice lifestyle limiting changes at this point in time.  Noninvasive studies do not suggest clinically significant change.  No invasive studies, angiography or surgery at this time The patient should continue walking and begin a more formal exercise program.  The patient should continue antiplatelet therapy and aggressive treatment of the lipid abnormalities  No changes in the patient's medications at this time  The patient should continue wearing graduated compression socks 10-15 mmHg strength to control the mild edema.   The patient will follow up in 6 months in non-invasive studies   2. Essential hypertension, benign Continue  antihypertensive medications as already ordered, these medications have been reviewed and there are no changes at this time.   3. Hypercholesterolemia Continue statin as ordered and reviewed, no changes at this time    Current Outpatient Medications on File Prior to Visit  Medication Sig Dispense Refill  . acetaminophen (TYLENOL) 325 MG tablet Take 650 mg by mouth every 6 (six) hours as needed for pain.    Marland Kitchen aspirin 81 MG tablet Take 81 mg by mouth daily.    . chlorthalidone (HYGROTON) 25 MG tablet Take 1 tablet by mouth once daily 90 tablet 0  . Cholecalciferol (VITAMIN D3) 2000 UNITS TABS Take by mouth daily.    . clobetasol cream (TEMOVATE) [id number] % Apply 1 application topically 2 (two) times daily. 30 g 0  . felodipine (PLENDIL) 10 MG 24 hr tablet Take 1 tablet by mouth once daily 90 tablet 1  . lisinopril (ZESTRIL) 20 MG tablet Take 2 tablets by mouth once daily 180 tablet 0  . lovastatin (MEVACOR) 40 MG tablet Take 1 tablet by mouth once daily 90 tablet 1  . vitamin B-12 (CYANOCOBALAMIN) 1000 MCG tablet Take 1,000 mcg by mouth daily.    . tadalafil (CIALIS) 10 MG tablet One tablet q 72 hours prn (Patient not taking: Reported on 09/14/2019) 10 tablet 0   No current facility-administered medications on file prior to visit.    There are no Patient Instructions on file for this visit. No follow-ups on file.   Kris Hartmann, NP  This note was completed with Sales executive.  Any errors are purely unintentional.

## 2019-11-05 ENCOUNTER — Ambulatory Visit (INDEPENDENT_AMBULATORY_CARE_PROVIDER_SITE_OTHER): Payer: Medicare Other | Admitting: Internal Medicine

## 2019-11-05 ENCOUNTER — Other Ambulatory Visit: Payer: Self-pay

## 2019-11-05 VITALS — BP 136/79 | Ht 74.0 in | Wt 307.0 lb

## 2019-11-05 DIAGNOSIS — I739 Peripheral vascular disease, unspecified: Secondary | ICD-10-CM

## 2019-11-05 DIAGNOSIS — E7800 Pure hypercholesterolemia, unspecified: Secondary | ICD-10-CM | POA: Diagnosis not present

## 2019-11-05 DIAGNOSIS — R739 Hyperglycemia, unspecified: Secondary | ICD-10-CM | POA: Diagnosis not present

## 2019-11-05 DIAGNOSIS — M21371 Foot drop, right foot: Secondary | ICD-10-CM

## 2019-11-05 DIAGNOSIS — Z125 Encounter for screening for malignant neoplasm of prostate: Secondary | ICD-10-CM

## 2019-11-05 DIAGNOSIS — D649 Anemia, unspecified: Secondary | ICD-10-CM

## 2019-11-05 DIAGNOSIS — I10 Essential (primary) hypertension: Secondary | ICD-10-CM

## 2019-11-05 DIAGNOSIS — I25118 Atherosclerotic heart disease of native coronary artery with other forms of angina pectoris: Secondary | ICD-10-CM | POA: Diagnosis not present

## 2019-11-05 DIAGNOSIS — Z86010 Personal history of colonic polyps: Secondary | ICD-10-CM

## 2019-11-05 DIAGNOSIS — R7989 Other specified abnormal findings of blood chemistry: Secondary | ICD-10-CM

## 2019-11-05 MED ORDER — LOVASTATIN 40 MG PO TABS: 40.0000 mg | ORAL_TABLET | Freq: Every day | ORAL | 1 refills | Status: DC

## 2019-11-05 MED ORDER — CHLORTHALIDONE 25 MG PO TABS: 25.0000 mg | ORAL_TABLET | Freq: Every day | ORAL | 1 refills | Status: DC

## 2019-11-05 MED ORDER — LISINOPRIL 20 MG PO TABS: 40.0000 mg | ORAL_TABLET | Freq: Every day | ORAL | 1 refills | Status: DC

## 2019-11-05 NOTE — Progress Notes (Signed)
Patient ID: Charles Nichols, male   DOB: December 19, 1950, 69 y.o.   MRN: [id number]   Virtual Visit via telephone Note  This visit type was conducted due to national recommendations for restrictions regarding the COVID-19 pandemic (e.g. social distancing).  This format is felt to be most appropriate for this patient at this time.  All issues noted in this document were discussed and addressed.  No physical exam was performed (except for noted visual exam findings with Video Visits).   I connected with Devonn Helin telephone and verified that I am speaking with the correct person using two identifiers. Location patient: home Location provider: work  Persons participating in the telephone visit: patient, provider  I discussed the limitations, risks, security and privacy concerns of performing an evaluation and management service by telephone and the availability of in person appointments.  The patient expressed understanding and agreed to proceed.   Reason for visit: scheduled follow up.  HPI: He reports he is doing well.  Feels good.  Trying to stay active.  No chest pain or sob with increased activity or exertion.  No acid reflux.  No abdominal pain.  Bowels moving.  Just evaluated by [facility].  Non invasive studies - no change.  Recommended f/u in 6 months.  Sees neurology for neuropathy and right foot drop.  Left foot "kicks out" to the side when he walks.  Unchanged from his last neurology visit.  No falls.  Neurology following.  No skin lesion - feet or lower extremities.  Discussed colonoscopy.  Had colonoscopy 04/2015.  Recommended f/u in 5 years.  Blood pressures when he first checks - 148/72.  Just sits for a few minutes and decreases to 630Z systolic.  Weight stable.  Discussed diet and exercise.  Overall he feels he is doing well.     ROS: See pertinent positives and negatives per HPI.  Past Medical History:  Diagnosis Date  . Arthritis   . History of chicken pox   . Hypertension   . Obesity     . Peripheral vascular disease ([place])   . Peripheral vascular disease [facility])     Past Surgical History:  Procedure Laterality Date  . CIRCUMCISION    . COLONOSCOPY WITH PROPOFOL N/A 04/08/2015   Procedure: COLONOSCOPY WITH PROPOFOL;  Surgeon: Lollie Sails, MD;  Location[facility] ENDOSCOPY;  Service: Endoscopy;  Laterality: N/A;  . ESOPHAGOGASTRODUODENOSCOPY N/A 04/08/2015   Procedure: ESOPHAGOGASTRODUODENOSCOPY (EGD);  Surgeon: Lollie Sails, MD;  Location: [facility] ENDOSCOPY;  Service: Endoscopy;  Laterality: N/A;  . FEMORAL-POPLITEAL BYPASS GRAFT Bilateral 1997, 2000  . leg stent     pvd    Family History  Problem Relation Age of Onset  . Breast cancer Sister   . Hypertension Sister   . Hypertension Brother   . Breast cancer Daughter     SOCIAL HX: reviewed.    Current Outpatient Medications:  .  acetaminophen (TYLENOL) 325 MG tablet, Take 650 mg by mouth every 6 (six) hours as needed for pain., Disp: , Rfl:  .  aspirin 81 MG tablet, Take 81 mg by mouth daily., Disp: , Rfl:  .  chlorthalidone (HYGROTON) 25 MG tablet, Take 1 tablet (25 mg total) by mouth daily., Disp: 90 tablet, Rfl: 1 .  Cholecalciferol (VITAMIN D3) 2000 UNITS TABS, Take by mouth daily., Disp: , Rfl:  .  clobetasol cream (TEMOVATE) 6.01 %, Apply 1 application topically 2 (two) times daily., Disp: 30 g, Rfl: 0 .  felodipine (PLENDIL) 10 MG  24 hr tablet, Take 1 tablet by mouth once daily, Disp: 90 tablet, Rfl: 1 .  lisinopril (ZESTRIL) 20 MG tablet, Take 2 tablets (40 mg total) by mouth daily., Disp: 180 tablet, Rfl: 1 .  lovastatin (MEVACOR) 40 MG tablet, Take 1 tablet (40 mg total) by mouth daily., Disp: 90 tablet, Rfl: 1 .  tadalafil (CIALIS) 10 MG tablet, One tablet q 72 hours prn (Patient not taking: Reported on 09/14/2019), Disp: 10 tablet, Rfl: 0 .  vitamin B-12 (CYANOCOBALAMIN) 1000 MCG tablet, Take 1,000 mcg by mouth daily., Disp: , Rfl:   EXAM:  VITALS per patient if applicable: 654/65  GENERAL:  alert.  Sounds to be in no acute distress.  Answering questions appropriately.   PSYCH/NEURO: pleasant and cooperative, no obvious depression or anxiety, speech and thought processing grossly intact  ASSESSMENT AND PLAN:  Discussed the following assessment and plan:  Anemia Follow cbc and iron studies.   CAD (coronary artery disease) No chest pain.  Continue risk factor modification.   Essential hypertension, benign Blood pressure as outlined.  Continue current medication regimen.  Follow pressures.  Follow metabolic panel.    History of colonic polyps Last colonoscopy 04/2015.  Recommended f/u in 5 years.  Discussed today.   Hypercholesterolemia On lovastatin.  Low cholesterol diet and exercise.  Follow lipid panel and liver function tests.    Hyperglycemia Low carb diet and exercise.  Follow met b and a1c.   Peripheral vascular disease Recently evaluated by [facility].  Non invasive studies - no change.  Continue risk factor modification.  Follow.    Right foot drop Followed by neurology.  Stable.     Orders Placed This Encounter  Procedures  . CBC with Differential/Platelet    Standing Status:   Future    Standing Expiration Date:   11/04/2020  . Hepatic function panel    Standing Status:   Future    Standing Expiration Date:   11/04/2020  . Lipid panel    Standing Status:   Future    Standing Expiration Date:   11/04/2020  . TSH    Standing Status:   Future    Standing Expiration Date:   11/04/2020  . Basic metabolic panel    Standing Status:   Future    Standing Expiration Date:   11/04/2020  . PSA, Medicare    Standing Status:   Future    Standing Expiration Date:   11/04/2020  . Hemoglobin A1c    Standing Status:   Future    Standing Expiration Date:   11/04/2020  . IBC + Ferritin    Standing Status:   Future    Standing Expiration Date:   11/04/2020    Meds ordered this encounter  Medications  . chlorthalidone (HYGROTON) 25 MG tablet    Sig: Take 1 tablet (25  mg total) by mouth daily.    Dispense:  90 tablet    Refill:  1  . lisinopril (ZESTRIL) 20 MG tablet    Sig: Take 2 tablets (40 mg total) by mouth daily.    Dispense:  180 tablet    Refill:  1  . lovastatin (MEVACOR) 40 MG tablet    Sig: Take 1 tablet (40 mg total) by mouth daily.    Dispense:  90 tablet    Refill:  1     I discussed the assessment and treatment plan with the patient. The patient was provided an opportunity to ask questions and all were answered. The patient  agreed with the plan and demonstrated an understanding of the instructions.   The patient was advised to call back or seek an in-person evaluation if the symptoms worsen or if the condition fails to improve as anticipated.  I provided 17 minutes of non-face-to-face time during this encounter.   Einar Pheasant, MD

## 2019-11-08 ENCOUNTER — Encounter: Payer: Self-pay | Admitting: Internal Medicine

## 2019-11-08 NOTE — Assessment & Plan Note (Signed)
Follow cbc and iron studies.  

## 2019-11-08 NOTE — Assessment & Plan Note (Signed)
On lovastatin.  Low cholesterol diet and exercise.  Follow lipid panel and liver function tests.   

## 2019-11-08 NOTE — Assessment & Plan Note (Signed)
Recently evaluated by [facility].  Non invasive studies - no change.  Continue risk factor modification.  Follow.

## 2019-11-08 NOTE — Assessment & Plan Note (Signed)
No chest pain.  Continue risk factor modification.  

## 2019-11-08 NOTE — Assessment & Plan Note (Signed)
Last colonoscopy 04/2015.  Recommended f/u in 5 years.  Discussed today.

## 2019-11-08 NOTE — Assessment & Plan Note (Signed)
Low carb diet and exercise.  Follow met b and a1c.  

## 2019-11-08 NOTE — Assessment & Plan Note (Signed)
Followed by neurology.  Stable  

## 2019-11-08 NOTE — Assessment & Plan Note (Signed)
Blood pressure as outlined.  Continue current medication regimen.  Follow pressures.  Follow metabolic panel.  

## 2019-11-19 ENCOUNTER — Ambulatory Visit: Payer: Medicare Other | Attending: Internal Medicine

## 2019-11-19 ENCOUNTER — Other Ambulatory Visit: Payer: Self-pay

## 2019-11-19 DIAGNOSIS — Z23 Encounter for immunization: Secondary | ICD-10-CM | POA: Insufficient documentation

## 2019-11-19 NOTE — Progress Notes (Signed)
   Covid-19 Vaccination Clinic  Name:  Charles Nichols    MRN: [id number] DOB: Jul 23, 1951  11/19/2019  [name] was observed post Covid-19 immunization for 15 minutes without incidence. He was provided with Vaccine Information Sheet and instruction to access the V-Safe system.   [name] was instructed to call 911 with any severe reactions post vaccine: Marland Kitchen Difficulty breathing  . Swelling of your face and throat  . A fast heartbeat  . A bad rash all over your body  . Dizziness and weakness    Immunizations Administered    Name Date Dose VIS Date Route   Moderna COVID-19 Vaccine 11/19/2019  1:05 PM 0.5 mL 09/08/2019 Intramuscular   Manufacturer: Moderna   Lot: [id number]   [id number]

## 2019-12-09 ENCOUNTER — Other Ambulatory Visit: Payer: Medicare Other

## 2019-12-16 ENCOUNTER — Other Ambulatory Visit: Payer: Self-pay

## 2019-12-16 ENCOUNTER — Other Ambulatory Visit (INDEPENDENT_AMBULATORY_CARE_PROVIDER_SITE_OTHER): Payer: Medicare Other

## 2019-12-16 DIAGNOSIS — R739 Hyperglycemia, unspecified: Secondary | ICD-10-CM | POA: Diagnosis not present

## 2019-12-16 DIAGNOSIS — E7800 Pure hypercholesterolemia, unspecified: Secondary | ICD-10-CM

## 2019-12-16 DIAGNOSIS — Z125 Encounter for screening for malignant neoplasm of prostate: Secondary | ICD-10-CM | POA: Diagnosis not present

## 2019-12-16 DIAGNOSIS — R7989 Other specified abnormal findings of blood chemistry: Secondary | ICD-10-CM | POA: Diagnosis not present

## 2019-12-16 DIAGNOSIS — I10 Essential (primary) hypertension: Secondary | ICD-10-CM | POA: Diagnosis not present

## 2019-12-16 LAB — BASIC METABOLIC PANEL
BUN: 12 mg/dL (ref 6–23)
CO2: 30 mEq/L (ref 19–32)
Calcium: 9.7 mg/dL (ref 8.4–10.5)
Chloride: 98 mEq/L (ref 96–112)
Creatinine, Ser: 0.89 mg/dL (ref 0.40–1.50)
GFR: 102.62 mL/min (ref >60.00)
Glucose, Bld: 108 mg/dL — ABNORMAL HIGH (ref 70–99)
Potassium: 3.9 mEq/L (ref 3.5–5.1)
Sodium: 135 mEq/L (ref 135–145)

## 2019-12-16 LAB — CBC WITH DIFFERENTIAL/PLATELET
Basophils Absolute: 0.1 10*3/uL (ref 0.0–0.1)
Basophils Relative: 1 % (ref 0.0–3.0)
Eosinophils Absolute: 0.3 10*3/uL (ref 0.0–0.7)
Eosinophils Relative: 3.9 % (ref 0.0–5.0)
HCT: 37.4 % — ABNORMAL LOW (ref 39.0–52.0)
Hemoglobin: 12.6 g/dL — ABNORMAL LOW (ref 13.0–17.0)
Lymphocytes Relative: 22 % (ref 12.0–46.0)
Lymphs Abs: 1.6 10*3/uL (ref 0.7–4.0)
MCHC: 33.7 g/dL (ref 30.0–36.0)
MCV: 83 fl (ref 78.0–100.0)
Monocytes Absolute: 0.7 10*3/uL (ref 0.1–1.0)
Monocytes Relative: 9.1 % (ref 3.0–12.0)
Neutro Abs: 4.7 10*3/uL (ref 1.4–7.7)
Neutrophils Relative %: 64 % (ref 43.0–77.0)
Platelets: 218 10*3/uL (ref 150.0–400.0)
RBC: 4.5 Mil/uL (ref 4.22–5.81)
RDW: 13.9 % (ref 11.5–15.5)
WBC: 7.3 10*3/uL (ref 4.0–10.5)

## 2019-12-16 LAB — HEPATIC FUNCTION PANEL
ALT: 16 U/L (ref 0–53)
AST: 19 U/L (ref 0–37)
Albumin: 4 g/dL (ref 3.5–5.2)
Alkaline Phosphatase: 66 U/L (ref 39–117)
Bilirubin, Direct: 0.1 mg/dL (ref 0.0–0.3)
Total Bilirubin: 0.5 mg/dL (ref 0.2–1.2)
Total Protein: 7.4 g/dL (ref 6.0–8.3)

## 2019-12-16 LAB — LIPID PANEL
Cholesterol: 140 mg/dL (ref 0–200)
HDL: 40.3 mg/dL (ref >39.00)
LDL Cholesterol: 59 mg/dL (ref 0–99)
NonHDL: 99.22
Total CHOL/HDL Ratio: 3
Triglycerides: 200 mg/dL — ABNORMAL HIGH (ref 0.0–149.0)
VLDL: 40 mg/dL (ref 0.0–40.0)

## 2019-12-16 LAB — IBC + FERRITIN
Ferritin: 739.5 ng/mL — ABNORMAL HIGH (ref 22.0–322.0)
Iron: 65 ug/dL (ref 42–165)
Saturation Ratios: 20.1 % (ref 20.0–50.0)
Transferrin: 231 mg/dL (ref 212.0–360.0)

## 2019-12-16 LAB — TSH: TSH: 4.39 u[IU]/mL (ref 0.35–4.50)

## 2019-12-16 LAB — HEMOGLOBIN A1C: Hgb A1c MFr Bld: 5.8 % (ref 4.6–6.5)

## 2019-12-16 LAB — PSA, MEDICARE: PSA: 1.23 ng/ml (ref 0.10–4.00)

## 2019-12-21 ENCOUNTER — Ambulatory Visit: Payer: Medicare Other | Attending: Internal Medicine

## 2019-12-21 DIAGNOSIS — Z23 Encounter for immunization: Secondary | ICD-10-CM

## 2019-12-21 NOTE — Progress Notes (Signed)
   Covid-19 Vaccination Clinic  Name:  Charles Nichols    MRN: [id number] DOB: [birth year]  12/21/2019  [name] was observed post Covid-19 immunization for 15 minutes without incident. He was provided with Vaccine Information Sheet and instruction to access the V-Safe system.   [name] was instructed to call 911 with any severe reactions post vaccine: Marland Kitchen Difficulty breathing  . Swelling of face and throat  . A fast heartbeat  . A bad rash all over body  . Dizziness and weakness   Immunizations Administered    Name Date Dose VIS Date Route   Moderna COVID-19 Vaccine 12/21/2019 11:48 AM 0.5 mL 09/08/2019 Intramuscular   Manufacturer: Moderna   Lot: [id number]   [id number]

## 2019-12-26 ENCOUNTER — Telehealth: Payer: Self-pay | Admitting: Internal Medicine

## 2019-12-26 ENCOUNTER — Encounter: Payer: Self-pay | Admitting: Internal Medicine

## 2019-12-26 DIAGNOSIS — R7989 Other specified abnormal findings of blood chemistry: Secondary | ICD-10-CM | POA: Insufficient documentation

## 2019-12-26 NOTE — Telephone Encounter (Signed)
-----   Message from Lloyd Huger, MD sent at 12/25/2019  6:46 AM EDT ----- Regarding: RE: question No problem Dicy Smigel!  Nothing jumps out at me from the labs that is concerning. Agree with colonoscopy.  The ferritin is likely an acute phase reactant as you say, but you could check for a hemochromatosis mutation for completeness.  -Tim ----- Message ----- From: Einar Pheasant, MD Sent: 12/25/2019   6:00 AM EDT To: Lloyd Huger, MD Subject: question                                       If you do not mind, I would like to have you review this patients labs.  This patient has a history of anemia (slight).  Due this year for follow up colonoscopy.  His ferritin has remained elevated.  Remainder of iron stores ok.  (Have assumed acute phase reactant).  Is there anything more that I need to do regarding w/up / evaluation?  I appreciate you reviewing.  Thank you for your help.   Einar Pheasant

## 2019-12-29 DIAGNOSIS — L309 Dermatitis, unspecified: Secondary | ICD-10-CM | POA: Diagnosis not present

## 2020-01-28 DIAGNOSIS — L239 Allergic contact dermatitis, unspecified cause: Secondary | ICD-10-CM | POA: Diagnosis not present

## 2020-01-28 DIAGNOSIS — L309 Dermatitis, unspecified: Secondary | ICD-10-CM | POA: Diagnosis not present

## 2020-02-26 ENCOUNTER — Other Ambulatory Visit: Payer: Self-pay | Admitting: Internal Medicine

## 2020-03-09 ENCOUNTER — Telehealth (INDEPENDENT_AMBULATORY_CARE_PROVIDER_SITE_OTHER): Payer: Medicare Other | Admitting: Internal Medicine

## 2020-03-09 ENCOUNTER — Other Ambulatory Visit: Payer: Self-pay

## 2020-03-09 DIAGNOSIS — M21371 Foot drop, right foot: Secondary | ICD-10-CM

## 2020-03-09 DIAGNOSIS — I25118 Atherosclerotic heart disease of native coronary artery with other forms of angina pectoris: Secondary | ICD-10-CM

## 2020-03-09 DIAGNOSIS — R739 Hyperglycemia, unspecified: Secondary | ICD-10-CM | POA: Diagnosis not present

## 2020-03-09 DIAGNOSIS — I739 Peripheral vascular disease, unspecified: Secondary | ICD-10-CM | POA: Diagnosis not present

## 2020-03-09 DIAGNOSIS — Z860100 Personal history of colon polyps, unspecified: Secondary | ICD-10-CM

## 2020-03-09 DIAGNOSIS — R7989 Other specified abnormal findings of blood chemistry: Secondary | ICD-10-CM

## 2020-03-09 DIAGNOSIS — D649 Anemia, unspecified: Secondary | ICD-10-CM

## 2020-03-09 DIAGNOSIS — I10 Essential (primary) hypertension: Secondary | ICD-10-CM

## 2020-03-09 DIAGNOSIS — E7800 Pure hypercholesterolemia, unspecified: Secondary | ICD-10-CM | POA: Diagnosis not present

## 2020-03-09 NOTE — Progress Notes (Signed)
Patient ID: Charles Nichols, male   DOB: 1950-10-16, 69 y.o.   MRN: [id number]   Virtual Visit via telephone Note  This visit type was conducted due to national recommendations for restrictions regarding the COVID-19 pandemic (e.g. social distancing).  This format is felt to be most appropriate for this patient at this time.  All issues noted in this document were discussed and addressed.  No physical exam was performed (except for noted visual exam findings with Video Visits).   I connected with Kaien Mogle by telephone and verified that I am speaking with the correct person using two identifiers. Location patient: home Location provider: work Persons participating in the telephone visit: patient, provider  The limitations, risks, security and privacy concerns of performing an evaluation and management service by telephone and the availability of in person appointments. It has also been discussed with the patient that there may be a patient responsible charge related to this service. The patient expressed understanding and agreed to proceed.   Reason for visit: scheduled follow up.    HPI: Scheduled follow up - to follow up regarding elevated blood pressure and cholesterol.  Having some issues with nasal congestion - allergies.  States the last few days (with weather change) - reports nasal stuffiness.  Started using saline nasal spray regularly.  This is helping.  No sinus pressure.  No chest congestion or cough.  No fever.  No colored mucus.  Trying to stay active.  No chest pain or sob.  No acid reflux.  No abdominal pain.  Bowels moving.  Does have right foot drop.  Balance - overall better.  Does have intermittent episodes of left foot pain.  Occurs q 2-3 months.  Localized to top of foot.  Last for a few days.  Uses tylenol arthritis and occasional voltaren gel.  Does not use this regularly.  No pain now.  Swelling better - wearing compression socks.  Blood pressures averaging 130s/70s.   Discussed need for colonoscopy.     ROS: See pertinent positives and negatives per HPI.  Past Medical History:  Diagnosis Date  . Arthritis   . History of chicken pox   . Hypertension   . Obesity   . Peripheral vascular disease ([place])   . Peripheral vascular disease [facility])     Past Surgical History:  Procedure Laterality Date  . CIRCUMCISION    . COLONOSCOPY WITH PROPOFOL N/A 04/08/2015   Procedure: COLONOSCOPY WITH PROPOFOL;  Surgeon: Lollie Sails, MD;  Location[facility] ENDOSCOPY;  Service: Endoscopy;  Laterality: N/A;  . ESOPHAGOGASTRODUODENOSCOPY N/A 04/08/2015   Procedure: ESOPHAGOGASTRODUODENOSCOPY (EGD);  Surgeon: Lollie Sails, MD;  Location: [facility] ENDOSCOPY;  Service: Endoscopy;  Laterality: N/A;  . FEMORAL-POPLITEAL BYPASS GRAFT Bilateral 1997, 2000  . leg stent     pvd    Family History  Problem Relation Age of Onset  . Breast cancer Sister   . Hypertension Sister   . Hypertension Brother   . Breast cancer Daughter     SOCIAL HX: reviewed.    Current Outpatient Medications:  .  acetaminophen (TYLENOL) 325 MG tablet, Take 650 mg by mouth every 6 (six) hours as needed for pain., Disp: , Rfl:  .  aspirin 81 MG tablet, Take 81 mg by mouth daily., Disp: , Rfl:  .  chlorthalidone (HYGROTON) 25 MG tablet, Take 1 tablet (25 mg total) by mouth daily., Disp: 90 tablet, Rfl: 1 .  Cholecalciferol (VITAMIN D3) 2000 UNITS TABS, Take by mouth daily., Disp: ,  Rfl:  .  clobetasol cream (TEMOVATE) 0.05 %, Apply 1 application topically 2 (two) times daily., Disp: 30 g, Rfl: 0 .  felodipine (PLENDIL) 10 MG 24 hr tablet, Take 1 tablet by mouth once daily, Disp: 90 tablet, Rfl: 0 .  lisinopril (ZESTRIL) 20 MG tablet, Take 2 tablets (40 mg total) by mouth daily., Disp: 180 tablet, Rfl: 1 .  lovastatin (MEVACOR) 40 MG tablet, Take 1 tablet (40 mg total) by mouth daily., Disp: 90 tablet, Rfl: 1 .  vitamin B-12 (CYANOCOBALAMIN) 1000 MCG tablet, Take 1,000 mcg by mouth daily., Disp: ,  Rfl:   EXAM:  GENERAL: alert. Sound to be in no acute distress.  Answering questions appropriately.    PSYCH/NEURO: pleasant and cooperative, no obvious depression or anxiety, speech and thought processing grossly intact  ASSESSMENT AND PLAN:  Discussed the following assessment and plan:  Right foot drop Has been followed by neurology.  Stable.  States balance is better overall.  Intermittent foot pain as outlined.  May occur once q 2-3 months.  No pain currently.  Discussed further w/up and evaluation.  Wants to monitor.    Peripheral vascular disease Sees [facility].  Continue risk factor modification.  No pain with ambulation.    Hyperglycemia Low carb diet and exercise.  Follow met b and a1c.   Hypercholesterolemia On lovastatin.  Low cholesterol diet and exercise.  Follow lipid panel and liver function tests.    History of colonic polyps Last colonoscopy in 04/2015.  Due f/u colonoscopy this year.  He prefers to have colonoscopy at [facility].  Will call back with GI MD name.    Essential hypertension, benign Blood pressure as outlined.  Continue lisinopril and chlorthalidone and plendil.  Follow pressures.  Follow metabolic panel.   Elevated ferritin Discussed with hematology as outlined.  Follow.    CAD (coronary artery disease) No chest pain.  Continue risk factor modification.    Anemia Follow cbc and iron studies.    No orders of the defined types were placed in this encounter.   No orders of the defined types were placed in this encounter.    I discussed the assessment and treatment plan with the patient. The patient was provided an opportunity to ask questions and all were answered. The patient agreed with the plan and demonstrated an understanding of the instructions.   The patient was advised to call back or seek an in-person evaluation if the symptoms worsen or if the condition fails to improve as anticipated.  I provided 25 minutes of  non-face-to-face time during this encounter.   Charlene Scott, MD  

## 2020-03-13 ENCOUNTER — Encounter: Payer: Self-pay | Admitting: Internal Medicine

## 2020-03-13 NOTE — Assessment & Plan Note (Signed)
Discussed with hematology as outlined.  Follow.

## 2020-03-13 NOTE — Assessment & Plan Note (Signed)
On lovastatin.  Low cholesterol diet and exercise.  Follow lipid panel and liver function tests.   

## 2020-03-13 NOTE — Assessment & Plan Note (Signed)
Sees [facility].  Continue risk factor modification.  No pain with ambulation.

## 2020-03-13 NOTE — Assessment & Plan Note (Signed)
Blood pressure as outlined.  Continue lisinopril and chlorthalidone and plendil.  Follow pressures.  Follow metabolic panel.

## 2020-03-13 NOTE — Assessment & Plan Note (Signed)
Has been followed by neurology.  Stable.  States balance is better overall.  Intermittent foot pain as outlined.  May occur once q 2-3 months.  No pain currently.  Discussed further w/up and evaluation.  Wants to monitor.

## 2020-03-13 NOTE — Assessment & Plan Note (Signed)
Low carb diet and exercise.  Follow met b and a1c.  

## 2020-03-13 NOTE — Assessment & Plan Note (Signed)
Last colonoscopy in 04/2015.  Due f/u colonoscopy this year.  He prefers to have colonoscopy at [facility].  Will call back with GI MD name.

## 2020-03-13 NOTE — Assessment & Plan Note (Signed)
Follow cbc and iron studies.  

## 2020-03-13 NOTE — Assessment & Plan Note (Signed)
No chest pain.  Continue risk factor modification.  

## 2020-03-17 ENCOUNTER — Telehealth: Payer: Self-pay | Admitting: Internal Medicine

## 2020-03-17 DIAGNOSIS — Z1211 Encounter for screening for malignant neoplasm of colon: Secondary | ICD-10-CM

## 2020-03-17 NOTE — Telephone Encounter (Signed)
Pt called in to let Dr.Scott know the doctor name he wanted for colonoscopy now that he is living in [place] the doctor name is Dr. Lewanda Rife.

## 2020-03-18 NOTE — Telephone Encounter (Signed)
Order placed for GI referral to Dr Lewanda Rife.

## 2020-03-30 ENCOUNTER — Ambulatory Visit: Payer: Medicare Other

## 2020-03-31 ENCOUNTER — Ambulatory Visit (INDEPENDENT_AMBULATORY_CARE_PROVIDER_SITE_OTHER): Payer: Medicare Other

## 2020-03-31 VITALS — Ht 74.0 in | Wt 307.0 lb

## 2020-03-31 DIAGNOSIS — Z0000 Encounter for general adult medical examination without abnormal findings: Secondary | ICD-10-CM

## 2020-03-31 NOTE — Progress Notes (Addendum)
Subjective:   Charles Nichols is a 69 y.o. male who presents for Medicare Annual/Subsequent preventive examination.  Review of Systems    No ROS.  Medicare Wellness Virtual Visit.   Cardiac Risk Factors include: advanced age (>79men, >28 women);male gender;hypertension     Objective:    Today's Vitals   03/31/20 1238  Weight: (!) 307 lb (139.3 kg)  Height: 6\' 2"  (1.88 m)   Body mass index is 39.42 kg/m.  Advanced Directives 03/31/2020 03/30/2019 03/27/2018 03/16/2016 04/08/2015  Does Patient Have a Medical Advance Directive? No No No No No  Would patient like information on creating a medical advance directive? No - Patient declined Yes (MAU/Ambulatory/Procedural Areas - Information given) Yes (MAU/Ambulatory/Procedural Areas - Information given) Yes - Educational materials given -    Current Medications (verified) Outpatient Encounter Medications as of 03/31/2020  Medication Sig  . acetaminophen (TYLENOL) 325 MG tablet Take 650 mg by mouth every 6 (six) hours as needed for pain.  Marland Kitchen aspirin 81 MG tablet Take 81 mg by mouth daily.  . chlorthalidone (HYGROTON) 25 MG tablet Take 1 tablet (25 mg total) by mouth daily.  . Cholecalciferol (VITAMIN D3) 2000 UNITS TABS Take by mouth daily.  . clobetasol cream (TEMOVATE) 3.71 % Apply 1 application topically 2 (two) times daily.  . felodipine (PLENDIL) 10 MG 24 hr tablet Take 1 tablet by mouth once daily  . lisinopril (ZESTRIL) 20 MG tablet Take 2 tablets (40 mg total) by mouth daily.  Marland Kitchen lovastatin (MEVACOR) 40 MG tablet Take 1 tablet (40 mg total) by mouth daily.  . vitamin B-12 (CYANOCOBALAMIN) 1000 MCG tablet Take 1,000 mcg by mouth daily.   No facility-administered encounter medications on file as of 03/31/2020.    Allergies (verified) Patient has no known allergies.   History: Past Medical History:  Diagnosis Date  . Arthritis   . History of chicken pox   . Hypertension   . Obesity   . Peripheral vascular disease ([place])   .  Peripheral vascular disease [facility])    Past Surgical History:  Procedure Laterality Date  . CIRCUMCISION    . COLONOSCOPY WITH PROPOFOL N/A 04/08/2015   Procedure: COLONOSCOPY WITH PROPOFOL;  Surgeon: Lollie Sails, MD;  Location: [facility] ENDOSCOPY;  Service: Endoscopy;  Laterality: N/A;  . ESOPHAGOGASTRODUODENOSCOPY N/A 04/08/2015   Procedure: ESOPHAGOGASTRODUODENOSCOPY (EGD);  Surgeon: Lollie Sails, MD;  Location: [facility] ENDOSCOPY;  Service: Endoscopy;  Laterality: N/A;  . FEMORAL-POPLITEAL BYPASS GRAFT Bilateral 1997, 2000  . leg stent     pvd   Family History  Problem Relation Age of Onset  . Breast cancer Sister   . Hypertension Sister   . Hypertension Brother   . Breast cancer Daughter    Social History   Socioeconomic History  . Marital status: Legally Separated    Spouse name: Not on file  . Number of children: Not on file  . Years of education: Not on file  . Highest education level: Not on file  Occupational History  . Not on file  Tobacco Use  . Smoking status: Former Research scientist (life sciences)  . Smokeless tobacco: Never Used  Vaping Use  . Vaping Use: Never used  Substance and Sexual Activity  . Alcohol use: Yes    Alcohol/week: 0.0 standard drinks  . Drug use: No  . Sexual activity: Not on file  Other Topics Concern  . Not on file  Social History Narrative  . Not on file   Social Determinants of Health  Financial Resource Strain:   . Difficulty of Paying Living Expenses:   Food Insecurity:   . Worried About Charity fundraiser in the Last Year:   . Arboriculturist in the Last Year:   Transportation Needs:   . Film/video editor (Medical):   Marland Kitchen Lack of Transportation (Non-Medical):   Physical Activity:   . Days of Exercise per Week:   . Minutes of Exercise per Session:   Stress:   . Feeling of Stress :   Social Connections:   . Frequency of Communication with Friends and Family:   . Frequency of Social Gatherings with Friends and Family:   . Attends Religious  Services:   . Active Member of Clubs or Organizations:   . Attends Archivist Meetings:   Marland Kitchen Marital Status:     Tobacco Counseling Counseling given: Not Answered   Clinical Intake:  Pre-visit preparation completed: Yes        Diabetes: No  How often do you need to have someone help you when you read instructions, pamphlets, or other written materials from your doctor or pharmacy?: 1 - Never      Activities of Daily Living In your present state of health, do you have any difficulty performing the following activities: 03/31/2020  Hearing? N  Vision? N  Difficulty concentrating or making decisions? N  Walking or climbing stairs? N  Dressing or bathing? N  Doing errands, shopping? N  Preparing Food and eating ? N  Using the Toilet? N  In the past six months, have you accidently leaked urine? N  Do you have problems with loss of bowel control? N  Managing your Medications? N  Managing your Finances? N  Housekeeping or managing your Housekeeping? N  Some recent data might be hidden    Patient Care Team: Einar Pheasant, MD as PCP - General (Internal Medicine)  Indicate any recent Medical Services you may have received from other than [facility] providers in the past year (date may be approximate).     Assessment:   This is a routine wellness examination for Abdulla.  I connected with Matther today by telephone and verified that I am speaking with the correct person using two identifiers. Location patient: home Location provider: work Persons participating in the virtual visit: patient, Marine scientist.    I discussed the limitations, risks, security and privacy concerns of performing an evaluation and management service by telephone and the availability of in person appointments. The patient expressed understanding and verbally consented to this telephonic visit.    Interactive audio and video telecommunications were attempted between this provider and patient, however  failed, due to patient having technical difficulties OR patient did not have access to video capability.  We continued and completed visit with audio only.  Some vital signs may be absent or patient reported.   Hearing/Vision screen  Hearing Screening   125Hz  250Hz  500Hz  1000Hz  2000Hz  3000Hz  4000Hz  6000Hz  8000Hz   Right ear:           Left ear:           Comments: Patient is able to hear conversational tones without difficulty.  No issues reported.   Vision Screening Comments: Followed by [facility] Wears corrective lenses Visual acuity not assessed, virtual visit.  They have seen their ophthalmologist in the last 12 months.     Dietary issues and exercise activities discussed: Healthy diet Good water intake Current Exercise Habits: The patient does not participate in  regular exercise at present  Goals      Patient Stated   .  Follow up with Primary Care Provider (pt-stated)      As needed Lose a little weight Low cholesterol diet Stay active    .  Increase activity (pt-stated)      Join the gym and use the stationary bike 2-3 days weekly      Depression Screen PHQ 2/9 Scores 03/31/2020 03/30/2019 03/27/2018 08/22/2017 08/22/2016 11/19/2014  PHQ - 2 Score 0 0 0 0 0 0    Fall Risk Fall Risk  03/31/2020 03/30/2019 03/27/2018 08/22/2017 08/22/2016  Falls in the past year? 0 0 No No No  Number falls in past yr: 0 - - - -  Follow up Falls evaluation completed - - - -    Handrails in use when climbing stairs? Yes  Home free of loose throw rugs in walkways, pet beds, electrical cords, etc? Yes Adequate lighting in your home to reduce risk of falls? Yes   ASSISTIVE DEVICES UTILIZED TO PREVENT FALLS:  Life alert? No  Use of a cane, walker or w/c? No  Grab bars in the bathroom? No  Shower chair or bench in shower? No  Elevated toilet seat or a handicapped toilet? No   TIMED UP AND GO:  Was the test performed? No, virtual visit   Cognitive Function: MMSE - Mini  Mental State Exam 03/27/2018  Orientation to time 5  Orientation to Place 5  Registration 3  Attention/ Calculation 5  Recall 3  Language- name 2 objects 2  Language- repeat 1  Language- follow 3 step command 3  Language- read & follow direction 1  Write a sentence 1  Copy design 1  Total score 30     6CIT Screen 03/31/2020 03/30/2019  What Year? 0 points 0 points  What month? 0 points 0 points  What time? - 0 points  Count back from 20 - 0 points  Months in reverse 0 points 0 points  Repeat phrase 0 points 0 points  Total Score - 0    Immunizations Immunization History  Administered Date(s) Administered  . Fluad Quad(high Dose 65+) 08/03/2019  . Influenza Split 06/29/2013, 09/21/2014  . Influenza, High Dose Seasonal PF 08/22/2016, 08/22/2017, 09/02/2018  . Influenza-Unspecified 07/03/2015  . Moderna SARS-COVID-2 Vaccination 11/19/2019, 12/21/2019  . Pneumococcal Conjugate-13 04/12/2017  . Pneumococcal Polysaccharide-23 12/03/2018  . Tdap 04/02/2018  . Zoster 11/02/2014   Health Maintenance There are no preventive care reminders to display for this patient. Health Maintenance  Topic Date Due  . COLONOSCOPY  04/07/2020  . INFLUENZA VACCINE  05/08/2020  . TETANUS/TDAP  04/02/2028  . COVID-19 Vaccine  Completed  . Hepatitis C Screening  Completed  . PNA vac Low Risk Adult  Completed   Dental Screening: Recommended annual dental exams for proper oral hygiene  Community Resource Referral / Chronic Care Management: CRR required this visit?  No  CCM required this visit?  No    Plan:    Keep all routine maintenance appointments.   Follow up 06/21/20 @ 11:30   I have personally reviewed and noted the following in the patient's chart:   . Medical and social history . Use of alcohol, tobacco or illicit drugs  . Current medications and supplements . Functional ability and status . Nutritional status . Physical activity . Advanced directives . List of other  physicians . Hospitalizations, surgeries, and ER visits in previous 12 months . Vitals . Screenings to include  cognitive, depression, and falls . Referrals and appointments  In addition, I have reviewed and discussed with patient certain preventive protocols, quality metrics, and best practice recommendations. A written personalized care plan for preventive services as well as general preventive health recommendations were provided to patient via mail.     Varney Biles, LPN   7/74/1287   Reviewed above information.  Agree with assessment and plan.   Dr Nicki Reaper

## 2020-03-31 NOTE — Patient Instructions (Addendum)
[name] , Thank you for taking time to come for your Medicare Wellness Visit. I appreciate your ongoing commitment to your health goals. Please review the following plan we discussed and let me know if I can assist you in the future.   These are the goals we discussed: Goals      Patient Stated     Follow up with Primary Care Provider (pt-stated)      As needed Lose a little weight Low cholesterol diet Stay active      Increase activity (pt-stated)      Join the gym and use the stationary bike 2-3 days weekly       This is a list of the screening recommended for you and due dates:  Health Maintenance  Topic Date Due   Colon Cancer Screening  04/07/2020   Flu Shot  05/08/2020   Tetanus Vaccine  04/02/2028   COVID-19 Vaccine  Completed    Hepatitis C: One time screening is recommended by Center for Disease Control  (CDC) for  adults born from [birth year] through 1965.   Completed   Pneumonia vaccines  Completed   Immunizations Immunization History  Administered Date(s) Administered   Fluad Quad(high Dose 65+) 08/03/2019   Influenza Split 06/29/2013, 09/21/2014   Influenza, High Dose Seasonal PF 08/22/2016, 08/22/2017, 09/02/2018   Influenza-Unspecified 07/03/2015   Moderna SARS-COVID-2 Vaccination 11/19/2019, 12/21/2019   Pneumococcal Conjugate-13 04/12/2017   Pneumococcal Polysaccharide-23 12/03/2018   Tdap 04/02/2018   Zoster 11/02/2014   Follow up 06/21/20 @ 11:30  Advanced directives: declined  Conditions/risks identified: none new  Follow up in one year for your annual wellness visit.   Preventive Care 35 Years and Older, Male Preventive care refers to lifestyle choices and visits with your health care provider that can promote health and wellness. What does preventive care include?  A yearly physical exam. This is also called an annual well check.  Dental exams once or twice a year.  Routine eye exams. Ask your health care provider how often  you should have your eyes checked.  Personal lifestyle choices, including:  Daily care of your teeth and gums.  Regular physical activity.  Eating a healthy diet.  Avoiding tobacco and drug use.  Limiting alcohol use.  Practicing safe sex.  Taking low doses of aspirin every day.  Taking vitamin and mineral supplements as recommended by your health care provider. What happens during an annual well check? The services and screenings done by your health care provider during your annual well check will depend on your age, overall health, lifestyle risk factors, and family history of disease. Counseling  Your health care provider may ask you questions about your:  Alcohol use.  Tobacco use.  Drug use.  Emotional well-being.  Home and relationship well-being.  Sexual activity.  Eating habits.  History of falls.  Memory and ability to understand (cognition).  Work and work Statistician. Screening  You may have the following tests or measurements:  Height, weight, and BMI.  Blood pressure.  Lipid and cholesterol levels. These may be checked every 5 years, or more frequently if you are over 28 years old.  Skin check.  Lung cancer screening. You may have this screening every year starting at age 80 if you have a 30-pack-year history of smoking and currently smoke or have quit within the past 15 years.  Fecal occult blood test (FOBT) of the stool. You may have this test every year starting at age 36.  Flexible sigmoidoscopy  or colonoscopy. You may have a sigmoidoscopy every 5 years or a colonoscopy every 10 years starting at age 76.  Prostate cancer screening. Recommendations will vary depending on your family history and other risks.  Hepatitis C blood test.  Hepatitis B blood test.  Sexually transmitted disease (STD) testing.  Diabetes screening. This is done by checking your blood sugar (glucose) after you have not eaten for a while (fasting). You may have  this done every 1-3 years.  Abdominal aortic aneurysm (AAA) screening. You may need this if you are a current or former smoker.  Osteoporosis. You may be screened starting at age 55 if you are at high risk. Talk with your health care provider about your test results, treatment options, and if necessary, the need for more tests. Vaccines  Your health care provider may recommend certain vaccines, such as:  Influenza vaccine. This is recommended every year.  Tetanus, diphtheria, and acellular pertussis (Tdap, Td) vaccine. You may need a Td booster every 10 years.  Zoster vaccine. You may need this after age 33.  Pneumococcal 13-valent conjugate (PCV13) vaccine. One dose is recommended after age 74.  Pneumococcal polysaccharide (PPSV23) vaccine. One dose is recommended after age 9. Talk to your health care provider about which screenings and vaccines you need and how often you need them. This information is not intended to replace advice given to you by your health care provider. Make sure you discuss any questions you have with your health care provider. Document Released: 10/21/2015 Document Revised: 06/13/2016 Document Reviewed: 07/26/2015 Elsevier Interactive Patient Education  2017 [place] Prevention in the Home Falls can cause injuries. They can happen to people of all ages. There are many things you can do to make your home safe and to help prevent falls. What can I do on the outside of my home?  Regularly fix the edges of walkways and driveways and fix any cracks.  Remove anything that might make you trip as you walk through a door, such as a raised step or threshold.  Trim any bushes or trees on the path to your home.  Use bright outdoor lighting.  Clear any walking paths of anything that might make someone trip, such as rocks or tools.  Regularly check to see if handrails are loose or broken. Make sure that both sides of any steps have handrails.  Any raised  decks and porches should have guardrails on the edges.  Have any leaves, snow, or ice cleared regularly.  Use sand or salt on walking paths during winter.  Clean up any spills in your garage right away. This includes oil or grease spills. What can I do in the bathroom?  Use night lights.  Install grab bars by the toilet and in the tub and shower. Do not use towel bars as grab bars.  Use non-skid mats or decals in the tub or shower.  If you need to sit down in the shower, use a plastic, non-slip stool.  Keep the floor dry. Clean up any water that spills on the floor as soon as it happens.  Remove soap buildup in the tub or shower regularly.  Attach bath mats securely with double-sided non-slip rug tape.  Do not have throw rugs and other things on the floor that can make you trip. What can I do in the bedroom?  Use night lights.  Make sure that you have a light by your bed that is easy to reach.  Do not use  any sheets or blankets that are too big for your bed. They should not hang down onto the floor.  Have a firm chair that has side arms. You can use this for support while you get dressed.  Do not have throw rugs and other things on the floor that can make you trip. What can I do in the kitchen?  Clean up any spills right away.  Avoid walking on wet floors.  Keep items that you use a lot in easy-to-reach places.  If you need to reach something above you, use a strong step stool that has a grab bar.  Keep electrical cords out of the way.  Do not use floor polish or wax that makes floors slippery. If you must use wax, use non-skid floor wax.  Do not have throw rugs and other things on the floor that can make you trip. What can I do with my stairs?  Do not leave any items on the stairs.  Make sure that there are handrails on both sides of the stairs and use them. Fix handrails that are broken or loose. Make sure that handrails are as long as the stairways.  Check  any carpeting to make sure that it is firmly attached to the stairs. Fix any carpet that is loose or worn.  Avoid having throw rugs at the top or bottom of the stairs. If you do have throw rugs, attach them to the floor with carpet tape.  Make sure that you have a light switch at the top of the stairs and the bottom of the stairs. If you do not have them, ask someone to add them for you. What else can I do to help prevent falls?  Wear shoes that:  Do not have high heels.  Have rubber bottoms.  Are comfortable and fit you well.  Are closed at the toe. Do not wear sandals.  If you use a stepladder:  Make sure that it is fully opened. Do not climb a closed stepladder.  Make sure that both sides of the stepladder are locked into place.  Ask someone to hold it for you, if possible.  Clearly mark and make sure that you can see:  Any grab bars or handrails.  First and last steps.  Where the edge of each step is.  Use tools that help you move around (mobility aids) if they are needed. These include:  Canes.  Walkers.  Scooters.  Crutches.  Turn on the lights when you go into a dark area. Replace any light bulbs as soon as they burn out.  Set up your furniture so you have a clear path. Avoid moving your furniture around.  If any of your floors are uneven, fix them.  If there are any pets around you, be aware of where they are.  Review your medicines with your doctor. Some medicines can make you feel dizzy. This can increase your chance of falling. Ask your doctor what other things that you can do to help prevent falls. This information is not intended to replace advice given to you by your health care provider. Make sure you discuss any questions you have with your health care provider. Document Released: 07/21/2009 Document Revised: 03/01/2016 Document Reviewed: 10/29/2014 Elsevier Interactive Patient Education  2017 [employer].

## 2020-04-06 ENCOUNTER — Encounter (INDEPENDENT_AMBULATORY_CARE_PROVIDER_SITE_OTHER): Payer: Medicare Other

## 2020-04-06 ENCOUNTER — Ambulatory Visit (INDEPENDENT_AMBULATORY_CARE_PROVIDER_SITE_OTHER): Payer: Medicare Other | Admitting: Nurse Practitioner

## 2020-05-05 ENCOUNTER — Ambulatory Visit (INDEPENDENT_AMBULATORY_CARE_PROVIDER_SITE_OTHER): Payer: Medicare Other

## 2020-05-05 ENCOUNTER — Other Ambulatory Visit: Payer: Self-pay

## 2020-05-05 ENCOUNTER — Other Ambulatory Visit (INDEPENDENT_AMBULATORY_CARE_PROVIDER_SITE_OTHER): Payer: Self-pay | Admitting: Nurse Practitioner

## 2020-05-05 ENCOUNTER — Encounter (INDEPENDENT_AMBULATORY_CARE_PROVIDER_SITE_OTHER): Payer: Self-pay | Admitting: Vascular Surgery

## 2020-05-05 ENCOUNTER — Ambulatory Visit (INDEPENDENT_AMBULATORY_CARE_PROVIDER_SITE_OTHER): Payer: Medicare Other | Admitting: Vascular Surgery

## 2020-05-05 VITALS — BP 167/82 | HR 94 | Resp 18 | Ht 75.0 in | Wt 310.0 lb

## 2020-05-05 DIAGNOSIS — I70213 Atherosclerosis of native arteries of extremities with intermittent claudication, bilateral legs: Secondary | ICD-10-CM

## 2020-05-05 DIAGNOSIS — I739 Peripheral vascular disease, unspecified: Secondary | ICD-10-CM | POA: Diagnosis not present

## 2020-05-05 DIAGNOSIS — I25118 Atherosclerotic heart disease of native coronary artery with other forms of angina pectoris: Secondary | ICD-10-CM | POA: Diagnosis not present

## 2020-05-05 DIAGNOSIS — I10 Essential (primary) hypertension: Secondary | ICD-10-CM | POA: Diagnosis not present

## 2020-05-05 DIAGNOSIS — E7800 Pure hypercholesterolemia, unspecified: Secondary | ICD-10-CM

## 2020-05-05 DIAGNOSIS — I872 Venous insufficiency (chronic) (peripheral): Secondary | ICD-10-CM | POA: Diagnosis not present

## 2020-05-06 ENCOUNTER — Encounter (INDEPENDENT_AMBULATORY_CARE_PROVIDER_SITE_OTHER): Payer: Self-pay | Admitting: Vascular Surgery

## 2020-05-06 DIAGNOSIS — I70219 Atherosclerosis of native arteries of extremities with intermittent claudication, unspecified extremity: Secondary | ICD-10-CM | POA: Insufficient documentation

## 2020-05-06 NOTE — Progress Notes (Signed)
MRN : [id number]  Charles Nichols is a 69 y.o. (28-Apr-1951) male who presents with chief complaint of  Chief Complaint  Patient presents with  . Follow-up    ultrasound  .  History of Present Illness:   The patient returns to the office for followup and review of the noninvasive studies. There have been no interval changes in lower extremity symptoms. No interval shortening of the patient's claudication distance or development of rest pain symptoms. No new ulcers or wounds have occurred since the last visit.  There have been no significant changes to the patient's overall health care.  The patient denies amaurosis fugax or recent TIA symptoms. There are no recent neurological changes noted. The patient denies history of DVT, PE or superficial thrombophlebitis. The patient denies recent episodes of angina or shortness of breath.   ABI Rt=0.72 and Lt=0.62  (previous ABI's Rt=0.68 and Lt=0.52) Duplex ultrasound of the bilateral lower extremity shows bilateral SFA/pop occlusions  Current Meds  Medication Sig  . acetaminophen (TYLENOL) 325 MG tablet Take 650 mg by mouth every 6 (six) hours as needed for pain.  Marland Kitchen aspirin 81 MG tablet Take 81 mg by mouth daily.  . chlorthalidone (HYGROTON) 25 MG tablet Take 1 tablet (25 mg total) by mouth daily.  . Cholecalciferol (VITAMIN D3) 2000 UNITS TABS Take by mouth daily.  . felodipine (PLENDIL) 10 MG 24 hr tablet Take 1 tablet by mouth once daily  . lisinopril (ZESTRIL) 20 MG tablet Take 2 tablets (40 mg total) by mouth daily.  Marland Kitchen lovastatin (MEVACOR) 40 MG tablet Take 1 tablet (40 mg total) by mouth daily.  Marland Kitchen triamcinolone cream (KENALOG) 0.1 % APPLY TO ITCHY AREAS ON ARMS AND SCALP DAILY  . vitamin B-12 (CYANOCOBALAMIN) 1000 MCG tablet Take 1,000 mcg by mouth daily.    Past Medical History:  Diagnosis Date  . Arthritis   . History of chicken pox   . Hypertension   . Obesity   . Peripheral vascular disease ([place])   . Peripheral vascular  disease [facility])     Past Surgical History:  Procedure Laterality Date  . CIRCUMCISION    . COLONOSCOPY WITH PROPOFOL N/A 04/08/2015   Procedure: COLONOSCOPY WITH PROPOFOL;  Surgeon: Lollie Sails, MD;  Location: [facility] ENDOSCOPY;  Service: Endoscopy;  Laterality: N/A;  . ESOPHAGOGASTRODUODENOSCOPY N/A 04/08/2015   Procedure: ESOPHAGOGASTRODUODENOSCOPY (EGD);  Surgeon: Lollie Sails, MD;  Location: [facility] ENDOSCOPY;  Service: Endoscopy;  Laterality: N/A;  . FEMORAL-POPLITEAL BYPASS GRAFT Bilateral 1997, 2000  . leg stent     pvd    Social History Social History   Tobacco Use  . Smoking status: Former Research scientist (life sciences)  . Smokeless tobacco: Never Used  Vaping Use  . Vaping Use: Never used  Substance Use Topics  . Alcohol use: Yes    Alcohol/week: 0.0 standard drinks  . Drug use: No    Family History Family History  Problem Relation Age of Onset  . Breast cancer Sister   . Hypertension Sister   . Hypertension Brother   . Breast cancer Daughter     No Known Allergies   REVIEW OF SYSTEMS (Negative unless checked)  Constitutional: [] Weight loss  [] Fever  [] Chills Cardiac: [] Chest pain   [] Chest pressure   [] Palpitations   [] Shortness of breath when laying flat   [] Shortness of breath with exertion. Vascular:  [x] Pain in legs with walking   [] Pain in legs at rest  [] History of DVT   [] Phlebitis   [] Swelling  in legs   [] Varicose veins   [] Non-healing ulcers Pulmonary:   [] Uses home oxygen   [] Productive cough   [] Hemoptysis   [] Wheeze  [] COPD   [] Asthma Neurologic:  [] Dizziness   [] Seizures   [] History of stroke   [] History of TIA  [] Aphasia   [] Vissual changes   [] Weakness or numbness in arm   [] Weakness or numbness in leg Musculoskeletal:   [] Joint swelling   [x] Joint pain   [] Low back pain Hematologic:  [] Easy bruising  [] Easy bleeding   [] Hypercoagulable state   [] Anemic Gastrointestinal:  [] Diarrhea   [] Vomiting  [] Gastroesophageal reflux/heartburn   [] Difficulty  swallowing. Genitourinary:  [] Chronic kidney disease   [] Difficult urination  [] Frequent urination   [] Blood in urine Skin:  [] Rashes   [] Ulcers  Psychological:  [] History of anxiety   []  History of major depression.  Physical Examination  Vitals:   05/05/20 1538  BP: (!) 167/82  Pulse: 94  Resp: 18  Weight: (!) 310 lb (140.6 kg)  Height: 6\' 3"  (1.905 m)   Body mass index is 38.75 kg/m. Gen: WD/WN, NAD Head: Winter/AT, No temporalis wasting.  Ear/Nose/Throat: Hearing grossly intact, nares w/o erythema or drainage Eyes: PER, EOMI, sclera nonicteric.  Neck: Supple, no large masses.   Pulmonary:  Good air movement, no audible wheezing bilaterally, no use of accessory muscles.  Cardiac: RRR, no JVD Vascular: scattered varicosities present bilaterally.  Mild venous stasis changes to the legs bilaterally.  2+ soft pitting edema. Vessel Right Left  Radial Palpable Palpable  PT Not Palpable Not Palpable  DP Not Palpable Not Palpable  Gastrointestinal: Non-distended. No guarding/no peritoneal signs.  Musculoskeletal: M/S 5/5 throughout.  No deformity or atrophy.  Neurologic: CN 2-12 intact. Symmetrical.  Speech is fluent. Motor exam as listed above. Psychiatric: Judgment intact, Mood & affect appropriate for pt's clinical situation. Dermatologic: No rashes or ulcers noted.  No changes consistent with cellulitis.  CBC Lab Results  Component Value Date   WBC 7.3 12/16/2019   HGB 12.6 (L) 12/16/2019   HCT 37.4 (L) 12/16/2019   MCV 83.0 12/16/2019   PLT 218.0 12/16/2019    BMET    Component Value Date/Time   NA 135 12/16/2019 0941   NA 141 05/20/2015 0539   K 3.9 12/16/2019 0941   CL 98 12/16/2019 0941   CO2 30 12/16/2019 0941   GLUCOSE 108 (H) 12/16/2019 0941   BUN 12 12/16/2019 0941   BUN 13 05/20/2015 0539   CREATININE 0.89 12/16/2019 0941   CALCIUM 9.7 12/16/2019 0941   CrCl cannot be calculated (Patient's most recent lab result is older than the maximum 21 days  allowed.).  COAG No results found for: INR, PROTIME  Radiology VAS [place] ABI WITH/WO TBI  Result Date: 05/05/2020 LOWER EXTREMITY DOPPLER STUDY Indications: Peripheral artery disease.  Vascular Interventions: Vascular intervention x 20 plus years ago. Performing Technologist: Blondell Reveal RT, RDMS, RVT  Examination Guidelines: A complete evaluation includes at minimum, Doppler waveform signals and systolic blood pressure reading at the level of bilateral brachial, anterior tibial, and posterior tibial arteries, when vessel segments are accessible. Bilateral testing is considered an integral part of a complete examination. Photoelectric Plethysmograph (PPG) waveforms and toe systolic pressure readings are included as required and additional duplex testing as needed. Limited examinations for reoccurring indications may be performed as noted.  ABI Findings: +---------+------------------+-----+-------------------+--------+ Right    Rt Pressure (mmHg)IndexWaveform           Comment  +---------+------------------+-----+-------------------+--------+ Brachial 116                                                +---------+------------------+-----+-------------------+--------+  ATA      91                0.72 monophasic                  +---------+------------------+-----+-------------------+--------+ PTA      58                0.46 dampened monophasic         +---------+------------------+-----+-------------------+--------+ Great Toe36                0.29 Abnormal                    +---------+------------------+-----+-------------------+--------+ +---------+------------------+-----+-------------------+-------+ Left     Lt Pressure (mmHg)IndexWaveform           Comment +---------+------------------+-----+-------------------+-------+ Brachial 126                                               +---------+------------------+-----+-------------------+-------+ ATA      78                 0.62 monophasic                 +---------+------------------+-----+-------------------+-------+ PTA                             absent                     +---------+------------------+-----+-------------------+-------+ PERO     69                0.55 dampened monophasic        +---------+------------------+-----+-------------------+-------+ Great Toe46                0.37 Abnormal                   +---------+------------------+-----+-------------------+-------+ +-------+-----------+-----------+------------+------------+ ABI/TBIToday's ABIToday's TBIPrevious ABIPrevious TBI +-------+-----------+-----------+------------+------------+ Right  0.72       0.29       0.68        0.35         +-------+-----------+-----------+------------+------------+ Left   0.62       0.37       0.52        0.49         +-------+-----------+-----------+------------+------------+  Summary: Right: Resting right ankle-brachial index indicates moderate right lower extremity arterial disease. The right toe-brachial index is abnormal. Left: Resting left ankle-brachial index indicates moderate left lower extremity arterial disease. The left toe-brachial index is abnormal.  *See table(s) above for measurements and observations.  Electronically signed by Hortencia Pilar MD on 05/05/2020 at 5:01:38 PM.   Final    VAS [place] LOWER EXTREMITY ARTERIAL DUPLEX  Result Date: 05/05/2020 LOWER EXTREMITY ARTERIAL DUPLEX STUDY Indications: Peripheral artery disease.  Vascular Interventions: Vascular intervention x 20 plus years ago. Current ABI:            Right=0.72 & Left=0.62 Performing Technologist: Blondell Reveal RT, RDMS, RVT  Examination Guidelines: A complete evaluation includes B-mode imaging, spectral Doppler, color Doppler, and power Doppler as needed of all accessible portions of each vessel. Bilateral testing is considered an integral part of a complete examination. Limited examinations for  reoccurring indications may be performed as noted.  +----------+--------+-----+---------------+----------+--------+ RIGHT     PSV cm/sRatioStenosis  Waveform  Comments +----------+--------+-----+---------------+----------+--------+ CFA Mid   182                         monophasic         +----------+--------+-----+---------------+----------+--------+ DFA       197                         monophasic         +----------+--------+-----+---------------+----------+--------+ SFA Prox  48                          monophasic         +----------+--------+-----+---------------+----------+--------+ SFA Mid   231          50-74% stenosismonophasic         +----------+--------+-----+---------------+----------+--------+ SFA Distal             occluded                          +----------+--------+-----+---------------+----------+--------+ POP Prox  18                          monophasic         +----------+--------+-----+---------------+----------+--------+ POP Distal24                          monophasic         +----------+--------+-----+---------------+----------+--------+ ATA Distal30                          monophasic         +----------+--------+-----+---------------+----------+--------+ PTA Distal21                          monophasic         +----------+--------+-----+---------------+----------+--------+ A right femoral-popliteal BPG was not adequately visualized.  +-----------+--------+-----+--------+----------+--------+ LEFT       PSV cm/sRatioStenosisWaveform  Comments +-----------+--------+-----+--------+----------+--------+ CFA Mid    232                  monophasic         +-----------+--------+-----+--------+----------+--------+ DFA        258                  monophasic         +-----------+--------+-----+--------+----------+--------+ SFA Prox                occluded                    +-----------+--------+-----+--------+----------+--------+ SFA Mid                 occluded                   +-----------+--------+-----+--------+----------+--------+ SFA Distal              occluded                   +-----------+--------+-----+--------+----------+--------+ POP Prox   54                   monophasic         +-----------+--------+-----+--------+----------+--------+ POP Distal 40                   monophasic         +-----------+--------+-----+--------+----------+--------+  ATA Distal 58                   monophasic         +-----------+--------+-----+--------+----------+--------+ PTA Distal              occluded                   +-----------+--------+-----+--------+----------+--------+ PERO Distal12                   monophasic         +-----------+--------+-----+--------+----------+--------+ No flow detected in the visualized segments of the left femoral BPG.  Summary: Right: Total occlusion noted in the superficial femoral artery. Evidence of significant aorto-iliac level occlusive disease based on CFA Doppler waveforms. Left: Total occlusion noted in the superficial femoral artery and visualized bypass graft. Evidence of significant aorto-iliac level occlusive disease based on CFA Doppler waveforms.  See table(s) above for measurements and observations. Electronically signed by Hortencia Pilar MD on 05/05/2020 at 5:01:41 PM.    Final      Assessment/Plan 1. Atherosclerosis of native artery of both lower extremities with intermittent claudication (HCC)  Recommend:  The patient has evidence of atherosclerosis of the lower extremities with claudication.  The patient does not voice lifestyle limiting changes at this point in time.  Noninvasive studies do not suggest clinically significant change.  No invasive studies, angiography or surgery at this time The patient should continue walking and begin a more formal exercise program.  The  patient should continue antiplatelet therapy and aggressive treatment of the lipid abnormalities  No changes in the patient's medications at this time  The patient should continue wearing graduated compression socks 10-15 mmHg strength to control the mild edema.   - VAS [place] ABI WITH/WO TBI; Future  2. Coronary artery disease of native artery of native heart with stable angina pectoris (HCC) Continue cardiac and antihypertensive medications as already ordered and reviewed, no changes at this time.  Continue statin as ordered and reviewed, no changes at this time  Nitrates PRN for chest pain   3. Chronic venous insufficiency No surgery or intervention at this point in time.    I have reviewed my discussion with the patient regarding venous insufficiency and secondary lymph edema and why it  causes symptoms. I have discussed with the patient the chronic skin changes that accompany these problems and the long term sequela such as ulceration and infection.  Patient will continue wearing graduated compression stockings class 1 (20-30 mmHg) on a daily basis a prescription was given to the patient to keep this updated. The patient will  put the stockings on first thing in the morning and removing them in the evening. The patient is instructed specifically not to sleep in the stockings.  In addition, behavioral modification including elevation during the day will be continued.  Diet and salt restriction was also discussed.  Previous duplex ultrasound of the lower extremities shows normal deep venous system, superficial reflux was not present.   Following the review of the ultrasound the patient will follow up in 12 months to reassess the degree of swelling and the control that graduated compression is offering.   The patient can be assessed for a Lymph Pump at that time.  However, at this time the patient states they are satisfied with the control compression and elevation is yielding.    4.  Essential hypertension, benign Continue antihypertensive medications as already ordered, these medications have been reviewed and  there are no changes at this time.   5. Hypercholesterolemia Continue statin as ordered and reviewed, no changes at this time     Hortencia Pilar, MD  05/06/2020 12:14 PM

## 2020-05-19 DIAGNOSIS — D120 Benign neoplasm of cecum: Secondary | ICD-10-CM | POA: Diagnosis not present

## 2020-05-19 DIAGNOSIS — Z86010 Personal history of colonic polyps: Secondary | ICD-10-CM | POA: Diagnosis not present

## 2020-05-19 DIAGNOSIS — Z1211 Encounter for screening for malignant neoplasm of colon: Secondary | ICD-10-CM | POA: Diagnosis not present

## 2020-05-20 DIAGNOSIS — D120 Benign neoplasm of cecum: Secondary | ICD-10-CM | POA: Insufficient documentation

## 2020-05-24 DIAGNOSIS — R269 Unspecified abnormalities of gait and mobility: Secondary | ICD-10-CM | POA: Diagnosis not present

## 2020-05-24 DIAGNOSIS — M21371 Foot drop, right foot: Secondary | ICD-10-CM | POA: Diagnosis not present

## 2020-05-24 DIAGNOSIS — G5602 Carpal tunnel syndrome, left upper limb: Secondary | ICD-10-CM | POA: Diagnosis not present

## 2020-05-24 DIAGNOSIS — G608 Other hereditary and idiopathic neuropathies: Secondary | ICD-10-CM | POA: Diagnosis not present

## 2020-05-30 ENCOUNTER — Telehealth: Payer: Self-pay | Admitting: Internal Medicine

## 2020-05-30 NOTE — Telephone Encounter (Signed)
Patient is having a colonoscopy on 06/06/2020 at 7 am. He would like to know if he should take his medication that morning? Please call him.

## 2020-06-01 NOTE — Telephone Encounter (Signed)
Patient is going to call GI to confirm with them

## 2020-06-01 NOTE — Telephone Encounter (Signed)
Patient is not on any blood thinners or anything, I advised that GI MD should give him instructions on what to do prior to procedure but would also send message over to you to review.

## 2020-06-01 NOTE — Telephone Encounter (Signed)
Yes, GI will notify him of what medications they want him to hold and for how long.  Per note, on aspirin, they may have him stop his aspirin. I would recommend for him to call them and clarify.

## 2020-06-06 DIAGNOSIS — Z1211 Encounter for screening for malignant neoplasm of colon: Secondary | ICD-10-CM | POA: Diagnosis not present

## 2020-06-06 DIAGNOSIS — D123 Benign neoplasm of transverse colon: Secondary | ICD-10-CM | POA: Diagnosis not present

## 2020-06-06 DIAGNOSIS — K635 Polyp of colon: Secondary | ICD-10-CM | POA: Diagnosis not present

## 2020-06-06 DIAGNOSIS — Z86010 Personal history of colonic polyps: Secondary | ICD-10-CM | POA: Diagnosis not present

## 2020-06-06 DIAGNOSIS — Z87891 Personal history of nicotine dependence: Secondary | ICD-10-CM | POA: Diagnosis not present

## 2020-06-06 DIAGNOSIS — D125 Benign neoplasm of sigmoid colon: Secondary | ICD-10-CM | POA: Diagnosis not present

## 2020-06-06 DIAGNOSIS — Z7982 Long term (current) use of aspirin: Secondary | ICD-10-CM | POA: Diagnosis not present

## 2020-06-06 DIAGNOSIS — E785 Hyperlipidemia, unspecified: Secondary | ICD-10-CM | POA: Diagnosis not present

## 2020-06-06 DIAGNOSIS — K5730 Diverticulosis of large intestine without perforation or abscess without bleeding: Secondary | ICD-10-CM | POA: Diagnosis not present

## 2020-06-06 DIAGNOSIS — I10 Essential (primary) hypertension: Secondary | ICD-10-CM | POA: Diagnosis not present

## 2020-06-06 LAB — HM COLONOSCOPY

## 2020-06-21 ENCOUNTER — Ambulatory Visit (INDEPENDENT_AMBULATORY_CARE_PROVIDER_SITE_OTHER): Payer: Medicare Other | Admitting: Internal Medicine

## 2020-06-21 ENCOUNTER — Other Ambulatory Visit: Payer: Self-pay

## 2020-06-21 ENCOUNTER — Encounter: Payer: Self-pay | Admitting: Internal Medicine

## 2020-06-21 VITALS — BP 130/70 | HR 96 | Temp 98.8°F | Ht 75.0 in | Wt 312.0 lb

## 2020-06-21 DIAGNOSIS — R739 Hyperglycemia, unspecified: Secondary | ICD-10-CM | POA: Diagnosis not present

## 2020-06-21 DIAGNOSIS — Z23 Encounter for immunization: Secondary | ICD-10-CM

## 2020-06-21 DIAGNOSIS — I739 Peripheral vascular disease, unspecified: Secondary | ICD-10-CM

## 2020-06-21 DIAGNOSIS — M21371 Foot drop, right foot: Secondary | ICD-10-CM

## 2020-06-21 DIAGNOSIS — I10 Essential (primary) hypertension: Secondary | ICD-10-CM

## 2020-06-21 DIAGNOSIS — D649 Anemia, unspecified: Secondary | ICD-10-CM | POA: Diagnosis not present

## 2020-06-21 DIAGNOSIS — E7800 Pure hypercholesterolemia, unspecified: Secondary | ICD-10-CM

## 2020-06-21 LAB — BASIC METABOLIC PANEL
BUN: 16 mg/dL (ref 6–23)
CO2: 27 mEq/L (ref 19–32)
Calcium: 10.4 mg/dL (ref 8.4–10.5)
Chloride: 100 mEq/L (ref 96–112)
Creatinine, Ser: 1.07 mg/dL (ref 0.40–1.50)
GFR: 82.85 mL/min (ref >60.00)
Glucose, Bld: 101 mg/dL — ABNORMAL HIGH (ref 70–99)
Potassium: 3.7 mEq/L (ref 3.5–5.1)
Sodium: 138 mEq/L (ref 135–145)

## 2020-06-21 LAB — HEPATIC FUNCTION PANEL
ALT: 14 U/L (ref 0–53)
AST: 20 U/L (ref 0–37)
Albumin: 4.1 g/dL (ref 3.5–5.2)
Alkaline Phosphatase: 63 U/L (ref 39–117)
Bilirubin, Direct: 0.1 mg/dL (ref 0.0–0.3)
Total Bilirubin: 0.4 mg/dL (ref 0.2–1.2)
Total Protein: 7.4 g/dL (ref 6.0–8.3)

## 2020-06-21 LAB — CBC WITH DIFFERENTIAL/PLATELET
Basophils Absolute: 0 10*3/uL (ref 0.0–0.1)
Basophils Relative: 0.5 % (ref 0.0–3.0)
Eosinophils Absolute: 0.1 10*3/uL (ref 0.0–0.7)
Eosinophils Relative: 1.7 % (ref 0.0–5.0)
HCT: 37.1 % — ABNORMAL LOW (ref 39.0–52.0)
Hemoglobin: 12.2 g/dL — ABNORMAL LOW (ref 13.0–17.0)
Lymphocytes Relative: 20.5 % (ref 12.0–46.0)
Lymphs Abs: 1.4 10*3/uL (ref 0.7–4.0)
MCHC: 33 g/dL (ref 30.0–36.0)
MCV: 83 fl (ref 78.0–100.0)
Monocytes Absolute: 0.7 10*3/uL (ref 0.1–1.0)
Monocytes Relative: 10 % (ref 3.0–12.0)
Neutro Abs: 4.7 10*3/uL (ref 1.4–7.7)
Neutrophils Relative %: 67.3 % (ref 43.0–77.0)
Platelets: 218 10*3/uL (ref 150.0–400.0)
RBC: 4.47 Mil/uL (ref 4.22–5.81)
RDW: 14.2 % (ref 11.5–15.5)
WBC: 6.9 10*3/uL (ref 4.0–10.5)

## 2020-06-21 LAB — LIPID PANEL
Cholesterol: 135 mg/dL (ref 0–200)
HDL: 37.2 mg/dL — ABNORMAL LOW (ref >39.00)
NonHDL: 97.74
Total CHOL/HDL Ratio: 4
Triglycerides: 215 mg/dL — ABNORMAL HIGH (ref 0.0–149.0)
VLDL: 43 mg/dL — ABNORMAL HIGH (ref 0.0–40.0)

## 2020-06-21 LAB — IBC + FERRITIN
Ferritin: 629.8 ng/mL — ABNORMAL HIGH (ref 22.0–322.0)
Iron: 77 ug/dL (ref 42–165)
Saturation Ratios: 21.4 % (ref 20.0–50.0)
Transferrin: 257 mg/dL (ref 212.0–360.0)

## 2020-06-21 LAB — HEMOGLOBIN A1C: Hgb A1c MFr Bld: 6.1 % (ref 4.6–6.5)

## 2020-06-21 LAB — LDL CHOLESTEROL, DIRECT: Direct LDL: 74 mg/dL

## 2020-06-21 NOTE — Progress Notes (Signed)
Patient ID: Charles Nichols, male   DOB: 04-08-1951, 69 y.o.   MRN: [id number]   Subjective:    Patient ID: Charles Nichols, male    DOB: [birth year], 69 y.o.   MRN: [id number]  HPI This visit occurred during the SARS-CoV-2 public health emergency.  Safety protocols were in place, including screening questions prior to the visit, additional usage of staff PPE, and extensive cleaning of exam room while observing appropriate contact time as indicated for disinfecting solutions.  Patient here for a scheduled follow up.  He reports he is doing relatively well.  Trying to stay active.  No chest pain or sob.  No acid reflux. No abdominal pain.  Bowels moving.  No leg pain with walking.  Blood pressure ok.    Past Medical History:  Diagnosis Date  . Arthritis   . History of chicken pox   . Hypertension   . Obesity   . Peripheral vascular disease ([place])   . Peripheral vascular disease [facility])    Past Surgical History:  Procedure Laterality Date  . CIRCUMCISION    . COLONOSCOPY WITH PROPOFOL N/A 04/08/2015   Procedure: COLONOSCOPY WITH PROPOFOL;  Surgeon: Lollie Sails, MD;  Location: [facility] ENDOSCOPY;  Service: Endoscopy;  Laterality: N/A;  . ESOPHAGOGASTRODUODENOSCOPY N/A 04/08/2015   Procedure: ESOPHAGOGASTRODUODENOSCOPY (EGD);  Surgeon: Lollie Sails, MD;  Location: [facility] ENDOSCOPY;  Service: Endoscopy;  Laterality: N/A;  . FEMORAL-POPLITEAL BYPASS GRAFT Bilateral 1997, 2000  . leg stent     pvd   Family History  Problem Relation Age of Onset  . Breast cancer Sister   . Hypertension Sister   . Hypertension Brother   . Breast cancer Daughter    Social History   Socioeconomic History  . Marital status: Legally Separated    Spouse name: Not on file  . Number of children: Not on file  . Years of education: Not on file  . Highest education level: Not on file  Occupational History  . Not on file  Tobacco Use  . Smoking status: Former Research scientist (life sciences)  . Smokeless tobacco: Never Used  Vaping Use  .  Vaping Use: Never used  Substance and Sexual Activity  . Alcohol use: Yes    Alcohol/week: 0.0 standard drinks  . Drug use: No  . Sexual activity: Not on file  Other Topics Concern  . Not on file  Social History Narrative  . Not on file   Social Determinants of Health   Financial Resource Strain:   . Difficulty of Paying Living Expenses: Not on file  Food Insecurity:   . Worried About Charity fundraiser in the Last Year: Not on file  . Ran Out of Food in the Last Year: Not on file  Transportation Needs:   . Lack of Transportation (Medical): Not on file  . Lack of Transportation (Non-Medical): Not on file  Physical Activity:   . Days of Exercise per Week: Not on file  . Minutes of Exercise per Session: Not on file  Stress:   . Feeling of Stress : Not on file  Social Connections:   . Frequency of Communication with Friends and Family: Not on file  . Frequency of Social Gatherings with Friends and Family: Not on file  . Attends Religious Services: Not on file  . Active Member of Clubs or Organizations: Not on file  . Attends Archivist Meetings: Not on file  . Marital Status: Not on file    Outpatient Encounter Medications  as of 06/21/2020  Medication Sig  . acetaminophen (TYLENOL) 325 MG tablet Take 650 mg by mouth every 6 (six) hours as needed for pain.  Marland Kitchen aspirin 81 MG tablet Take 81 mg by mouth daily.  . chlorthalidone (HYGROTON) 25 MG tablet Take 1 tablet (25 mg total) by mouth daily.  . Cholecalciferol (VITAMIN D3) 2000 UNITS TABS Take by mouth daily.  . clobetasol cream (TEMOVATE) 5.32 % Apply 1 application topically 2 (two) times daily.  . felodipine (PLENDIL) 10 MG 24 hr tablet Take 1 tablet by mouth once daily  . lisinopril (ZESTRIL) 20 MG tablet Take 2 tablets (40 mg total) by mouth daily.  Marland Kitchen lovastatin (MEVACOR) 40 MG tablet Take 1 tablet (40 mg total) by mouth daily.  Marland Kitchen triamcinolone cream (KENALOG) 0.1 % APPLY TO ITCHY AREAS ON ARMS AND SCALP DAILY    . vitamin B-12 (CYANOCOBALAMIN) 1000 MCG tablet Take 1,000 mcg by mouth daily.   No facility-administered encounter medications on file as of 06/21/2020.    Review of Systems  Constitutional: Negative for appetite change and unexpected weight change.  HENT: Negative for congestion and sinus pressure.   Respiratory: Negative for cough, chest tightness and shortness of breath.   Cardiovascular: Negative for chest pain, palpitations and leg swelling.  Gastrointestinal: Negative for abdominal pain, diarrhea, nausea and vomiting.  Genitourinary: Negative for difficulty urinating and dysuria.  Musculoskeletal: Negative for joint swelling and myalgias.  Skin: Negative for color change and rash.  Neurological: Negative for dizziness, light-headedness and headaches.  Psychiatric/Behavioral: Negative for agitation and dysphoric mood.       Objective:    Physical Exam Vitals reviewed.  Constitutional:      General: He is not in acute distress.    Appearance: Normal appearance. He is well-developed.  HENT:     Head: Normocephalic and atraumatic.     Right Ear: External ear normal.     Left Ear: External ear normal.  Eyes:     General: No scleral icterus.       Right eye: No discharge.        Left eye: No discharge.     Conjunctiva/sclera: Conjunctivae normal.  Cardiovascular:     Rate and Rhythm: Normal rate and regular rhythm.  Pulmonary:     Effort: Pulmonary effort is normal. No respiratory distress.     Breath sounds: Normal breath sounds.  Abdominal:     General: Bowel sounds are normal.     Palpations: Abdomen is soft.     Tenderness: There is no abdominal tenderness.  Musculoskeletal:        General: No swelling or tenderness.     Cervical back: Neck supple. No tenderness.  Lymphadenopathy:     Cervical: No cervical adenopathy.  Skin:    Findings: No erythema or rash.  Neurological:     Mental Status: He is alert.  Psychiatric:        Mood and Affect: Mood normal.         Behavior: Behavior normal.     BP 130/70 (BP Location: Left Arm, Patient Position: Sitting)   Pulse 96   Temp 98.8 F (37.1 C)   Ht _0  (1.905 m)   Wt (!) 312 lb (141.5 kg)   SpO2 93%   BMI 39.00 kg/m  Wt Readings from Last 3 Encounters:  06/21/20 (!) 312 lb (141.5 kg)  05/05/20 (!) 310 lb (140.6 kg)  03/31/20 (!) 307 lb (139.3 kg)     Lab Results  Component Value Date   WBC 6.9 06/21/2020   HGB 12.2 (L) 06/21/2020   HCT 37.1 (L) 06/21/2020   PLT 218.0 06/21/2020   GLUCOSE 101 (H) 06/21/2020   CHOL 135 06/21/2020   TRIG 215.0 (H) 06/21/2020   HDL 37.20 (L) 06/21/2020   LDLDIRECT 74.0 06/21/2020   LDLCALC 59 12/16/2019   ALT 14 06/21/2020   AST 20 06/21/2020   NA 138 06/21/2020   K 3.7 06/21/2020   CL 100 06/21/2020   CREATININE 1.07 06/21/2020   BUN 16 06/21/2020   CO2 27 06/21/2020   TSH 4.39 12/16/2019   PSA 1.23 12/16/2019   HGBA1C 6.1 06/21/2020       Assessment & Plan:   Problem List Items Addressed This Visit    Right foot drop    Has been followed by neurology.  Stable.        Peripheral vascular disease ([place])    Sees [facility].  Continue risk factor modification.  No pain.  Follow.        Hyperglycemia - Primary    Low carb and exercise.  Follow met b and a1c.        Relevant Orders   Hemoglobin A1c (Completed)   Hypercholesterolemia    On lovastatin.  Low cholesterol diet and exercise.  Follow lipid panel and liver function tests.        Relevant Orders   Hepatic function panel (Completed)   Lipid panel (Completed)   Essential hypertension, benign    Blood pressure as outlined.  Continue lisinopril, chlorthalidone and plendil.  Follow pressures.  Follow metabolic panel.        Relevant Orders   Basic metabolic panel (Completed)   Anemia    Follow cbc.       Relevant Orders   CBC with Differential/Platelet (Completed)   IBC + Ferritin (Completed)    Other Visit Diagnoses    Need for immunization against influenza        Relevant Orders   Flu Vaccine QUAD High Dose(Fluad) (Completed)       Einar Pheasant, MD

## 2020-06-23 DIAGNOSIS — D123 Benign neoplasm of transverse colon: Secondary | ICD-10-CM | POA: Diagnosis not present

## 2020-06-23 DIAGNOSIS — D125 Benign neoplasm of sigmoid colon: Secondary | ICD-10-CM | POA: Diagnosis not present

## 2020-06-23 DIAGNOSIS — D126 Benign neoplasm of colon, unspecified: Secondary | ICD-10-CM | POA: Insufficient documentation

## 2020-06-26 ENCOUNTER — Encounter: Payer: Self-pay | Admitting: Internal Medicine

## 2020-06-26 NOTE — Assessment & Plan Note (Signed)
Low carb and exercise.  Follow met b and a1c.   °

## 2020-06-26 NOTE — Assessment & Plan Note (Signed)
On lovastatin.  Low cholesterol diet and exercise.  Follow lipid panel and liver function tests.   

## 2020-06-26 NOTE — Assessment & Plan Note (Signed)
Follow cbc.  

## 2020-06-26 NOTE — Assessment & Plan Note (Signed)
Has been followed by neurology.  Stable.  

## 2020-06-26 NOTE — Assessment & Plan Note (Signed)
Sees [facility].  Continue risk factor modification.  No pain.  Follow.

## 2020-06-26 NOTE — Assessment & Plan Note (Signed)
Blood pressure as outlined.  Continue lisinopril, chlorthalidone and plendil.  Follow pressures.  Follow metabolic panel.

## 2020-07-01 ENCOUNTER — Other Ambulatory Visit: Payer: Self-pay | Admitting: Internal Medicine

## 2020-07-01 DIAGNOSIS — R944 Abnormal results of kidney function studies: Secondary | ICD-10-CM

## 2020-07-01 NOTE — Progress Notes (Signed)
Order placed for labs.

## 2020-07-18 NOTE — Progress Notes (Signed)
Lm to call back and reschedule Jan. Appt to June if he would like too Ok'd per Dr. Nicki Reaper

## 2020-07-24 ENCOUNTER — Other Ambulatory Visit: Payer: Self-pay | Admitting: Internal Medicine

## 2020-07-26 ENCOUNTER — Other Ambulatory Visit: Payer: Self-pay | Admitting: Internal Medicine

## 2020-07-26 DIAGNOSIS — I10 Essential (primary) hypertension: Secondary | ICD-10-CM

## 2020-10-11 ENCOUNTER — Other Ambulatory Visit: Payer: Self-pay | Admitting: Internal Medicine

## 2020-10-11 DIAGNOSIS — I10 Essential (primary) hypertension: Secondary | ICD-10-CM

## 2020-10-21 ENCOUNTER — Encounter: Payer: Medicare Other | Admitting: Internal Medicine

## 2020-10-31 ENCOUNTER — Encounter (INDEPENDENT_AMBULATORY_CARE_PROVIDER_SITE_OTHER): Payer: Medicare Other

## 2020-11-03 ENCOUNTER — Other Ambulatory Visit (INDEPENDENT_AMBULATORY_CARE_PROVIDER_SITE_OTHER): Payer: Self-pay | Admitting: Vascular Surgery

## 2020-11-03 DIAGNOSIS — I70213 Atherosclerosis of native arteries of extremities with intermittent claudication, bilateral legs: Secondary | ICD-10-CM

## 2020-11-07 ENCOUNTER — Encounter (INDEPENDENT_AMBULATORY_CARE_PROVIDER_SITE_OTHER): Payer: Self-pay | Admitting: Nurse Practitioner

## 2020-11-07 ENCOUNTER — Other Ambulatory Visit: Payer: Self-pay

## 2020-11-07 ENCOUNTER — Ambulatory Visit (INDEPENDENT_AMBULATORY_CARE_PROVIDER_SITE_OTHER): Payer: Medicare Other

## 2020-11-07 ENCOUNTER — Ambulatory Visit (INDEPENDENT_AMBULATORY_CARE_PROVIDER_SITE_OTHER): Payer: Medicare Other | Admitting: Nurse Practitioner

## 2020-11-07 VITALS — BP 156/72 | HR 93 | Resp 16 | Wt 309.0 lb

## 2020-11-07 DIAGNOSIS — I70213 Atherosclerosis of native arteries of extremities with intermittent claudication, bilateral legs: Secondary | ICD-10-CM

## 2020-11-07 DIAGNOSIS — I10 Essential (primary) hypertension: Secondary | ICD-10-CM | POA: Diagnosis not present

## 2020-11-07 DIAGNOSIS — M79672 Pain in left foot: Secondary | ICD-10-CM

## 2020-11-07 DIAGNOSIS — E7800 Pure hypercholesterolemia, unspecified: Secondary | ICD-10-CM | POA: Diagnosis not present

## 2020-11-07 NOTE — Progress Notes (Signed)
Subjective:    Patient ID: Charles Nichols, male    DOB: 1951/03/16, 70 y.o.   MRN: [id number] Chief Complaint  Patient presents with  . Follow-up    6 month ABI    The patient returns to the office for followup and review of the noninvasive studies. There have been no interval changes in lower extremity symptoms. No interval shortening of the patient's claudication distance or development of rest pain symptoms. No new ulcers or wounds have occurred since the last visit.  There have been no significant changes to the patient's overall health care.  The patient denies amaurosis fugax or recent TIA symptoms. There are no recent neurological changes noted. The patient denies history of DVT, PE or superficial thrombophlebitis. The patient denies recent episodes of angina or shortness of breath.   ABI Rt=0.64 and Lt=0.97  (previous ABI's Rt=0.72 and Lt=0.62) Duplex ultrasound of the right lower extremity reveals monophasic waveforms in the tibial arteries with biphasic/monophasic waveforms in the left tibial arteries.  Patient has dampened right toe waveforms with good toe waveforms on the left.  It is noted that the ABIs of the left lower extremity are unreliable.   Review of Systems  Cardiovascular: Positive for leg swelling.  Musculoskeletal: Positive for arthralgias.  All other systems reviewed and are negative.      Objective:   Physical Exam Vitals reviewed.  Cardiovascular:     Rate and Rhythm: Normal rate and regular rhythm.     Pulses: Normal pulses.  Pulmonary:     Effort: Pulmonary effort is normal.  Musculoskeletal:        General: Normal range of motion.  Skin:    General: Skin is warm and dry.  Neurological:     Mental Status: He is alert and oriented to person, place, and time.  Psychiatric:        Mood and Affect: Mood normal.        Behavior: Behavior normal.        Thought Content: Thought content normal.        Judgment: Judgment normal.     BP (!)  156/72 (BP Location: Left Arm)   Pulse 93   Resp 16   Wt (!) 309 lb (140.2 kg)   BMI 38.62 kg/m   Past Medical History:  Diagnosis Date  . Arthritis   . History of chicken pox   . Hypertension   . Obesity   . Peripheral vascular disease ([place])   . Peripheral vascular disease ([place])     Social History   Socioeconomic History  . Marital status: Legally Separated    Spouse name: Not on file  . Number of children: Not on file  . Years of education: Not on file  . Highest education level: Not on file  Occupational History  . Not on file  Tobacco Use  . Smoking status: Former Research scientist (life sciences)  . Smokeless tobacco: Never Used  Vaping Use  . Vaping Use: Never used  Substance and Sexual Activity  . Alcohol use: Yes    Alcohol/week: 0.0 standard drinks  . Drug use: No  . Sexual activity: Not on file  Other Topics Concern  . Not on file  Social History Narrative  . Not on file   Social Determinants of Health   Financial Resource Strain: Not on file  Food Insecurity: Not on file  Transportation Needs: Not on file  Physical Activity: Not on file  Stress: Not on file  Social Connections: Not  on file  Intimate Partner Violence: Not on file    Past Surgical History:  Procedure Laterality Date  . CIRCUMCISION    . COLONOSCOPY WITH PROPOFOL N/A 04/08/2015   Procedure: COLONOSCOPY WITH PROPOFOL;  Surgeon: Lollie Sails, MD;  Location: [facility] ENDOSCOPY;  Service: Endoscopy;  Laterality: N/A;  . ESOPHAGOGASTRODUODENOSCOPY N/A 04/08/2015   Procedure: ESOPHAGOGASTRODUODENOSCOPY (EGD);  Surgeon: Lollie Sails, MD;  Location: [facility] ENDOSCOPY;  Service: Endoscopy;  Laterality: N/A;  . FEMORAL-POPLITEAL BYPASS GRAFT Bilateral 1997, 2000  . leg stent     pvd    Family History  Problem Relation Age of Onset  . Breast cancer Sister   . Hypertension Sister   . Hypertension Brother   . Breast cancer Daughter     No Known Allergies  CBC Latest Ref Rng & Units 06/21/2020 12/16/2019  08/03/2019  WBC 4.0 - 10.5 K/uL 6.9 7.3 5.9  Hemoglobin 13.0 - 17.0 g/dL 12.2(L) 12.6(L) 12.9(L)  Hematocrit 39.0 - 52.0 % 37.1(L) 37.4(L) 39.0  Platelets 150.0 - 400.0 K/uL 218.0 218.0 196.0      CMP     Component Value Date/Time   NA 138 06/21/2020 1246   NA 141 05/20/2015 0539   K 3.7 06/21/2020 1246   CL 100 06/21/2020 1246   CO2 27 06/21/2020 1246   GLUCOSE 101 (H) 06/21/2020 1246   BUN 16 06/21/2020 1246   BUN 13 05/20/2015 0539   CREATININE 1.07 06/21/2020 1246   CALCIUM 10.4 06/21/2020 1246   PROT 7.4 06/21/2020 1246   ALBUMIN 4.1 06/21/2020 1246   AST 20 06/21/2020 1246   ALT 14 06/21/2020 1246   ALKPHOS 63 06/21/2020 1246   BILITOT 0.4 06/21/2020 1246     No results found.     Assessment & Plan:   1. Atherosclerosis of native arteries of extremities with intermittent claudication, bilateral legs (HCC)  Recommend:  The patient has evidence of atherosclerosis of the lower extremities with claudication.  The patient does not voice lifestyle limiting changes at this point in time.  Noninvasive studies do not suggest clinically significant change.  No invasive studies, angiography or surgery at this time The patient should continue walking and begin a more formal exercise program.  The patient should continue antiplatelet therapy and aggressive treatment of the lipid abnormalities  No changes in the patient's medications at this time  The patient should continue wearing graduated compression socks 10-15 mmHg strength to control the mild edema.   2. Essential hypertension, benign Continue antihypertensive medications as already ordered, these medications have been reviewed and there are no changes at this time.   3. Hypercholesterolemia Continue statin as ordered and reviewed, no changes at this time   4. Left foot pain The patient's description of pain does not identify with rest pain.  It sounds more in line with the arthritis that the patient  describes having years ago.  The patient notes he has not seen his podiatrist in over 3 years and has not gotten any cortisone shot since then.  It would be prudent to have podiatry evaluate the patient source of left lower extremity pain.  We will place referral back into Dr. Caryl Comes, his previous podiatrist. - Ambulatory referral to Podiatry   Current Outpatient Medications on File Prior to Visit  Medication Sig Dispense Refill  . acetaminophen (TYLENOL) 325 MG tablet Take 650 mg by mouth every 6 (six) hours as needed for pain.    Marland Kitchen aspirin 81 MG tablet Take 81 mg by  mouth daily.    . chlorthalidone (HYGROTON) 25 MG tablet Take 1 tablet by mouth once daily 90 tablet 0  . Cholecalciferol (VITAMIN D3) 2000 UNITS TABS Take by mouth daily.    . clobetasol cream (TEMOVATE) 6.54 % Apply 1 application topically 2 (two) times daily. 30 g 0  . felodipine (PLENDIL) 10 MG 24 hr tablet Take 1 tablet by mouth once daily 90 tablet 0  . lisinopril (ZESTRIL) 20 MG tablet Take 2 tablets by mouth once daily 180 tablet 0  . lovastatin (MEVACOR) 40 MG tablet Take 1 tablet by mouth once daily 90 tablet 0  . triamcinolone cream (KENALOG) 0.1 % APPLY TO ITCHY AREAS ON ARMS AND SCALP DAILY    . vitamin B-12 (CYANOCOBALAMIN) 1000 MCG tablet Take 1,000 mcg by mouth daily.     No current facility-administered medications on file prior to visit.    There are no Patient Instructions on file for this visit. No follow-ups on file.   Kris Hartmann, NP

## 2020-12-05 DIAGNOSIS — M722 Plantar fascial fibromatosis: Secondary | ICD-10-CM | POA: Diagnosis not present

## 2020-12-05 DIAGNOSIS — M79672 Pain in left foot: Secondary | ICD-10-CM | POA: Diagnosis not present

## 2020-12-05 DIAGNOSIS — M19072 Primary osteoarthritis, left ankle and foot: Secondary | ICD-10-CM | POA: Diagnosis not present

## 2020-12-28 DIAGNOSIS — L570 Actinic keratosis: Secondary | ICD-10-CM | POA: Diagnosis not present

## 2020-12-28 DIAGNOSIS — L280 Lichen simplex chronicus: Secondary | ICD-10-CM | POA: Diagnosis not present

## 2021-01-06 DIAGNOSIS — G8929 Other chronic pain: Secondary | ICD-10-CM | POA: Diagnosis not present

## 2021-01-06 DIAGNOSIS — M25561 Pain in right knee: Secondary | ICD-10-CM | POA: Diagnosis not present

## 2021-01-06 DIAGNOSIS — M1711 Unilateral primary osteoarthritis, right knee: Secondary | ICD-10-CM | POA: Diagnosis not present

## 2021-01-14 ENCOUNTER — Other Ambulatory Visit: Payer: Self-pay | Admitting: Internal Medicine

## 2021-01-14 DIAGNOSIS — I10 Essential (primary) hypertension: Secondary | ICD-10-CM

## 2021-01-16 ENCOUNTER — Other Ambulatory Visit: Payer: Self-pay | Admitting: Internal Medicine

## 2021-01-30 DIAGNOSIS — M170 Bilateral primary osteoarthritis of knee: Secondary | ICD-10-CM | POA: Diagnosis not present

## 2021-01-30 DIAGNOSIS — M1712 Unilateral primary osteoarthritis, left knee: Secondary | ICD-10-CM | POA: Diagnosis not present

## 2021-02-23 ENCOUNTER — Encounter: Payer: Self-pay | Admitting: Internal Medicine

## 2021-03-09 DIAGNOSIS — M25579 Pain in unspecified ankle and joints of unspecified foot: Secondary | ICD-10-CM | POA: Diagnosis not present

## 2021-03-09 DIAGNOSIS — M79671 Pain in right foot: Secondary | ICD-10-CM | POA: Diagnosis not present

## 2021-03-09 DIAGNOSIS — M1990 Unspecified osteoarthritis, unspecified site: Secondary | ICD-10-CM | POA: Diagnosis not present

## 2021-03-09 DIAGNOSIS — G5750 Tarsal tunnel syndrome, unspecified lower limb: Secondary | ICD-10-CM | POA: Diagnosis not present

## 2021-03-14 ENCOUNTER — Encounter: Payer: Medicare Other | Admitting: Internal Medicine

## 2021-04-03 ENCOUNTER — Ambulatory Visit (INDEPENDENT_AMBULATORY_CARE_PROVIDER_SITE_OTHER): Payer: Medicare Other

## 2021-04-03 VITALS — Ht 75.0 in | Wt 309.0 lb

## 2021-04-03 DIAGNOSIS — Z0000 Encounter for general adult medical examination without abnormal findings: Secondary | ICD-10-CM

## 2021-04-03 NOTE — Patient Instructions (Addendum)
Charles Nichols , Thank you for taking time to come for your Medicare Wellness Visit. I appreciate your ongoing commitment to your health goals. Please review the following plan we discussed and let me know if I can assist you in the future.   These are the goals we discussed:  Goals       Patient Stated     Follow up with Primary Care Provider (pt-stated)      As needed        Increase activity (pt-stated)      Stay active         This is a list of the screening recommended for you and due dates:  Health Maintenance  Topic Date Due   COVID-19 Vaccine (3 - Booster for Moderna series) 05/22/2020   Zoster (Shingles) Vaccine (1 of 2) 07/04/2021*   Flu Shot  05/08/2021   Colon Cancer Screening  06/06/2025   Tetanus Vaccine  04/02/2028   Hepatitis C Screening: USPSTF Recommendation to screen - Ages 18-79 yo.  Completed   Pneumonia vaccines  Completed   HPV Vaccine  Aged Out  *Topic was postponed. The date shown is not the original due date.    Advanced directives: not yet on file  Conditions/risks identified: none new  Follow up in one year for your annual wellness visit.   Preventive Care 70 Years and Older, Male Preventive care refers to lifestyle choices and visits with your health care provider that can promote health and wellness. What does preventive care include? A yearly physical exam. This is also called an annual well check. Dental exams once or twice a year. Routine eye exams. Ask your health care provider how often you should have your eyes checked. Personal lifestyle choices, including: Daily care of your teeth and gums. Regular physical activity. Eating a healthy diet. Avoiding tobacco and drug use. Limiting alcohol use. Practicing safe sex. Taking low doses of aspirin every day. Taking vitamin and mineral supplements as recommended by your health care provider. What happens during an annual well check? The services and screenings done by your health care  provider during your annual well check will depend on your age, overall health, lifestyle risk factors, and family history of disease. Counseling  Your health care provider may ask you questions about your: Alcohol use. Tobacco use. Drug use. Emotional well-being. Home and relationship well-being. Sexual activity. Eating habits. History of falls. Memory and ability to understand (cognition). Work and work Statistician. Screening  You may have the following tests or measurements: Height, weight, and BMI. Blood pressure. Lipid and cholesterol levels. These may be checked every 5 years, or more frequently if you are over 9 years old. Skin check. Lung cancer screening. You may have this screening every year starting at age 70 if you have a 30-pack-year history of smoking and currently smoke or have quit within the past 15 years. Fecal occult blood test (FOBT) of the stool. You may have this test every year starting at age 70. Flexible sigmoidoscopy or colonoscopy. You may have a sigmoidoscopy every 5 years or a colonoscopy every 10 years starting at age 70. Prostate cancer screening. Recommendations will vary depending on your family history and other risks. Hepatitis C blood test. Hepatitis B blood test. Sexually transmitted disease (STD) testing. Diabetes screening. This is done by checking your blood sugar (glucose) after you have not eaten for a while (fasting). You may have this done every 1-3 years. Abdominal aortic aneurysm (AAA) screening. You may need  this if you are a current or former smoker. Osteoporosis. You may be screened starting at age 70 if you are at high risk. Talk with your health care provider about your test results, treatment options, and if necessary, the need for more tests. Vaccines  Your health care provider may recommend certain vaccines, such as: Influenza vaccine. This is recommended every year. Tetanus, diphtheria, and acellular pertussis (Tdap, Td)  vaccine. You may need a Td booster every 10 years. Zoster vaccine. You may need this after age 70. Pneumococcal 13-valent conjugate (PCV13) vaccine. One dose is recommended after age 70. Pneumococcal polysaccharide (PPSV23) vaccine. One dose is recommended after age 70. Talk to your health care provider about which screenings and vaccines you need and how often you need them. This information is not intended to replace advice given to you by your health care provider. Make sure you discuss any questions you have with your health care provider. Document Released: 10/21/2015 Document Revised: 06/13/2016 Document Reviewed: 07/26/2015 Elsevier Interactive Patient Education  2017 [place] Prevention in the Home Falls can cause injuries. They can happen to people of all ages. There are many things you can do to make your home safe and to help prevent falls. What can I do on the outside of my home? Regularly fix the edges of walkways and driveways and fix any cracks. Remove anything that might make you trip as you walk through a door, such as a raised step or threshold. Trim any bushes or trees on the path to your home. Use bright outdoor lighting. Clear any walking paths of anything that might make someone trip, such as rocks or tools. Regularly check to see if handrails are loose or broken. Make sure that both sides of any steps have handrails. Any raised decks and porches should have guardrails on the edges. Have any leaves, snow, or ice cleared regularly. Use sand or salt on walking paths during winter. Clean up any spills in your garage right away. This includes oil or grease spills. What can I do in the bathroom? Use night lights. Install grab bars by the toilet and in the tub and shower. Do not use towel bars as grab bars. Use non-skid mats or decals in the tub or shower. If you need to sit down in the shower, use a plastic, non-slip stool. Keep the floor dry. Clean up any  water that spills on the floor as soon as it happens. Remove soap buildup in the tub or shower regularly. Attach bath mats securely with double-sided non-slip rug tape. Do not have throw rugs and other things on the floor that can make you trip. What can I do in the bedroom? Use night lights. Make sure that you have a light by your bed that is easy to reach. Do not use any sheets or blankets that are too big for your bed. They should not hang down onto the floor. Have a firm chair that has side arms. You can use this for support while you get dressed. Do not have throw rugs and other things on the floor that can make you trip. What can I do in the kitchen? Clean up any spills right away. Avoid walking on wet floors. Keep items that you use a lot in easy-to-reach places. If you need to reach something above you, use a strong step stool that has a grab bar. Keep electrical cords out of the way. Do not use floor polish or wax that makes floors  slippery. If you must use wax, use non-skid floor wax. Do not have throw rugs and other things on the floor that can make you trip. What can I do with my stairs? Do not leave any items on the stairs. Make sure that there are handrails on both sides of the stairs and use them. Fix handrails that are broken or loose. Make sure that handrails are as long as the stairways. Check any carpeting to make sure that it is firmly attached to the stairs. Fix any carpet that is loose or worn. Avoid having throw rugs at the top or bottom of the stairs. If you do have throw rugs, attach them to the floor with carpet tape. Make sure that you have a light switch at the top of the stairs and the bottom of the stairs. If you do not have them, ask someone to add them for you. What else can I do to help prevent falls? Wear shoes that: Do not have high heels. Have rubber bottoms. Are comfortable and fit you well. Are closed at the toe. Do not wear sandals. If you use a  stepladder: Make sure that it is fully opened. Do not climb a closed stepladder. Make sure that both sides of the stepladder are locked into place. Ask someone to hold it for you, if possible. Clearly mark and make sure that you can see: Any grab bars or handrails. First and last steps. Where the edge of each step is. Use tools that help you move around (mobility aids) if they are needed. These include: Canes. Walkers. Scooters. Crutches. Turn on the lights when you go into a dark area. Replace any light bulbs as soon as they burn out. Set up your furniture so you have a clear path. Avoid moving your furniture around. If any of your floors are uneven, fix them. If there are any pets around you, be aware of where they are. Review your medicines with your doctor. Some medicines can make you feel dizzy. This can increase your chance of falling. Ask your doctor what other things that you can do to help prevent falls. This information is not intended to replace advice given to you by your health care provider. Make sure you discuss any questions you have with your health care provider. Document Released: 07/21/2009 Document Revised: 03/01/2016 Document Reviewed: 10/29/2014 Elsevier Interactive Patient Education  2017 [employer].

## 2021-04-03 NOTE — Progress Notes (Addendum)
Subjective:   Charles Nichols is a 70 y.o. male who presents for Medicare Annual/Subsequent preventive examination.  Review of Systems    No ROS.  Medicare Wellness Virtual Visit.  Visual/audio telehealth visit, UTA vital signs.   See social history for additional risk factors.   Cardiac Risk Factors include: male gender;hypertension;advanced age (>67men, >19 women)     Objective:    Today's Vitals   04/03/21 1235  Weight: (!) 309 lb (140.2 kg)  Height: 6\' 3"  (1.905 m)   Body mass index is 38.62 kg/m.  Advanced Directives 04/03/2021 03/31/2020 03/30/2019 03/27/2018 03/16/2016 04/08/2015  Does Patient Have a Medical Advance Directive? No No No No No No  Would patient like information on creating a medical advance directive? No - Patient declined No - Patient declined Yes (MAU/Ambulatory/Procedural Areas - Information given) Yes (MAU/Ambulatory/Procedural Areas - Information given) Yes - Educational materials given -    Current Medications (verified) Outpatient Encounter Medications as of 04/03/2021  Medication Sig   acetaminophen (TYLENOL) 325 MG tablet Take 650 mg by mouth every 6 (six) hours as needed for pain.   aspirin 81 MG tablet Take 81 mg by mouth daily.   chlorthalidone (HYGROTON) 25 MG tablet Take 1 tablet by mouth once daily   Cholecalciferol (VITAMIN D3) 2000 UNITS TABS Take by mouth daily.   clobetasol cream (TEMOVATE) 6.73 % Apply 1 application topically 2 (two) times daily.   felodipine (PLENDIL) 10 MG 24 hr tablet Take 1 tablet by mouth once daily   lisinopril (ZESTRIL) 20 MG tablet Take 2 tablets by mouth once daily   lovastatin (MEVACOR) 40 MG tablet Take 1 tablet by mouth once daily   triamcinolone cream (KENALOG) 0.1 % APPLY TO ITCHY AREAS ON ARMS AND SCALP DAILY   vitamin B-12 (CYANOCOBALAMIN) 1000 MCG tablet Take 1,000 mcg by mouth daily.   No facility-administered encounter medications on file as of 04/03/2021.    Allergies (verified) Patient has no known  allergies.   History: Past Medical History:  Diagnosis Date   Arthritis    History of chicken pox    Hypertension    Obesity    Peripheral vascular disease (Charles Nichols)    Peripheral vascular disease ([place])    Past Surgical History:  Procedure Laterality Date   CIRCUMCISION     COLONOSCOPY WITH PROPOFOL N/A 04/08/2015   Procedure: COLONOSCOPY WITH PROPOFOL;  Surgeon: Lollie Sails, MD;  Location: [facility] ENDOSCOPY;  Service: Endoscopy;  Laterality: N/A;   ESOPHAGOGASTRODUODENOSCOPY N/A 04/08/2015   Procedure: ESOPHAGOGASTRODUODENOSCOPY (EGD);  Surgeon: Lollie Sails, MD;  Location: [facility] ENDOSCOPY;  Service: Endoscopy;  Laterality: N/A;   FEMORAL-POPLITEAL BYPASS GRAFT Bilateral 1997, 2000   leg stent     pvd   Family History  Problem Relation Age of Onset   Breast cancer Sister    Hypertension Sister    Hypertension Brother    Breast cancer Daughter    Social History   Socioeconomic History   Marital status: Legally Separated    Spouse name: Not on file   Number of children: Not on file   Years of education: Not on file   Highest education level: Not on file  Occupational History   Not on file  Tobacco Use   Smoking status: Former    Pack years: 0.00   Smokeless tobacco: Never  Vaping Use   Vaping Use: Never used  Substance and Sexual Activity   Alcohol use: Yes    Alcohol/week: 0.0 standard drinks  Drug use: No   Sexual activity: Not on file  Other Topics Concern   Not on file  Social History Narrative   Not on file   Social Determinants of Health   Financial Resource Strain: Low Risk    Difficulty of Paying Living Expenses: Not hard at all  Food Insecurity: No Food Insecurity   Worried About Running Out of Food in the Last Year: Never true   Charles Nichols in the Last Year: Never true  Transportation Needs: No Transportation Needs   Lack of Transportation (Medical): No   Lack of Transportation (Non-Medical): No  Physical Activity: Insufficiently Active    Days of Exercise per Week: 3 days   Minutes of Exercise per Session: 20 min  Stress: Not on file  Social Connections: Unknown   Frequency of Communication with Friends and Family: More than three times a week   Frequency of Social Gatherings with Friends and Family: More than three times a week   Attends Religious Services: Not on Electrical engineer or Organizations: Not on file   Attends Archivist Meetings: Not on file   Marital Status: Not on file    Tobacco Counseling Counseling given: Not Answered   Clinical Intake:  Pre-visit preparation completed: Yes        Diabetes: No  How often do you need to have someone help you when you read instructions, pamphlets, or other written materials from your doctor or pharmacy?: 1 - Never    Interpreter Needed?: No      Activities of Daily Living In your present state of health, do you have any difficulty performing the following activities: 04/03/2021  Hearing? N  Vision? N  Difficulty concentrating or making decisions? N  Walking or climbing stairs? Y  Comment Paces self. Cane in use as needed.  Dressing or bathing? N  Doing errands, shopping? N  Preparing Food and eating ? N  Using the Toilet? N  In the past six months, have you accidently leaked urine? N  Do you have problems with loss of bowel control? N  Managing your Medications? N  Managing your Finances? N  Housekeeping or managing your Housekeeping? N  Some recent data might be hidden    Patient Care Team: Charles Pheasant, MD as PCP - General (Internal Medicine)  Indicate any recent Medical Services you may have received from other than [facility] providers in the past year (date may be approximate).     Assessment:   This is a routine wellness examination for Charles Nichols.  I connected with Charles Nichols today by telephone and verified that I am speaking with the correct person using two identifiers. Location patient: home Location provider:  work Persons participating in the virtual visit: patient, Marine scientist.    I discussed the limitations, risks, security and privacy concerns of performing an evaluation and management service by telephone and the availability of in person appointments. The patient expressed understanding and verbally consented to this telephonic visit.    Interactive audio and video telecommunications were attempted between this provider and patient, however failed, due to patient having technical difficulties OR patient did not have access to video capability.  We continued and completed visit with audio only.  Some vital signs may be absent or patient reported.   Hearing/Vision screen Hearing Screening - Comments:: Patient is able to hear conversational tones without difficulty.  No issues reported. Vision Screening - Comments:: Followed by [facility]  Wears corrective  lenses when reading They have seen their ophthalmologist in the last 12 months.   Dietary issues and exercise activities discussed: Current Exercise Habits: Home exercise routine, Intensity: Mild Regular diet Good water intake   Goals Addressed               This Visit's Progress     Patient Stated     Follow up with Primary Care Provider (pt-stated)        As needed        Increase activity (pt-stated)        Stay active        Depression Screen PHQ 2/9 Scores 04/03/2021 06/21/2020 03/31/2020 03/30/2019 03/27/2018 08/22/2017 08/22/2016  PHQ - 2 Score 0 0 0 0 0 0 0    Fall Risk Fall Risk  04/03/2021 06/21/2020 03/31/2020 03/30/2019 03/27/2018  Falls in the past year? 0 0 0 0 No  Number falls in past yr: 0 0 0 - -  Injury with Fall? 0 0 - - -  Follow up Falls evaluation completed Falls evaluation completed Falls evaluation completed - -    FALL RISK PREVENTION PERTAINING TO THE HOME: Handrails in use when climbing stairs? Yes Home free of loose throw rugs in walkways, pet beds, electrical cords, etc? Yes  Adequate lighting  in your home to reduce risk of falls? Yes   ASSISTIVE DEVICES UTILIZED TO PREVENT FALLS: Use of a cane, walker or w/c? Yes , as needed  TIMED UP AND GO: Was the test performed? No .   Cognitive Function: Patient is alert and oriented x3.  Denies difficulty focusing, making decisions, memory loss.  MMSE/6CIT deferred. Normal by direct communication and observation.  MMSE - Mini Mental State Exam 03/27/2018  Orientation to time 5  Orientation to Place 5  Registration 3  Attention/ Calculation 5  Recall 3  Language- name 2 objects 2  Language- repeat 1  Language- follow 3 step command 3  Language- read & follow direction 1  Write a sentence 1  Copy design 1  Total score 30     6CIT Screen 04/03/2021 03/31/2020 03/30/2019  What Year? 0 points 0 points 0 points  What month? 0 points 0 points 0 points  What time? 0 points - 0 points  Count back from 20 0 points - 0 points  Months in reverse 0 points 0 points 0 points  Repeat phrase - 0 points 0 points  Total Score - - 0    Immunizations Immunization History  Administered Date(s) Administered   Fluad Quad(high Dose 65+) 08/03/2019, 06/21/2020   Influenza Split 06/29/2013, 09/21/2014   Influenza, High Dose Seasonal PF 08/22/2016, 08/22/2017, 09/02/2018   Influenza-Unspecified 07/03/2015   [place] SARS COV-2 Pediatric Vaccination 53mos to <88yrs 10/15/2020, 03/29/2021   Moderna Sars-Covid-2 Vaccination 11/19/2019, 12/21/2019   Pneumococcal Conjugate-13 04/12/2017   Pneumococcal Polysaccharide-23 12/03/2018   Tdap 04/02/2018   Zoster, Live 11/02/2014    Health Maintenance Health Maintenance  Topic Date Due   COVID-19 Vaccine (3 - Booster for Moderna series) 05/22/2020   Zoster Vaccines- Shingrix (1 of 2) 07/04/2021 (Originally 03/12/2001)   INFLUENZA VACCINE  05/08/2021   COLONOSCOPY (Pts 45-13yrs Insurance coverage will need to be confirmed)  06/06/2025   TETANUS/TDAP  04/02/2028   Hepatitis C Screening  Completed   PNA  vac Low Risk Adult  Completed   HPV VACCINES  Aged Out   Colorectal cancer screening: Type of screening: Colonoscopy. Completed 06/06/20. Repeat every 3 years  Lung Cancer  Screening: (Low Dose CT Chest recommended if Age 19-80 years, 30 pack-year currently smoking OR have quit w/in 15years.) does not qualify.   Dental Screening: Recommended annual dental exams for proper oral hygiene  Community Resource Referral / Chronic Care Management: CRR required this visit?  No   CCM required this visit?  No      Plan:   Keep all routine maintenance appointments.   I have personally reviewed and noted the following in the patient's chart:   Medical and social history Use of alcohol, tobacco or illicit drugs  Current medications and supplements including opioid prescriptions. Patient is not currently taking opioid prescriptions. Functional ability and status Nutritional status Physical activity Advanced directives List of other physicians Hospitalizations, surgeries, and ER visits in previous 12 months Vitals Screenings to include cognitive, depression, and falls Referrals and appointments  In addition, I have reviewed and discussed with patient certain preventive protocols, quality metrics, and best practice recommendations. A written personalized care plan for preventive services as well as general preventive health recommendations were provided to patient via mail.     Varney Biles, LPN   [id number]

## 2021-04-06 DIAGNOSIS — G5750 Tarsal tunnel syndrome, unspecified lower limb: Secondary | ICD-10-CM | POA: Diagnosis not present

## 2021-04-06 DIAGNOSIS — M25579 Pain in unspecified ankle and joints of unspecified foot: Secondary | ICD-10-CM | POA: Diagnosis not present

## 2021-04-06 DIAGNOSIS — M79671 Pain in right foot: Secondary | ICD-10-CM | POA: Diagnosis not present

## 2021-04-17 ENCOUNTER — Other Ambulatory Visit: Payer: Self-pay | Admitting: Internal Medicine

## 2021-04-17 DIAGNOSIS — I10 Essential (primary) hypertension: Secondary | ICD-10-CM

## 2021-04-24 ENCOUNTER — Other Ambulatory Visit: Payer: Self-pay

## 2021-04-24 ENCOUNTER — Ambulatory Visit (INDEPENDENT_AMBULATORY_CARE_PROVIDER_SITE_OTHER): Payer: Medicare Other | Admitting: Nurse Practitioner

## 2021-04-24 ENCOUNTER — Ambulatory Visit (INDEPENDENT_AMBULATORY_CARE_PROVIDER_SITE_OTHER): Payer: Medicare Other

## 2021-04-24 ENCOUNTER — Encounter (INDEPENDENT_AMBULATORY_CARE_PROVIDER_SITE_OTHER): Payer: Self-pay | Admitting: Nurse Practitioner

## 2021-04-24 VITALS — BP 150/87 | HR 83 | Resp 16 | Ht 75.0 in | Wt 300.0 lb

## 2021-04-24 DIAGNOSIS — I70213 Atherosclerosis of native arteries of extremities with intermittent claudication, bilateral legs: Secondary | ICD-10-CM

## 2021-04-24 DIAGNOSIS — E7800 Pure hypercholesterolemia, unspecified: Secondary | ICD-10-CM | POA: Diagnosis not present

## 2021-04-24 DIAGNOSIS — I10 Essential (primary) hypertension: Secondary | ICD-10-CM | POA: Diagnosis not present

## 2021-04-24 DIAGNOSIS — M79672 Pain in left foot: Secondary | ICD-10-CM | POA: Diagnosis not present

## 2021-04-24 NOTE — Progress Notes (Signed)
Subjective:    Patient ID: Charles Nichols, male    DOB: [birth year], 70 y.o.   MRN: [id number] Chief Complaint  Patient presents with   Follow-up    ultrasound    The patient returns to the office for followup and review of the noninvasive studies. There have been no interval changes in lower extremity symptoms. No interval shortening of the patient's claudication distance or development of rest pain symptoms. No new ulcers or wounds have occurred since the last visit.  There have been no significant changes to the patient's overall health care.  The patient denies amaurosis fugax or recent TIA symptoms. There are no recent neurological changes noted. The patient denies history of DVT, PE or superficial thrombophlebitis. The patient denies recent episodes of angina or shortness of breath.   ABI Rt=1.12 and Lt=0.68  (previous ABI's Rt=0.64 and Lt=0.97) Duplex ultrasound of the right lower extremity shows monophasic waveforms throughout the right lower extremity as well as the left lower extremity.  The right distal SFA is occluded and this is noted as on the previous exam on 05/05/2020.  There is also total occlusion of the left SFA with his left SFA bypass graft occluded as noted in the previous exam on 05/05/2020.  The patient does have extensive velocities near the common femoral artery and deep femoral artery bilaterally suggesting aortoiliac level disease.   Review of Systems     Objective:   Physical Exam  BP (!) 150/87 (BP Location: Left Arm)   Pulse 83   Resp 16   Ht 6\' 3"  (1.905 m)   Wt 300 lb (136.1 kg)   BMI 37.50 kg/m   Past Medical History:  Diagnosis Date   Arthritis    History of chicken pox    Hypertension    Obesity    Peripheral vascular disease (HCC)    Peripheral vascular disease (HCC)     Social History   Socioeconomic History   Marital status: Legally Separated    Spouse name: Not on file   Number of children: Not on file   Years of education: Not  on file   Highest education level: Not on file  Occupational History   Not on file  Tobacco Use   Smoking status: Former   Smokeless tobacco: Never  Vaping Use   Vaping Use: Never used  Substance and Sexual Activity   Alcohol use: Yes    Alcohol/week: 0.0 standard drinks   Drug use: No   Sexual activity: Not on file  Other Topics Concern   Not on file  Social History Narrative   Not on file   Social Determinants of Health   Financial Resource Strain: Low Risk    Difficulty of Paying Living Expenses: Not hard at all  Food Insecurity: No Food Insecurity   Worried About Charity fundraiser in the Last Year: Never true   [place] in the Last Year: Never true  Transportation Needs: No Transportation Needs   Lack of Transportation (Medical): No   Lack of Transportation (Non-Medical): No  Physical Activity: Insufficiently Active   Days of Exercise per Week: 3 days   Minutes of Exercise per Session: 20 min  Stress: Not on file  Social Connections: Unknown   Frequency of Communication with Friends and Family: More than three times a week   Frequency of Social Gatherings with Friends and Family: More than three times a week   Attends Religious Services: Not on file  Active Member of Clubs or Organizations: Not on file   Attends Archivist Meetings: Not on file   Marital Status: Not on file  Intimate Partner Violence: Not At Risk   Fear of Current or Ex-Partner: No   Emotionally Abused: No   Physically Abused: No   Sexually Abused: No    Past Surgical History:  Procedure Laterality Date   CIRCUMCISION     COLONOSCOPY WITH PROPOFOL N/A 04/08/2015   Procedure: COLONOSCOPY WITH PROPOFOL;  Surgeon: Lollie Sails, MD;  Location: [facility] ENDOSCOPY;  Service: Endoscopy;  Laterality: N/A;   ESOPHAGOGASTRODUODENOSCOPY N/A 04/08/2015   Procedure: ESOPHAGOGASTRODUODENOSCOPY (EGD);  Surgeon: Lollie Sails, MD;  Location: [facility] ENDOSCOPY;  Service: Endoscopy;   Laterality: N/A;   FEMORAL-POPLITEAL BYPASS GRAFT Bilateral 1997, 2000   leg stent     pvd    Family History  Problem Relation Age of Onset   Breast cancer Sister    Hypertension Sister    Hypertension Brother    Breast cancer Daughter     No Known Allergies  CBC Latest Ref Rng & Units 06/21/2020 12/16/2019 08/03/2019  WBC 4.0 - 10.5 K/uL 6.9 7.3 5.9  Hemoglobin 13.0 - 17.0 g/dL 12.2(L) 12.6(L) 12.9(L)  Hematocrit 39.0 - 52.0 % 37.1(L) 37.4(L) 39.0  Platelets 150.0 - 400.0 K/uL 218.0 218.0 196.0      CMP     Component Value Date/Time   NA 138 06/21/2020 1246   NA 141 05/20/2015 0539   K 3.7 06/21/2020 1246   CL 100 06/21/2020 1246   CO2 27 06/21/2020 1246   GLUCOSE 101 (H) 06/21/2020 1246   BUN 16 06/21/2020 1246   BUN 13 05/20/2015 0539   CREATININE 1.07 06/21/2020 1246   CALCIUM 10.4 06/21/2020 1246   PROT 7.4 06/21/2020 1246   ALBUMIN 4.1 06/21/2020 1246   AST 20 06/21/2020 1246   ALT 14 06/21/2020 1246   ALKPHOS 63 06/21/2020 1246   BILITOT 0.4 06/21/2020 1246     No results found.     Assessment & Plan:   1. Atherosclerosis of native arteries of extremities with intermittent claudication, bilateral legs (HCC) The patient's noninvasive studies are still relatively stable.  The patient still has suspected aortoiliac disease with occlusion of the bypass graft in his left lower extremity.  We we discussed the possibility of angiogram however the patient notes that his symptoms are not significantly worse he wishes to continue with watchful waiting at this time.  Patient is advised to continue with activity and continue medications as prescribed.  Patient is also advised that if he begins to experience worsening claudication, rest pain or numbness and discoloration of his lower extremities he should contact our office for further work-up and evaluation.  2. Essential hypertension, benign Continue antihypertensive medications as already ordered, these medications  have been reviewed and there are no changes at this time.   3. Hypercholesterolemia Continue statin as ordered and reviewed, no changes at this time   4. Left foot pain The patient has recently been seen podiatry again and after cortisone shots it is feeling much better.  Patient will continue to follow with podiatry for left foot discomfort.   Current Outpatient Medications on File Prior to Visit  Medication Sig Dispense Refill   acetaminophen (TYLENOL) 325 MG tablet Take 650 mg by mouth every 6 (six) hours as needed for pain.     aspirin 81 MG tablet Take 81 mg by mouth daily.     chlorthalidone (HYGROTON)  25 MG tablet Take 1 tablet by mouth once daily 90 tablet 0   Cholecalciferol (VITAMIN D3) 2000 UNITS TABS Take by mouth daily.     clobetasol cream (TEMOVATE) 1.61 % Apply 1 application topically 2 (two) times daily. 30 g 0   felodipine (PLENDIL) 10 MG 24 hr tablet Take 1 tablet by mouth once daily 90 tablet 0   lisinopril (ZESTRIL) 20 MG tablet Take 2 tablets by mouth once daily 180 tablet 0   lovastatin (MEVACOR) 40 MG tablet Take 1 tablet by mouth once daily 90 tablet 0   naproxen (NAPROSYN) 500 MG tablet Take 500 mg by mouth 2 (two) times daily.     triamcinolone cream (KENALOG) 0.1 % APPLY TO ITCHY AREAS ON ARMS AND SCALP DAILY     vitamin B-12 (CYANOCOBALAMIN) 1000 MCG tablet Take 1,000 mcg by mouth daily.     No current facility-administered medications on file prior to visit.    There are no Patient Instructions on file for this visit. No follow-ups on file.   Kris Hartmann, NP

## 2021-05-04 DIAGNOSIS — M79671 Pain in right foot: Secondary | ICD-10-CM | POA: Diagnosis not present

## 2021-05-04 DIAGNOSIS — G5750 Tarsal tunnel syndrome, unspecified lower limb: Secondary | ICD-10-CM | POA: Diagnosis not present

## 2021-05-04 DIAGNOSIS — M25579 Pain in unspecified ankle and joints of unspecified foot: Secondary | ICD-10-CM | POA: Diagnosis not present

## 2021-05-05 DIAGNOSIS — M170 Bilateral primary osteoarthritis of knee: Secondary | ICD-10-CM | POA: Diagnosis not present

## 2021-05-24 DIAGNOSIS — G608 Other hereditary and idiopathic neuropathies: Secondary | ICD-10-CM | POA: Diagnosis not present

## 2021-05-24 DIAGNOSIS — G2581 Restless legs syndrome: Secondary | ICD-10-CM | POA: Diagnosis not present

## 2021-05-24 DIAGNOSIS — M21371 Foot drop, right foot: Secondary | ICD-10-CM | POA: Diagnosis not present

## 2021-05-24 DIAGNOSIS — R269 Unspecified abnormalities of gait and mobility: Secondary | ICD-10-CM | POA: Diagnosis not present

## 2021-06-09 ENCOUNTER — Encounter: Payer: Medicare Other | Admitting: Internal Medicine

## 2021-06-14 ENCOUNTER — Other Ambulatory Visit: Payer: Self-pay

## 2021-06-14 ENCOUNTER — Ambulatory Visit (INDEPENDENT_AMBULATORY_CARE_PROVIDER_SITE_OTHER): Payer: Medicare Other | Admitting: Internal Medicine

## 2021-06-14 VITALS — BP 138/82 | HR 92 | Temp 97.9°F | Resp 16 | Ht 75.0 in | Wt 301.6 lb

## 2021-06-14 DIAGNOSIS — R739 Hyperglycemia, unspecified: Secondary | ICD-10-CM | POA: Diagnosis not present

## 2021-06-14 DIAGNOSIS — I25118 Atherosclerotic heart disease of native coronary artery with other forms of angina pectoris: Secondary | ICD-10-CM

## 2021-06-14 DIAGNOSIS — Z0000 Encounter for general adult medical examination without abnormal findings: Secondary | ICD-10-CM

## 2021-06-14 DIAGNOSIS — Z23 Encounter for immunization: Secondary | ICD-10-CM

## 2021-06-14 DIAGNOSIS — Z860100 Personal history of colon polyps, unspecified: Secondary | ICD-10-CM

## 2021-06-14 DIAGNOSIS — I739 Peripheral vascular disease, unspecified: Secondary | ICD-10-CM

## 2021-06-14 DIAGNOSIS — E7800 Pure hypercholesterolemia, unspecified: Secondary | ICD-10-CM | POA: Diagnosis not present

## 2021-06-14 DIAGNOSIS — D649 Anemia, unspecified: Secondary | ICD-10-CM | POA: Diagnosis not present

## 2021-06-14 DIAGNOSIS — Z125 Encounter for screening for malignant neoplasm of prostate: Secondary | ICD-10-CM | POA: Diagnosis not present

## 2021-06-14 DIAGNOSIS — I10 Essential (primary) hypertension: Secondary | ICD-10-CM

## 2021-06-14 DIAGNOSIS — R7989 Other specified abnormal findings of blood chemistry: Secondary | ICD-10-CM

## 2021-06-14 DIAGNOSIS — M21371 Foot drop, right foot: Secondary | ICD-10-CM

## 2021-06-14 LAB — HEPATIC FUNCTION PANEL
ALT: 13 U/L (ref 0–53)
AST: 17 U/L (ref 0–37)
Albumin: 4 g/dL (ref 3.5–5.2)
Alkaline Phosphatase: 58 U/L (ref 39–117)
Bilirubin, Direct: 0.1 mg/dL (ref 0.0–0.3)
Total Bilirubin: 0.4 mg/dL (ref 0.2–1.2)
Total Protein: 7.3 g/dL (ref 6.0–8.3)

## 2021-06-14 LAB — CBC WITH DIFFERENTIAL/PLATELET
Basophils Absolute: 0 10*3/uL (ref 0.0–0.1)
Basophils Relative: 0.5 % (ref 0.0–3.0)
Eosinophils Absolute: 0.1 10*3/uL (ref 0.0–0.7)
Eosinophils Relative: 1.3 % (ref 0.0–5.0)
HCT: 37.4 % — ABNORMAL LOW (ref 39.0–52.0)
Hemoglobin: 12.2 g/dL — ABNORMAL LOW (ref 13.0–17.0)
Lymphocytes Relative: 18.2 % (ref 12.0–46.0)
Lymphs Abs: 1.2 10*3/uL (ref 0.7–4.0)
MCHC: 32.7 g/dL (ref 30.0–36.0)
MCV: 82.5 fl (ref 78.0–100.0)
Monocytes Absolute: 0.8 10*3/uL (ref 0.1–1.0)
Monocytes Relative: 12 % (ref 3.0–12.0)
Neutro Abs: 4.6 10*3/uL (ref 1.4–7.7)
Neutrophils Relative %: 68 % (ref 43.0–77.0)
Platelets: 208 10*3/uL (ref 150.0–400.0)
RBC: 4.53 Mil/uL (ref 4.22–5.81)
RDW: 16 % — ABNORMAL HIGH (ref 11.5–15.5)
WBC: 6.7 10*3/uL (ref 4.0–10.5)

## 2021-06-14 LAB — BASIC METABOLIC PANEL
BUN: 14 mg/dL (ref 6–23)
CO2: 26 mEq/L (ref 19–32)
Calcium: 10 mg/dL (ref 8.4–10.5)
Chloride: 103 mEq/L (ref 96–112)
Creatinine, Ser: 0.92 mg/dL (ref 0.40–1.50)
GFR: 84.43 mL/min (ref >60.00)
Glucose, Bld: 96 mg/dL (ref 70–99)
Potassium: 3.8 mEq/L (ref 3.5–5.1)
Sodium: 139 mEq/L (ref 135–145)

## 2021-06-14 LAB — LIPID PANEL
Cholesterol: 140 mg/dL (ref 0–200)
HDL: 37.7 mg/dL — ABNORMAL LOW (ref >39.00)
NonHDL: 102.27
Total CHOL/HDL Ratio: 4
Triglycerides: 207 mg/dL — ABNORMAL HIGH (ref 0.0–149.0)
VLDL: 41.4 mg/dL — ABNORMAL HIGH (ref 0.0–40.0)

## 2021-06-14 LAB — IBC + FERRITIN
Ferritin: 588.3 ng/mL — ABNORMAL HIGH (ref 22.0–322.0)
Iron: 68 ug/dL (ref 42–165)
Saturation Ratios: 20.2 % (ref 20.0–50.0)
TIBC: 337.4 ug/dL (ref 250.0–450.0)
Transferrin: 241 mg/dL (ref 212.0–360.0)

## 2021-06-14 LAB — LDL CHOLESTEROL, DIRECT: Direct LDL: 78 mg/dL

## 2021-06-14 LAB — PSA, MEDICARE: PSA: 1.76 ng/ml (ref 0.10–4.00)

## 2021-06-14 LAB — HEMOGLOBIN A1C: Hgb A1c MFr Bld: 5.9 % (ref 4.6–6.5)

## 2021-06-14 LAB — TSH: TSH: 5.46 u[IU]/mL (ref 0.35–5.50)

## 2021-06-14 NOTE — Progress Notes (Signed)
Patient ID: Charles Nichols, male   DOB: April 24, 1951, 70 y.o.   MRN: [id number]   Subjective:    Patient ID: Charles Nichols, male    DOB: 25-Oct-1950, 70 y.o.   MRN: [id number]  This visit occurred during the SARS-CoV-2 public health emergency.  Safety protocols were in place, including screening questions prior to the visit, additional usage of staff PPE, and extensive cleaning of exam room while observing appropriate contact time as indicated for disinfecting solutions.   Patient here for physical exam.  Chief Complaint  Patient presents with   Annual Exam   .   HPI He  is doing relatively well.  Saw neurology 05/24/21.  F/u gait abnormality due to right foot drop and painless - polyneuropathy.  Recommended continuing oral B12.  Also recommended wearing splints for CTS. They discussed restless legs and prescribed pramipexole.  He does not feel he needs anything for this at this time.  Tries to stay active.  OA in knees.  Saw ortho.  Also sees [facility] for f/u PAD.  Noninvasive studies are relatively stable.  Previous pain in feet  - injection helped.  Overall he feels things are stable and desires no further intervention.  No chest pain or sob reported.  No abdominal pain.  Bowels moving.  Blood pressure stable.    Past Medical History:  Diagnosis Date   Arthritis    History of chicken pox    Hypertension    Obesity    Peripheral vascular disease ([place])    Peripheral vascular disease ([place])    Past Surgical History:  Procedure Laterality Date   CIRCUMCISION     COLONOSCOPY WITH PROPOFOL N/A 04/08/2015   Procedure: COLONOSCOPY WITH PROPOFOL;  Surgeon: Lollie Sails, MD;  Location: [facility] ENDOSCOPY;  Service: Endoscopy;  Laterality: N/A;   ESOPHAGOGASTRODUODENOSCOPY N/A 04/08/2015   Procedure: ESOPHAGOGASTRODUODENOSCOPY (EGD);  Surgeon: Lollie Sails, MD;  Location: [facility] ENDOSCOPY;  Service: Endoscopy;  Laterality: N/A;   FEMORAL-POPLITEAL BYPASS GRAFT Bilateral 1997, 2000   leg stent     pvd    Family History  Problem Relation Age of Onset   Breast cancer Sister    Hypertension Sister    Hypertension Brother    Breast cancer Daughter    Social History   Socioeconomic History   Marital status: Legally Separated    Spouse name: Not on file   Number of children: Not on file   Years of education: Not on file   Highest education level: Not on file  Occupational History   Not on file  Tobacco Use   Smoking status: Former   Smokeless tobacco: Never  Vaping Use   Vaping Use: Never used  Substance and Sexual Activity   Alcohol use: Yes    Alcohol/week: 0.0 standard drinks   Drug use: No   Sexual activity: Not on file  Other Topics Concern   Not on file  Social History Narrative   Not on file   Social Determinants of Health   Financial Resource Strain: Low Risk    Difficulty of Paying Living Expenses: Not hard at all  Food Insecurity: No Food Insecurity   Worried About Charity fundraiser in the Last Year: Never true   [place] in the Last Year: Never true  Transportation Needs: No Transportation Needs   Lack of Transportation (Medical): No   Lack of Transportation (Non-Medical): No  Physical Activity: Insufficiently Active   Days of Exercise per Week: 3  days   Minutes of Exercise per Session: 20 min  Stress: Not on file  Social Connections: Unknown   Frequency of Communication with Friends and Family: More than three times a week   Frequency of Social Gatherings with Friends and Family: More than three times a week   Attends Religious Services: Not on file   Active Member of Clubs or Organizations: Not on file   Attends Archivist Meetings: Not on file   Marital Status: Not on file     Review of Systems  Constitutional:  Negative for fatigue and unexpected weight change.  HENT:  Negative for congestion, sinus pressure and sore throat.   Eyes:  Negative for pain and visual disturbance.  Respiratory:  Negative for cough, chest tightness  and shortness of breath.   Cardiovascular:  Negative for chest pain, palpitations and leg swelling.  Gastrointestinal:  Negative for abdominal pain, constipation and diarrhea.  Genitourinary:  Negative for difficulty urinating and dysuria.  Musculoskeletal:  Negative for joint swelling and myalgias.       Back/leg pain as outlined.  Stable   Skin:  Negative for color change and rash.  Neurological:  Negative for dizziness, light-headedness and headaches.  Hematological:  Negative for adenopathy. Does not bruise/bleed easily.  Psychiatric/Behavioral:  Negative for decreased concentration and dysphoric mood.       Objective:     BP 138/82   Pulse 92   Temp 97.9 F (36.6 C)   Resp 16   Ht '6\' 3"'  (1.905 m)   Wt (!) 301 lb 9.6 oz (136.8 kg)   SpO2 98%   BMI 37.70 kg/m  Wt Readings from Last 3 Encounters:  06/14/21 (!) 301 lb 9.6 oz (136.8 kg)  04/24/21 300 lb (136.1 kg)  04/03/21 (!) 309 lb (140.2 kg)    Physical Exam Constitutional:      General: He is not in acute distress.    Appearance: Normal appearance. He is well-developed.  HENT:     Head: Normocephalic and atraumatic.     Right Ear: External ear normal.     Left Ear: External ear normal.  Eyes:     General: No scleral icterus.       Right eye: No discharge.        Left eye: No discharge.     Conjunctiva/sclera: Conjunctivae normal.  Neck:     Thyroid: No thyromegaly.  Cardiovascular:     Rate and Rhythm: Normal rate and regular rhythm.  Pulmonary:     Effort: No respiratory distress.     Breath sounds: Normal breath sounds. No wheezing.  Abdominal:     General: Bowel sounds are normal.     Palpations: Abdomen is soft.     Tenderness: There is no abdominal tenderness.  Musculoskeletal:        General: No swelling or tenderness.     Cervical back: Neck supple. No tenderness.  Lymphadenopathy:     Cervical: No cervical adenopathy.  Skin:    Findings: No erythema or rash.  Neurological:     Mental Status:  He is alert and oriented to person, place, and time.  Psychiatric:        Mood and Affect: Mood normal.        Behavior: Behavior normal.     Outpatient Encounter Medications as of 06/14/2021  Medication Sig   acetaminophen (TYLENOL) 325 MG tablet Take 650 mg by mouth every 6 (six) hours as needed for pain.   aspirin  81 MG tablet Take 81 mg by mouth daily.   chlorthalidone (HYGROTON) 25 MG tablet Take 1 tablet by mouth once daily   Cholecalciferol (VITAMIN D3) 2000 UNITS TABS Take by mouth daily.   clobetasol cream (TEMOVATE) 3.14 % Apply 1 application topically 2 (two) times daily.   felodipine (PLENDIL) 10 MG 24 hr tablet Take 1 tablet by mouth once daily   lisinopril (ZESTRIL) 20 MG tablet Take 2 tablets by mouth once daily   lovastatin (MEVACOR) 40 MG tablet Take 1 tablet by mouth once daily   naproxen (NAPROSYN) 500 MG tablet Take 500 mg by mouth 2 (two) times daily.   triamcinolone cream (KENALOG) 0.1 % APPLY TO ITCHY AREAS ON ARMS AND SCALP DAILY   vitamin B-12 (CYANOCOBALAMIN) 1000 MCG tablet Take 1,000 mcg by mouth daily.   No facility-administered encounter medications on file as of 06/14/2021.     Lab Results  Component Value Date   WBC 6.7 06/14/2021   HGB 12.2 (L) 06/14/2021   HCT 37.4 (L) 06/14/2021   PLT 208.0 06/14/2021   GLUCOSE 96 06/14/2021   CHOL 140 06/14/2021   TRIG 207.0 (H) 06/14/2021   HDL 37.70 (L) 06/14/2021   LDLDIRECT 78.0 06/14/2021   LDLCALC 59 12/16/2019   ALT 13 06/14/2021   AST 17 06/14/2021   NA 139 06/14/2021   K 3.8 06/14/2021   CL 103 06/14/2021   CREATININE 0.92 06/14/2021   BUN 14 06/14/2021   CO2 26 06/14/2021   TSH 5.46 06/14/2021   PSA 1.76 06/14/2021   HGBA1C 5.9 06/14/2021       Assessment & Plan:   Problem List Items Addressed This Visit     Anemia    Recheck cbc today.       Relevant Orders   IBC + Ferritin (Completed)   Benign essential hypertension    Blood pressure as outlined.  Continue lisinopril,  chlorthalidone and plendil.  Follow pressures.  Follow metabolic panel.        Relevant Orders   Basic metabolic panel (Completed)   CAD (coronary artery disease)    No chest pain.  Continue risk factor modification.       Elevated ferritin    Recheck cbc and iron studies.        Health care maintenance    Physical today 06/14/21.  Colonoscopy 06/06/20 - one 62m polyp (transverse colon), one 24mpolyp in the sigmoid colon and one 78m25molyp in the sigmoid colon, diverticulosis.  Check psa today.       History of colonic polyps    Colonoscopy 06/06/20 - one 4mm45mlyp (transverse colon), one 2mm 88myp in the sigmoid colon and one 78mm p40mp in the sigmoid colon, diverticulosis.  Per pt, scheduled for f/u colonoscopy.        Hypercholesterolemia    On lovastatin.  Low cholesterol diet and exercise.  Follow lipid panel and liver function tests.        Relevant Orders   Hepatic function panel (Completed)   Lipid panel (Completed)   Hyperglycemia    Low carb and exercise.  Follow met b and a1c.        Relevant Orders   Hemoglobin A1c (Completed)   Peripheral vascular disease (HCC)    Followed by [facility].  Continue risk factor modification.  Noninvasive studies stable.       Right foot drop    Followed by neurology.  Continue brace.  Stable.       Other  Visit Diagnoses     Routine general medical examination at a health care facility    -  Primary   Essential hypertension, benign       Relevant Orders   CBC with Differential/Platelet (Completed)   TSH (Completed)   Prostate cancer screening       Relevant Orders   PSA, Medicare (Completed)   Need for immunization against influenza       Relevant Orders   Flu Vaccine QUAD High Dose(Fluad) (Completed)        Einar Pheasant, MD

## 2021-06-18 ENCOUNTER — Encounter: Payer: Self-pay | Admitting: Internal Medicine

## 2021-06-18 NOTE — Assessment & Plan Note (Signed)
Physical today 06/14/21.  Colonoscopy 06/06/20 - one 42m polyp (transverse colon), one 278mpolyp in the sigmoid colon and one 81m29molyp in the sigmoid colon, diverticulosis.  Check psa today.

## 2021-06-18 NOTE — Assessment & Plan Note (Signed)
Recheck cbc today.   

## 2021-06-18 NOTE — Assessment & Plan Note (Signed)
Followed by [facility].  Continue risk factor modification.  Noninvasive studies stable.

## 2021-06-18 NOTE — Assessment & Plan Note (Signed)
Low carb and exercise.  Follow met b and a1c.   °

## 2021-06-18 NOTE — Assessment & Plan Note (Signed)
Colonoscopy 06/06/20 - one 16m polyp (transverse colon), one 258mpolyp in the sigmoid colon and one 60m82molyp in the sigmoid colon, diverticulosis.  Per pt, scheduled for f/u colonoscopy.

## 2021-06-18 NOTE — Assessment & Plan Note (Signed)
On lovastatin.  Low cholesterol diet and exercise.  Follow lipid panel and liver function tests.   

## 2021-06-18 NOTE — Assessment & Plan Note (Signed)
Blood pressure as outlined.  Continue lisinopril, chlorthalidone and plendil.  Follow pressures.  Follow metabolic panel.

## 2021-06-18 NOTE — Assessment & Plan Note (Signed)
Recheck cbc and iron studies.  

## 2021-06-18 NOTE — Assessment & Plan Note (Signed)
No chest pain.  Continue risk factor modification.  

## 2021-06-18 NOTE — Assessment & Plan Note (Signed)
Followed by neurology.  Continue brace.  Stable.

## 2021-07-17 ENCOUNTER — Other Ambulatory Visit: Payer: Self-pay | Admitting: Internal Medicine

## 2021-07-17 DIAGNOSIS — I10 Essential (primary) hypertension: Secondary | ICD-10-CM

## 2021-08-07 DIAGNOSIS — M170 Bilateral primary osteoarthritis of knee: Secondary | ICD-10-CM | POA: Diagnosis not present

## 2021-08-07 DIAGNOSIS — M25522 Pain in left elbow: Secondary | ICD-10-CM | POA: Diagnosis not present

## 2021-08-21 DIAGNOSIS — M1990 Unspecified osteoarthritis, unspecified site: Secondary | ICD-10-CM | POA: Diagnosis not present

## 2021-08-21 DIAGNOSIS — M25579 Pain in unspecified ankle and joints of unspecified foot: Secondary | ICD-10-CM | POA: Diagnosis not present

## 2021-08-21 DIAGNOSIS — M79671 Pain in right foot: Secondary | ICD-10-CM | POA: Diagnosis not present

## 2021-09-11 DIAGNOSIS — M25579 Pain in unspecified ankle and joints of unspecified foot: Secondary | ICD-10-CM | POA: Diagnosis not present

## 2021-09-11 DIAGNOSIS — M79672 Pain in left foot: Secondary | ICD-10-CM | POA: Diagnosis not present

## 2021-09-11 DIAGNOSIS — G5750 Tarsal tunnel syndrome, unspecified lower limb: Secondary | ICD-10-CM | POA: Diagnosis not present

## 2021-10-09 DIAGNOSIS — M79671 Pain in right foot: Secondary | ICD-10-CM | POA: Diagnosis not present

## 2021-10-09 DIAGNOSIS — M25579 Pain in unspecified ankle and joints of unspecified foot: Secondary | ICD-10-CM | POA: Diagnosis not present

## 2021-10-09 DIAGNOSIS — M79672 Pain in left foot: Secondary | ICD-10-CM | POA: Diagnosis not present

## 2021-10-09 DIAGNOSIS — G5750 Tarsal tunnel syndrome, unspecified lower limb: Secondary | ICD-10-CM | POA: Diagnosis not present

## 2021-10-17 ENCOUNTER — Other Ambulatory Visit: Payer: Self-pay

## 2021-10-17 ENCOUNTER — Ambulatory Visit (INDEPENDENT_AMBULATORY_CARE_PROVIDER_SITE_OTHER): Payer: Medicare Other | Admitting: Internal Medicine

## 2021-10-17 VITALS — BP 136/84 | HR 78 | Ht 75.0 in | Wt 291.8 lb

## 2021-10-17 DIAGNOSIS — R739 Hyperglycemia, unspecified: Secondary | ICD-10-CM | POA: Diagnosis not present

## 2021-10-17 DIAGNOSIS — E7800 Pure hypercholesterolemia, unspecified: Secondary | ICD-10-CM

## 2021-10-17 DIAGNOSIS — R7989 Other specified abnormal findings of blood chemistry: Secondary | ICD-10-CM

## 2021-10-17 DIAGNOSIS — Z86010 Personal history of colonic polyps: Secondary | ICD-10-CM

## 2021-10-17 DIAGNOSIS — I739 Peripheral vascular disease, unspecified: Secondary | ICD-10-CM

## 2021-10-17 DIAGNOSIS — D649 Anemia, unspecified: Secondary | ICD-10-CM

## 2021-10-17 DIAGNOSIS — I10 Essential (primary) hypertension: Secondary | ICD-10-CM | POA: Diagnosis not present

## 2021-10-17 DIAGNOSIS — I25118 Atherosclerotic heart disease of native coronary artery with other forms of angina pectoris: Secondary | ICD-10-CM

## 2021-10-17 DIAGNOSIS — M21371 Foot drop, right foot: Secondary | ICD-10-CM

## 2021-10-17 LAB — LIPID PANEL
Cholesterol: 121 mg/dL (ref 0–200)
HDL: 36.9 mg/dL — ABNORMAL LOW (ref >39.00)
LDL Cholesterol: 58 mg/dL (ref 0–99)
NonHDL: 83.73
Total CHOL/HDL Ratio: 3
Triglycerides: 131 mg/dL (ref 0.0–149.0)
VLDL: 26.2 mg/dL (ref 0.0–40.0)

## 2021-10-17 LAB — IBC + FERRITIN
Ferritin: 603.2 ng/mL — ABNORMAL HIGH (ref 22.0–322.0)
Iron: 76 ug/dL (ref 42–165)
Saturation Ratios: 23.8 % (ref 20.0–50.0)
TIBC: 319.2 ug/dL (ref 250.0–450.0)
Transferrin: 228 mg/dL (ref 212.0–360.0)

## 2021-10-17 LAB — HEPATIC FUNCTION PANEL
ALT: 11 U/L (ref 0–53)
AST: 12 U/L (ref 0–37)
Albumin: 4 g/dL (ref 3.5–5.2)
Alkaline Phosphatase: 63 U/L (ref 39–117)
Bilirubin, Direct: 0.1 mg/dL (ref 0.0–0.3)
Total Bilirubin: 0.5 mg/dL (ref 0.2–1.2)
Total Protein: 7.2 g/dL (ref 6.0–8.3)

## 2021-10-17 LAB — BASIC METABOLIC PANEL
BUN: 15 mg/dL (ref 6–23)
CO2: 27 mEq/L (ref 19–32)
Calcium: 9.8 mg/dL (ref 8.4–10.5)
Chloride: 103 mEq/L (ref 96–112)
Creatinine, Ser: 1.01 mg/dL (ref 0.40–1.50)
GFR: 75.3 mL/min (ref >60.00)
Glucose, Bld: 84 mg/dL (ref 70–99)
Potassium: 4 mEq/L (ref 3.5–5.1)
Sodium: 136 mEq/L (ref 135–145)

## 2021-10-17 LAB — CBC WITH DIFFERENTIAL/PLATELET
Basophils Absolute: 0 10*3/uL (ref 0.0–0.1)
Basophils Relative: 0.6 % (ref 0.0–3.0)
Eosinophils Absolute: 0.1 10*3/uL (ref 0.0–0.7)
Eosinophils Relative: 1.6 % (ref 0.0–5.0)
HCT: 36 % — ABNORMAL LOW (ref 39.0–52.0)
Hemoglobin: 11.4 g/dL — ABNORMAL LOW (ref 13.0–17.0)
Lymphocytes Relative: 17.1 % (ref 12.0–46.0)
Lymphs Abs: 1.2 10*3/uL (ref 0.7–4.0)
MCHC: 31.7 g/dL (ref 30.0–36.0)
MCV: 82 fl (ref 78.0–100.0)
Monocytes Absolute: 0.8 10*3/uL (ref 0.1–1.0)
Monocytes Relative: 11.7 % (ref 3.0–12.0)
Neutro Abs: 5 10*3/uL (ref 1.4–7.7)
Neutrophils Relative %: 69 % (ref 43.0–77.0)
Platelets: 218 10*3/uL (ref 150.0–400.0)
RBC: 4.39 Mil/uL (ref 4.22–5.81)
RDW: 16 % — ABNORMAL HIGH (ref 11.5–15.5)
WBC: 7.2 10*3/uL (ref 4.0–10.5)

## 2021-10-17 LAB — HEMOGLOBIN A1C: Hgb A1c MFr Bld: 6.3 % (ref 4.6–6.5)

## 2021-10-17 MED ORDER — LISINOPRIL 20 MG PO TABS: 40.0000 mg | ORAL_TABLET | Freq: Every day | ORAL | 3 refills | Status: DC

## 2021-10-17 MED ORDER — CHLORTHALIDONE 25 MG PO TABS: 25.0000 mg | ORAL_TABLET | Freq: Every day | ORAL | 3 refills | Status: DC

## 2021-10-17 MED ORDER — LOVASTATIN 40 MG PO TABS: 40.0000 mg | ORAL_TABLET | Freq: Every day | ORAL | 3 refills | Status: DC

## 2021-10-17 MED ORDER — FELODIPINE ER 10 MG PO TB24: 10.0000 mg | ORAL_TABLET | Freq: Every day | ORAL | 3 refills | Status: DC

## 2021-10-17 NOTE — Progress Notes (Signed)
Patient ID: Charles Nichols, male   DOB: 10-Nov-1950, 71 y.o.   MRN: [id number]   Subjective:    Patient ID: Charles Nichols, male    DOB: [birth year], 71 y.o.   MRN: [id number]  This visit occurred during the SARS-CoV-2 public health emergency.  Safety protocols were in place, including screening questions prior to the visit, additional usage of staff PPE, and extensive cleaning of exam room while observing appropriate contact time as indicated for disinfecting solutions.   Patient here for a scheduled follow up.    HPI Saw neurology 05/24/21.  F/u gait abnormality due to right foot drop and painless - polyneuropathy. Recommended continuing oral B12.  Also recommended wearing splints for CTS. They discussed restless legs and prescribed pramipexole. No taking.  Feels legs - stable.  He is seeing a podiatrist (Dr Caryl Comes) in [place].  Injection helped - foot pain.  Also seeing ortho for f/u right knee pain.  Has f/u end of month.  Denies any chest pain or sob.  No acid reflux.  No abdominal pain or bowel change.  Blood pressure averaging 130s/80s.  Overall he feels he is doing well.    Past Medical History:  Diagnosis Date   Arthritis    History of chicken pox    Hypertension    Obesity    Peripheral vascular disease ([place])    Peripheral vascular disease ([place])    Past Surgical History:  Procedure Laterality Date   CIRCUMCISION     COLONOSCOPY WITH PROPOFOL N/A 04/08/2015   Procedure: COLONOSCOPY WITH PROPOFOL;  Surgeon: Lollie Sails, MD;  Location: [facility] ENDOSCOPY;  Service: Endoscopy;  Laterality: N/A;   ESOPHAGOGASTRODUODENOSCOPY N/A 04/08/2015   Procedure: ESOPHAGOGASTRODUODENOSCOPY (EGD);  Surgeon: Lollie Sails, MD;  Location: [facility] ENDOSCOPY;  Service: Endoscopy;  Laterality: N/A;   FEMORAL-POPLITEAL BYPASS GRAFT Bilateral 1997, 2000   leg stent     pvd   Family History  Problem Relation Age of Onset   Breast cancer Sister    Hypertension Sister    Hypertension Brother    Breast cancer  Daughter    Social History   Socioeconomic History   Marital status: Legally Separated    Spouse name: Not on file   Number of children: Not on file   Years of education: Not on file   Highest education level: Not on file  Occupational History   Not on file  Tobacco Use   Smoking status: Former   Smokeless tobacco: Never  Vaping Use   Vaping Use: Never used  Substance and Sexual Activity   Alcohol use: Yes    Alcohol/week: 0.0 standard drinks   Drug use: No   Sexual activity: Not on file  Other Topics Concern   Not on file  Social History Narrative   Not on file   Social Determinants of Health   Financial Resource Strain: Low Risk    Difficulty of Paying Living Expenses: Not hard at all  Food Insecurity: No Food Insecurity   Worried About Charity fundraiser in the Last Year: Never true   [place] in the Last Year: Never true  Transportation Needs: No Transportation Needs   Lack of Transportation (Medical): No   Lack of Transportation (Non-Medical): No  Physical Activity: Insufficiently Active   Days of Exercise per Week: 3 days   Minutes of Exercise per Session: 20 min  Stress: Not on file  Social Connections: Unknown   Frequency of Communication with Friends and  Family: More than three times a week   Frequency of Social Gatherings with Friends and Family: More than three times a week   Attends Religious Services: Not on file   Active Member of Clubs or Organizations: Not on file   Attends Archivist Meetings: Not on file   Marital Status: Not on file     Review of Systems  Constitutional:  Negative for appetite change and unexpected weight change.  HENT:  Negative for congestion and sinus pressure.   Respiratory:  Negative for cough, chest tightness and shortness of breath.   Cardiovascular:  Negative for chest pain, palpitations and leg swelling.  Gastrointestinal:  Negative for abdominal pain, diarrhea, nausea and vomiting.  Genitourinary:   Negative for difficulty urinating and dysuria.  Musculoskeletal:  Negative for myalgias.       Foot pain and right knee pain as outlined.   Skin:  Negative for color change and rash.  Neurological:  Negative for dizziness, light-headedness and headaches.  Psychiatric/Behavioral:  Negative for agitation and dysphoric mood.       Objective:     BP 136/84    Pulse 78    Ht 6' 3" (1.905 m)    Wt 291 lb 12.8 oz (132.4 kg)    SpO2 97%    BMI 36.47 kg/m  Wt Readings from Last 3 Encounters:  10/17/21 291 lb 12.8 oz (132.4 kg)  06/14/21 (!) 301 lb 9.6 oz (136.8 kg)  04/24/21 300 lb (136.1 kg)    Physical Exam Constitutional:      General: He is not in acute distress.    Appearance: Normal appearance. He is well-developed.  HENT:     Head: Normocephalic and atraumatic.     Right Ear: External ear normal.     Left Ear: External ear normal.  Eyes:     General: No scleral icterus.       Right eye: No discharge.        Left eye: No discharge.  Cardiovascular:     Rate and Rhythm: Normal rate and regular rhythm.  Pulmonary:     Effort: Pulmonary effort is normal. No respiratory distress.     Breath sounds: Normal breath sounds.  Abdominal:     General: Bowel sounds are normal.     Palpations: Abdomen is soft.     Tenderness: There is no abdominal tenderness.  Musculoskeletal:        General: No swelling or tenderness.     Cervical back: Neck supple. No tenderness.  Lymphadenopathy:     Cervical: No cervical adenopathy.  Skin:    Findings: No erythema or rash.  Neurological:     Mental Status: He is alert.  Psychiatric:        Mood and Affect: Mood normal.        Behavior: Behavior normal.     Outpatient Encounter Medications as of 10/17/2021  Medication Sig   acetaminophen (TYLENOL) 325 MG tablet Take 650 mg by mouth every 6 (six) hours as needed for pain.   aspirin 81 MG tablet Take 81 mg by mouth daily.   Cholecalciferol (VITAMIN D3) 2000 UNITS TABS Take by mouth daily.    naproxen (NAPROSYN) 500 MG tablet Take 500 mg by mouth 2 (two) times daily.   triamcinolone cream (KENALOG) 0.1 % APPLY TO ITCHY AREAS ON ARMS AND SCALP DAILY   vitamin B-12 (CYANOCOBALAMIN) 1000 MCG tablet Take 1,000 mcg by mouth daily.   [DISCONTINUED] chlorthalidone (HYGROTON) 25 MG tablet  Take 1 tablet by mouth once daily   [DISCONTINUED] felodipine (PLENDIL) 10 MG 24 hr tablet Take 1 tablet by mouth once daily   [DISCONTINUED] lisinopril (ZESTRIL) 20 MG tablet Take 2 tablets by mouth once daily   [DISCONTINUED] lovastatin (MEVACOR) 40 MG tablet Take 1 tablet by mouth once daily   chlorthalidone (HYGROTON) 25 MG tablet Take 1 tablet (25 mg total) by mouth daily.   felodipine (PLENDIL) 10 MG 24 hr tablet Take 1 tablet (10 mg total) by mouth daily.   lisinopril (ZESTRIL) 20 MG tablet Take 2 tablets (40 mg total) by mouth daily.   lovastatin (MEVACOR) 40 MG tablet Take 1 tablet (40 mg total) by mouth daily.   [DISCONTINUED] clobetasol cream (TEMOVATE) 1.65 % Apply 1 application topically 2 (two) times daily. (Patient not taking: Reported on 10/17/2021)   No facility-administered encounter medications on file as of 10/17/2021.     Lab Results  Component Value Date   WBC 7.2 10/17/2021   HGB 11.4 (L) 10/17/2021   HCT 36.0 (L) 10/17/2021   PLT 218.0 10/17/2021   GLUCOSE 84 10/17/2021   CHOL 121 10/17/2021   TRIG 131.0 10/17/2021   HDL 36.90 (L) 10/17/2021   LDLDIRECT 78.0 06/14/2021   LDLCALC 58 10/17/2021   ALT 11 10/17/2021   AST 12 10/17/2021   NA 136 10/17/2021   K 4.0 10/17/2021   CL 103 10/17/2021   CREATININE 1.01 10/17/2021   BUN 15 10/17/2021   CO2 27 10/17/2021   TSH 5.46 06/14/2021   PSA 1.76 06/14/2021   HGBA1C 6.3 10/17/2021       Assessment & Plan:   Problem List Items Addressed This Visit     Anemia    Recheck cbc today.       Relevant Orders   CBC with Differential/Platelet (Completed)   Benign essential hypertension    Blood pressure as outlined.   Continue lisinopril, chlorthalidone and plendil.  Follow pressures.  Follow metabolic panel.        Relevant Medications   lovastatin (MEVACOR) 40 MG tablet   lisinopril (ZESTRIL) 20 MG tablet   felodipine (PLENDIL) 10 MG 24 hr tablet   chlorthalidone (HYGROTON) 25 MG tablet   Other Relevant Orders   Basic metabolic panel (Completed)   CAD (coronary artery disease)    No chest pain.  Continue risk factor modification.       Relevant Medications   lovastatin (MEVACOR) 40 MG tablet   lisinopril (ZESTRIL) 20 MG tablet   felodipine (PLENDIL) 10 MG 24 hr tablet   chlorthalidone (HYGROTON) 25 MG tablet   Elevated ferritin    Have discussed with hematology.  Recheck cbc and iron studies.        Relevant Orders   IBC + Ferritin (Completed)   History of colonic polyps    Colonoscopy 06/06/20 - one 49m polyp (transverse colon), one 246mpolyp in the sigmoid colon and one 61m54molyp in the sigmoid colon, diverticulosis.  Per pt, scheduled for f/u colonoscopy.  Recommended f/u colonoscopy in 2 years.       Hypercholesterolemia    On lovastatin.  Low cholesterol diet and exercise.  Follow lipid panel and liver function tests.        Relevant Medications   lovastatin (MEVACOR) 40 MG tablet   lisinopril (ZESTRIL) 20 MG tablet   felodipine (PLENDIL) 10 MG 24 hr tablet   chlorthalidone (HYGROTON) 25 MG tablet   Other Relevant Orders   Hepatic function panel (Completed)  Lipid panel (Completed)   Hyperglycemia    Low carb and exercise.  Follow met b and a1c.        Relevant Orders   Hemoglobin A1c (Completed)   Peripheral vascular disease ([place]) - Primary    Has seen [facility].  Continue risk factor modification.  No pain.  Follow.        Relevant Medications   lovastatin (MEVACOR) 40 MG tablet   lisinopril (ZESTRIL) 20 MG tablet   felodipine (PLENDIL) 10 MG 24 hr tablet   chlorthalidone (HYGROTON) 25 MG tablet   Right foot drop    Followed by neurology.  Continue brace.  Stable.        Other Visit Diagnoses     Essential hypertension, benign       Relevant Medications   lovastatin (MEVACOR) 40 MG tablet   lisinopril (ZESTRIL) 20 MG tablet   felodipine (PLENDIL) 10 MG 24 hr tablet   chlorthalidone (HYGROTON) 25 MG tablet        Einar Pheasant, MD

## 2021-10-20 ENCOUNTER — Telehealth: Payer: Self-pay

## 2021-10-20 DIAGNOSIS — D649 Anemia, unspecified: Secondary | ICD-10-CM

## 2021-10-20 NOTE — Telephone Encounter (Signed)
Placed orders for CBC for future labs

## 2021-10-21 NOTE — Progress Notes (Signed)
MRN : [id number]  Arjay Jaskiewicz Gasner is a 71 y.o. (Mar 30, 1951) male who presents with chief complaint of check circulation.  History of Present Illness:   The patient returns to the office for followup and review of the noninvasive studies. There have been no interval changes in lower extremity symptoms. No interval shortening of the patient's claudication distance or development of rest pain symptoms. No new ulcers or wounds have occurred since the last visit.   There have been no significant changes to the patient's overall health care.   The patient denies amaurosis fugax or recent TIA symptoms. There are no recent neurological changes noted. The patient denies history of DVT, PE or superficial thrombophlebitis. The patient denies recent episodes of angina or shortness of breath.   Previous duplex ultrasound of the right lower extremity shows monophasic waveforms throughout the right lower extremity as well as the left lower extremity.  The right distal SFA is occluded and this is noted as on the previous exam on 05/05/2020.  There is also total occlusion of the left SFA with his left SFA bypass graft occluded as noted in the previous exam on 05/05/2020.  The patient does have extensive velocities near the common femoral artery and deep femoral artery bilaterally suggesting aortoiliac level disease.   ABI Rt=1.06 and Lt=1.12  monophasic signals bilaterally (previous ABI's Rt=1.12 and Lt=0.68)  Duplex ultrasound of the bilateral lower extremities confirms the above findings  No outpatient medications have been marked as taking for the 10/23/21 encounter (Appointment) with Delana Meyer, Dolores Lory, MD.    Past Medical History:  Diagnosis Date   Arthritis    History of chicken pox    Hypertension    Obesity    Peripheral vascular disease ([place])    Peripheral vascular disease ([place])     Past Surgical History:  Procedure Laterality Date   CIRCUMCISION     COLONOSCOPY WITH PROPOFOL N/A 04/08/2015    Procedure: COLONOSCOPY WITH PROPOFOL;  Surgeon: Lollie Sails, MD;  Location: [facility] ENDOSCOPY;  Service: Endoscopy;  Laterality: N/A;   ESOPHAGOGASTRODUODENOSCOPY N/A 04/08/2015   Procedure: ESOPHAGOGASTRODUODENOSCOPY (EGD);  Surgeon: Lollie Sails, MD;  Location: [facility] ENDOSCOPY;  Service: Endoscopy;  Laterality: N/A;   FEMORAL-POPLITEAL BYPASS GRAFT Bilateral 1997, 2000   leg stent     pvd    Social History Social History   Tobacco Use   Smoking status: Former   Smokeless tobacco: Never  Scientific laboratory technician Use: Never used  Substance Use Topics   Alcohol use: Yes    Alcohol/week: 0.0 standard drinks   Drug use: No    Family History Family History  Problem Relation Age of Onset   Breast cancer Sister    Hypertension Sister    Hypertension Brother    Breast cancer Daughter     No Known Allergies   REVIEW OF SYSTEMS (Negative unless checked)  Constitutional: [] Weight loss  [] Fever  [] Chills Cardiac: [] Chest pain   [] Chest pressure   [] Palpitations   [] Shortness of breath when laying flat   [] Shortness of breath with exertion. Vascular:  [] Pain in legs with walking   [] Pain in legs at rest  [] History of DVT   [] Phlebitis   [] Swelling in legs   [] Varicose veins   [] Non-healing ulcers Pulmonary:   [] Uses home oxygen   [] Productive cough   [] Hemoptysis   [] Wheeze  [] COPD   [] Asthma Neurologic:  [] Dizziness   [] Seizures   [] History of stroke   [] History of  TIA  [] Aphasia   [] Vissual changes   [] Weakness or numbness in arm   [] Weakness or numbness in leg Musculoskeletal:   [] Joint swelling   [] Joint pain   [] Low back pain Hematologic:  [] Easy bruising  [] Easy bleeding   [] Hypercoagulable state   [] Anemic Gastrointestinal:  [] Diarrhea   [] Vomiting  [] Gastroesophageal reflux/heartburn   [] Difficulty swallowing. Genitourinary:  [] Chronic kidney disease   [] Difficult urination  [] Frequent urination   [] Blood in urine Skin:  [] Rashes   [] Ulcers  Psychological:  [] History of  anxiety   []  History of major depression.  Physical Examination  There were no vitals filed for this visit. There is no height or weight on file to calculate BMI. Gen: WD/WN, NAD Head: [place]/AT, No temporalis wasting.  Ear/Nose/Throat: Hearing grossly intact, nares w/o erythema or drainage Eyes: PER, EOMI, sclera nonicteric.  Neck: Supple, no masses.  No bruit or JVD.  Pulmonary:  Good air movement, no audible wheezing, no use of accessory muscles.  Cardiac: RRR, normal S1, S2, no Murmurs. Vascular:   Vessel Right Left  Radial Palpable Palpable  Carotid Palpable Palpable  PT Not Palpable Not Palpable  DP Not Palpable Not Palpable  Gastrointestinal: soft, non-distended. No guarding/no peritoneal signs.  Musculoskeletal: M/S 5/5 throughout.  No visible deformity.  Neurologic: CN 2-12 intact. Pain and light touch intact in extremities.  Symmetrical.  Speech is fluent. Motor exam as listed above. Psychiatric: Judgment intact, Mood & affect appropriate for pt's clinical situation. Dermatologic: No rashes or ulcers noted.  No changes consistent with cellulitis.   CBC Lab Results  Component Value Date   WBC 7.2 10/17/2021   HGB 11.4 (L) 10/17/2021   HCT 36.0 (L) 10/17/2021   MCV 82.0 10/17/2021   PLT 218.0 10/17/2021    BMET    Component Value Date/Time   NA 136 10/17/2021 1209   NA 141 05/20/2015 0539   K 4.0 10/17/2021 1209   CL 103 10/17/2021 1209   CO2 27 10/17/2021 1209   GLUCOSE 84 10/17/2021 1209   BUN 15 10/17/2021 1209   BUN 13 05/20/2015 0539   CREATININE 1.01 10/17/2021 1209   CALCIUM 9.8 10/17/2021 1209   Estimated Creatinine Clearance: 99.8 mL/min (by C-G formula based on SCr of 1.01 mg/dL).  COAG No results found for: INR, PROTIME  Radiology No results found.   Assessment/Plan 1. Atherosclerosis of native artery of both lower extremities with intermittent claudication (HCC)  Recommend:  The patient has evidence of atherosclerosis of the lower  extremities with claudication.  The patient does not voice lifestyle limiting changes at this point in time.  Noninvasive studies do not suggest clinically significant change.  No invasive studies, angiography or surgery at this time The patient should continue walking and begin a more formal exercise program.  The patient should continue antiplatelet therapy and aggressive treatment of the lipid abnormalities  No changes in the patient's medications at this time  The patient should continue wearing graduated compression socks 10-15 mmHg strength to control the mild edema.   - VAS [place] ABI WITH/WO TBI; Future  2. Chronic venous insufficiency No surgery or intervention at this point in time.    I have had a long discussion with the patient regarding venous insufficiency and why it  causes symptoms. I have discussed with the patient the chronic skin changes that accompany venous insufficiency and the long term sequela such as infection and ulceration.  Patient will begin wearing graduated compression stockings class 1 (20-30 mmHg) or  compression wraps on a daily basis a prescription was given. The patient will put the stockings on first thing in the morning and removing them in the evening. The patient is instructed specifically not to sleep in the stockings.    In addition, behavioral modification including several periods of elevation of the lower extremities during the day will be continued. I have demonstrated that proper elevation is a position with the ankles at heart level.  The patient is instructed to begin routine exercise, especially walking on a daily basis  3. Coronary artery disease of native artery of native heart with stable angina pectoris (HCC) Continue cardiac and antihypertensive medications as already ordered and reviewed, no changes at this time.  Continue statin as ordered and reviewed, no changes at this time  Nitrates PRN for chest pain   4. Benign essential  hypertension Continue antihypertensive medications as already ordered, these medications have been reviewed and there are no changes at this time.   5. Hypercholesterolemia Continue statin as ordered and reviewed, no changes at this time     Hortencia Pilar, MD  10/21/2021 4:45 PM

## 2021-10-22 ENCOUNTER — Encounter: Payer: Self-pay | Admitting: Internal Medicine

## 2021-10-22 NOTE — Assessment & Plan Note (Signed)
Has seen [facility].  Continue risk factor modification.  No pain.  Follow.

## 2021-10-22 NOTE — Assessment & Plan Note (Signed)
Have discussed with hematology.  Recheck cbc and iron studies.

## 2021-10-22 NOTE — Assessment & Plan Note (Signed)
Low carb and exercise.  Follow met b and a1c.

## 2021-10-22 NOTE — Assessment & Plan Note (Signed)
Followed by neurology.  Continue brace.  Stable.

## 2021-10-22 NOTE — Assessment & Plan Note (Signed)
Blood pressure as outlined.  Continue lisinopril, chlorthalidone and plendil.  Follow pressures.  Follow metabolic panel.

## 2021-10-22 NOTE — Assessment & Plan Note (Signed)
On lovastatin.  Low cholesterol diet and exercise.  Follow lipid panel and liver function tests.   

## 2021-10-22 NOTE — Assessment & Plan Note (Signed)
Colonoscopy 06/06/20 - one 4mm polyp (transverse colon), one 2mm polyp in the sigmoid colon and one 3mm polyp in the sigmoid colon, diverticulosis.  Per pt, scheduled for f/u colonoscopy.  Recommended f/u colonoscopy in 2 years.  

## 2021-10-22 NOTE — Addendum Note (Signed)
Addended by: Alisa Graff on: 10/22/2021 10:03 AM   Modules accepted: Level of Service

## 2021-10-22 NOTE — Assessment & Plan Note (Signed)
Recheck cbc today.   

## 2021-10-22 NOTE — Assessment & Plan Note (Signed)
No chest pain.  Continue risk factor modification.  

## 2021-10-23 ENCOUNTER — Other Ambulatory Visit (INDEPENDENT_AMBULATORY_CARE_PROVIDER_SITE_OTHER): Payer: Self-pay | Admitting: Nurse Practitioner

## 2021-10-23 ENCOUNTER — Ambulatory Visit (INDEPENDENT_AMBULATORY_CARE_PROVIDER_SITE_OTHER): Payer: Medicare Other

## 2021-10-23 ENCOUNTER — Other Ambulatory Visit: Payer: Self-pay

## 2021-10-23 ENCOUNTER — Ambulatory Visit (INDEPENDENT_AMBULATORY_CARE_PROVIDER_SITE_OTHER): Payer: Medicare Other | Admitting: Vascular Surgery

## 2021-10-23 ENCOUNTER — Encounter (INDEPENDENT_AMBULATORY_CARE_PROVIDER_SITE_OTHER): Payer: Self-pay | Admitting: Vascular Surgery

## 2021-10-23 VITALS — BP 137/79 | HR 92 | Resp 16 | Wt 289.0 lb

## 2021-10-23 DIAGNOSIS — I10 Essential (primary) hypertension: Secondary | ICD-10-CM

## 2021-10-23 DIAGNOSIS — Z9889 Other specified postprocedural states: Secondary | ICD-10-CM

## 2021-10-23 DIAGNOSIS — I739 Peripheral vascular disease, unspecified: Secondary | ICD-10-CM | POA: Diagnosis not present

## 2021-10-23 DIAGNOSIS — I25118 Atherosclerotic heart disease of native coronary artery with other forms of angina pectoris: Secondary | ICD-10-CM | POA: Diagnosis not present

## 2021-10-23 DIAGNOSIS — I872 Venous insufficiency (chronic) (peripheral): Secondary | ICD-10-CM

## 2021-10-23 DIAGNOSIS — I70213 Atherosclerosis of native arteries of extremities with intermittent claudication, bilateral legs: Secondary | ICD-10-CM | POA: Diagnosis not present

## 2021-10-23 DIAGNOSIS — E7800 Pure hypercholesterolemia, unspecified: Secondary | ICD-10-CM

## 2021-10-29 ENCOUNTER — Encounter (INDEPENDENT_AMBULATORY_CARE_PROVIDER_SITE_OTHER): Payer: Self-pay | Admitting: Vascular Surgery

## 2021-11-09 DIAGNOSIS — G5750 Tarsal tunnel syndrome, unspecified lower limb: Secondary | ICD-10-CM | POA: Diagnosis not present

## 2021-11-09 DIAGNOSIS — M1990 Unspecified osteoarthritis, unspecified site: Secondary | ICD-10-CM | POA: Diagnosis not present

## 2021-11-09 DIAGNOSIS — M79672 Pain in left foot: Secondary | ICD-10-CM | POA: Diagnosis not present

## 2021-11-09 DIAGNOSIS — L609 Nail disorder, unspecified: Secondary | ICD-10-CM | POA: Diagnosis not present

## 2021-12-07 DIAGNOSIS — M79672 Pain in left foot: Secondary | ICD-10-CM | POA: Diagnosis not present

## 2021-12-07 DIAGNOSIS — M7662 Achilles tendinitis, left leg: Secondary | ICD-10-CM | POA: Diagnosis not present

## 2021-12-07 DIAGNOSIS — M79675 Pain in left toe(s): Secondary | ICD-10-CM | POA: Diagnosis not present

## 2021-12-08 ENCOUNTER — Other Ambulatory Visit: Payer: Medicare Other

## 2021-12-11 ENCOUNTER — Other Ambulatory Visit: Payer: Self-pay

## 2021-12-11 ENCOUNTER — Other Ambulatory Visit (INDEPENDENT_AMBULATORY_CARE_PROVIDER_SITE_OTHER): Payer: Medicare Other

## 2021-12-11 DIAGNOSIS — D649 Anemia, unspecified: Secondary | ICD-10-CM

## 2021-12-11 LAB — CBC WITH DIFFERENTIAL/PLATELET
Basophils Absolute: 0 10*3/uL (ref 0.0–0.1)
Basophils Relative: 0.5 % (ref 0.0–3.0)
Eosinophils Absolute: 0.2 10*3/uL (ref 0.0–0.7)
Eosinophils Relative: 1.8 % (ref 0.0–5.0)
HCT: 35.7 % — ABNORMAL LOW (ref 39.0–52.0)
Hemoglobin: 11.7 g/dL — ABNORMAL LOW (ref 13.0–17.0)
Lymphocytes Relative: 23.6 % (ref 12.0–46.0)
Lymphs Abs: 2 10*3/uL (ref 0.7–4.0)
MCHC: 32.9 g/dL (ref 30.0–36.0)
MCV: 82.6 fl (ref 78.0–100.0)
Monocytes Absolute: 0.9 10*3/uL (ref 0.1–1.0)
Monocytes Relative: 10.6 % (ref 3.0–12.0)
Neutro Abs: 5.5 10*3/uL (ref 1.4–7.7)
Neutrophils Relative %: 63.5 % (ref 43.0–77.0)
Platelets: 165 10*3/uL (ref 150.0–400.0)
RBC: 4.32 Mil/uL (ref 4.22–5.81)
RDW: 15.1 % (ref 11.5–15.5)
WBC: 8.7 10*3/uL (ref 4.0–10.5)

## 2021-12-13 ENCOUNTER — Other Ambulatory Visit: Payer: Self-pay | Admitting: Internal Medicine

## 2021-12-13 DIAGNOSIS — R7989 Other specified abnormal findings of blood chemistry: Secondary | ICD-10-CM

## 2021-12-13 DIAGNOSIS — D649 Anemia, unspecified: Secondary | ICD-10-CM

## 2021-12-13 NOTE — Progress Notes (Signed)
Order placed for hematology referral.  

## 2022-01-01 ENCOUNTER — Encounter (HOSPITAL_COMMUNITY): Payer: Medicare Other | Admitting: Hematology

## 2022-01-26 ENCOUNTER — Inpatient Hospital Stay (HOSPITAL_COMMUNITY): Payer: Medicare Other | Attending: Hematology | Admitting: Hematology

## 2022-01-26 ENCOUNTER — Inpatient Hospital Stay (HOSPITAL_COMMUNITY): Payer: Medicare Other

## 2022-01-26 VITALS — BP 147/78 | HR 89 | Temp 98.9°F | Resp 17 | Ht 75.0 in | Wt 281.8 lb

## 2022-01-26 DIAGNOSIS — Z87891 Personal history of nicotine dependence: Secondary | ICD-10-CM | POA: Diagnosis not present

## 2022-01-26 DIAGNOSIS — D649 Anemia, unspecified: Secondary | ICD-10-CM | POA: Diagnosis not present

## 2022-01-26 DIAGNOSIS — R7989 Other specified abnormal findings of blood chemistry: Secondary | ICD-10-CM | POA: Diagnosis not present

## 2022-01-26 DIAGNOSIS — Z803 Family history of malignant neoplasm of breast: Secondary | ICD-10-CM | POA: Insufficient documentation

## 2022-01-26 DIAGNOSIS — Z79899 Other long term (current) drug therapy: Secondary | ICD-10-CM | POA: Insufficient documentation

## 2022-01-26 LAB — CBC WITH DIFFERENTIAL/PLATELET
Abs Immature Granulocytes: 0.05 10*3/uL (ref 0.00–0.07)
Basophils Absolute: 0.1 10*3/uL (ref 0.0–0.1)
Basophils Relative: 1 %
Eosinophils Absolute: 0.2 10*3/uL (ref 0.0–0.5)
Eosinophils Relative: 2 %
HCT: 35.4 % — ABNORMAL LOW (ref 39.0–52.0)
Hemoglobin: 11.6 g/dL — ABNORMAL LOW (ref 13.0–17.0)
Immature Granulocytes: 1 %
Lymphocytes Relative: 27 %
Lymphs Abs: 2 10*3/uL (ref 0.7–4.0)
MCH: 26.8 pg (ref 26.0–34.0)
MCHC: 32.8 g/dL (ref 30.0–36.0)
MCV: 81.8 fL (ref 80.0–100.0)
Monocytes Absolute: 0.7 10*3/uL (ref 0.1–1.0)
Monocytes Relative: 10 %
Neutro Abs: 4.6 10*3/uL (ref 1.7–7.7)
Neutrophils Relative %: 59 %
Platelets: 232 10*3/uL (ref 150–400)
RBC: 4.33 MIL/uL (ref 4.22–5.81)
RDW: 14.2 % (ref 11.5–15.5)
WBC: 7.7 10*3/uL (ref 4.0–10.5)
nRBC: 0 % (ref 0.0–0.2)

## 2022-01-26 LAB — LACTATE DEHYDROGENASE: LDH: 108 U/L (ref 98–192)

## 2022-01-26 LAB — COMPREHENSIVE METABOLIC PANEL
ALT: 15 U/L (ref 0–44)
AST: 18 U/L (ref 15–41)
Albumin: 3.8 g/dL (ref 3.5–5.0)
Alkaline Phosphatase: 59 U/L (ref 38–126)
Anion gap: 8 (ref 5–15)
BUN: 24 mg/dL — ABNORMAL HIGH (ref 8–23)
CO2: 24 mmol/L (ref 22–32)
Calcium: 9.6 mg/dL (ref 8.9–10.3)
Chloride: 107 mmol/L (ref 98–111)
Creatinine, Ser: 1.16 mg/dL (ref 0.61–1.24)
GFR, Estimated: >60 mL/min (ref >60)
Glucose, Bld: 100 mg/dL — ABNORMAL HIGH (ref 70–99)
Potassium: 3.6 mmol/L (ref 3.5–5.1)
Sodium: 139 mmol/L (ref 135–145)
Total Bilirubin: 0.4 mg/dL (ref 0.3–1.2)
Total Protein: 8.5 g/dL — ABNORMAL HIGH (ref 6.5–8.1)

## 2022-01-26 LAB — FERRITIN: Ferritin: 495 ng/mL — ABNORMAL HIGH (ref 24–336)

## 2022-01-26 LAB — IRON AND TIBC
Iron: 77 ug/dL (ref 45–182)
Saturation Ratios: 24 % (ref 17.9–39.5)
TIBC: 322 ug/dL (ref 250–450)
UIBC: 245 ug/dL

## 2022-01-26 LAB — RETICULOCYTES
Immature Retic Fract: 15.2 % (ref 2.3–15.9)
RBC.: 4.38 MIL/uL (ref 4.22–5.81)
Retic Count, Absolute: 63.9 10*3/uL (ref 19.0–186.0)
Retic Ct Pct: 1.5 % (ref 0.4–3.1)

## 2022-01-26 LAB — VITAMIN B12: Vitamin B-12: 3427 pg/mL — ABNORMAL HIGH (ref 180–914)

## 2022-01-26 LAB — C-REACTIVE PROTEIN: CRP: 1.5 mg/dL — ABNORMAL HIGH (ref <1.0)

## 2022-01-26 LAB — FOLATE: Folate: 16.7 ng/mL (ref >5.9)

## 2022-01-26 LAB — SEDIMENTATION RATE: Sed Rate: 75 mm/hr — ABNORMAL HIGH (ref 0–16)

## 2022-01-26 NOTE — Patient Instructions (Addendum)
[place] at [facility] ?Discharge Instructions ? ?You were seen and examined today by Dr. Delton Coombes. Dr. Delton Coombes is a hematologist, meaning that he specializes in blood abnormalities. Dr. Delton Coombes discussed your past medical history, family history of cancers/blood conditions and the events that led to you being here today. ? ?You were referred to Dr. Delton Coombes due to elevated ferritin (iron storage). Your ferritin has been high since 2017 while you have also been slightly anemic since 2015. Dr. Delton Coombes has recommended additional lab work today, one to check for a condition known as Hemochromatosis, which is a hereditary disorder that can cause elevated ferritin. ? ?Please follow-up as scheduled. ? ? ?Thank you for choosing [place] at [facility] to provide your oncology and hematology care.  To afford each patient quality time with our provider, please arrive at least 15 minutes before your scheduled appointment time.  ? ?If you have a lab appointment with the [facility] please come in thru the Main Entrance and check in at the main information desk. ? ?You need to re-schedule your appointment should you arrive 10 or more minutes late.  We strive to give you quality time with our providers, and arriving late affects you and other patients whose appointments are after yours.  Also, if you no show three or more times for appointments you may be dismissed from the clinic at the providers discretion.     ?Again, thank you for choosing [facility].  Our hope is that these requests will decrease the amount of time that you wait before being seen by our physicians.       ?_____________________________________________________________ ? ?Should you have questions after your visit to [facility], please contact our office at [phone] and follow the prompts.  Our office hours are 8:00 a.m. and 4:30 p.m. Monday - Friday.  Please  note that voicemails left after 4:00 p.m. may not be returned until the following business day.  We are closed weekends and major holidays.  You do have access to a nurse 24-7, just call the main number to the clinic [phone] and do not press any options, hold on the line and a nurse will answer the phone.   ? ?For prescription refill requests, have your pharmacy contact our office and allow 72 hours.   ? ?Due to Covid, you will need to wear a mask upon entering the hospital. If you do not have a mask, a mask will be given to you at the Main Entrance upon arrival. For doctor visits, patients may have 1 support person age 77 or older with them. For treatment visits, patients can not have anyone with them due to social distancing guidelines and our immunocompromised population.  ? ? ? ?

## 2022-01-26 NOTE — Progress Notes (Signed)
? ?[facility] ?[address] ?[place],  [address] ? ? ?CLINIC:  ?Medical Oncology/Hematology ? ?Patient Care Team: ?Einar Pheasant, MD as PCP - General (Internal Medicine) ?Derek Jack, MD as Medical Oncologist (Hematology) ? ?CHIEF COMPLAINTS/PURPOSE OF CONSULTATION:  ?Evaluation of elevated ferritin ? ?HISTORY OF PRESENTING ILLNESS:  ?Charles Nichols 71 y.o. male is here because of evaluation of elevated ferritin, at the request of Dr. Nicki Reaper. ? ?Today he reports feeling good. He denies history of phlebotomy. He reports history of arthritis starting at age 20 with joint pains throughout his body. He denies history of liver problems. He reports skin discoloration on his feet. He reports history of drop foot in his right foot. He denies numbness/tingling. He denies history of CVA and MI. He reports he is trying to lose weight. He denies history of blood transfusions. He reports the last time he donated blood was 4 years ago. He takes Naproxen prn for pain in his feet and ankles. He currently takes vitamin B-12.  ? ?Prior to retirement he worked in a [employer]. He reports chemical exposure while working including acids and dyes. He quit smoking in 1997. He reports occasional alcohol consumption. He denies family history of hemochromatosis. Two sisters and his daughter had breast cancer; his daughter passed from the breast cancer. He denies family history of lupus.  ? ?MEDICAL HISTORY:  ?Past Medical History:  ?Diagnosis Date  ? Arthritis   ? History of chicken pox   ? Hypertension   ? Obesity   ? Peripheral vascular disease ([place])   ? Peripheral vascular disease ([place])   ? ? ?SURGICAL HISTORY: ?Past Surgical History:  ?Procedure Laterality Date  ? CIRCUMCISION    ? COLONOSCOPY WITH PROPOFOL N/A 04/08/2015  ? Procedure: COLONOSCOPY WITH PROPOFOL;  Surgeon: Lollie Sails, MD;  Location: [facility] ENDOSCOPY;  Service: Endoscopy;  Laterality: N/A;  ? ESOPHAGOGASTRODUODENOSCOPY N/A 04/08/2015  ?  Procedure: ESOPHAGOGASTRODUODENOSCOPY (EGD);  Surgeon: Lollie Sails, MD;  Location: [facility] ENDOSCOPY;  Service: Endoscopy;  Laterality: N/A;  ? FEMORAL-POPLITEAL BYPASS GRAFT Bilateral 1997, 2000  ? leg stent    ? pvd  ? ? ?SOCIAL HISTORY: ?Social History  ? ?Socioeconomic History  ? Marital status: Legally Separated  ?  Spouse name: Not on file  ? Number of children: Not on file  ? Years of education: Not on file  ? Highest education level: Not on file  ?Occupational History  ? Not on file  ?Tobacco Use  ? Smoking status: Former  ? Smokeless tobacco: Never  ?Vaping Use  ? Vaping Use: Never used  ?Substance and Sexual Activity  ? Alcohol use: Yes  ?  Alcohol/week: 0.0 standard drinks  ? Drug use: No  ? Sexual activity: Not on file  ?Other Topics Concern  ? Not on file  ?Social History Narrative  ? Not on file  ? ?Social Determinants of Health  ? ?Financial Resource Strain: Low Risk   ? Difficulty of Paying Living Expenses: Not hard at all  ?Food Insecurity: No Food Insecurity  ? Worried About Charity fundraiser in the Last Year: Never true  ? Ran Out of Food in the Last Year: Never true  ?Transportation Needs: No Transportation Needs  ? Lack of Transportation (Medical): No  ? Lack of Transportation (Non-Medical): No  ?Physical Activity: Insufficiently Active  ? Days of Exercise per Week: 3 days  ? Minutes of Exercise per Session: 20 min  ?Stress: Not on file  ?Social  Connections: Unknown  ? Frequency of Communication with Friends and Family: More than three times a week  ? Frequency of Social Gatherings with Friends and Family: More than three times a week  ? Attends Religious Services: Not on file  ? Active Member of Clubs or Organizations: Not on file  ? Attends Archivist Meetings: Not on file  ? Marital Status: Not on file  ?Intimate Partner Violence: Not At Risk  ? Fear of Current or Ex-Partner: No  ? Emotionally Abused: No  ? Physically Abused: No  ? Sexually Abused: No  ? ? ?FAMILY  HISTORY: ?Family History  ?Problem Relation Age of Onset  ? Breast cancer Sister   ? Hypertension Sister   ? Hypertension Brother   ? Breast cancer Daughter   ? ? ?ALLERGIES:  has No Known Allergies. ? ?MEDICATIONS:  ?Current Outpatient Medications  ?Medication Sig Dispense Refill  ? acetaminophen (TYLENOL) 325 MG tablet Take 650 mg by mouth every 6 (six) hours as needed for pain.    ? aspirin 81 MG tablet Take 81 mg by mouth daily.    ? chlorthalidone (HYGROTON) 25 MG tablet Take 1 tablet (25 mg total) by mouth daily. 90 tablet 3  ? Cholecalciferol (VITAMIN D3) 2000 UNITS TABS Take by mouth daily.    ? felodipine (PLENDIL) 10 MG 24 hr tablet Take 1 tablet (10 mg total) by mouth daily. 90 tablet 3  ? lisinopril (ZESTRIL) 20 MG tablet Take 2 tablets (40 mg total) by mouth daily. 180 tablet 3  ? lovastatin (MEVACOR) 40 MG tablet Take 1 tablet (40 mg total) by mouth daily. 90 tablet 3  ? naproxen (NAPROSYN) 500 MG tablet Take 500 mg by mouth 2 (two) times daily.    ? triamcinolone cream (KENALOG) 0.1 % APPLY TO ITCHY AREAS ON ARMS AND SCALP DAILY    ? vitamin B-12 (CYANOCOBALAMIN) 1000 MCG tablet Take 1,000 mcg by mouth daily.    ? ?No current facility-administered medications for this visit.  ? ? ?REVIEW OF SYSTEMS:   ?Review of Systems  ?Constitutional:  Negative for appetite change, fatigue and unexpected weight change.  ?Musculoskeletal:  Positive for arthralgias.  ?Neurological:  Negative for numbness.  ?Psychiatric/Behavioral:  Positive for depression.   ?All other systems reviewed and are negative. ? ? ?PHYSICAL EXAMINATION: ?ECOG PERFORMANCE STATUS: 1 - Symptomatic but completely ambulatory ? ?Vitals:  ? 01/26/22 1020  ?BP: (!) 147/78  ?Pulse: 89  ?Resp: 17  ?Temp: 98.9 ?F (37.2 ?C)  ?SpO2: 98%  ? ?Filed Weights  ? 01/26/22 1020  ?Weight: 281 lb 12.8 oz (127.8 kg)  ? ?Physical Exam ?Vitals reviewed.  ?Constitutional:   ?   Appearance: Normal appearance. He is obese.  ?Cardiovascular:  ?   Rate and Rhythm:  Normal rate and regular rhythm.  ?   Pulses: Normal pulses.  ?   Heart sounds: Normal heart sounds.  ?Pulmonary:  ?   Effort: Pulmonary effort is normal.  ?   Breath sounds: Normal breath sounds.  ?Abdominal:  ?   Palpations: Abdomen is soft. There is no hepatomegaly, splenomegaly or mass.  ?   Tenderness: There is no abdominal tenderness.  ?Musculoskeletal:  ?   Right lower leg: No edema.  ?   Left lower leg: No edema.  ?Lymphadenopathy:  ?   Cervical: No cervical adenopathy.  ?   Right cervical: No superficial, deep or posterior cervical adenopathy. ?   Left cervical: No superficial, deep or posterior cervical adenopathy.  ?  Upper Body:  ?   Right upper body: No supraclavicular or axillary adenopathy.  ?   Left upper body: No supraclavicular or axillary adenopathy.  ?   Lower Body: No right inguinal adenopathy. No left inguinal adenopathy.  ?Neurological:  ?   General: No focal deficit present.  ?   Mental Status: He is alert and oriented to person, place, and time.  ?Psychiatric:     ?   Mood and Affect: Mood normal.     ?   Behavior: Behavior normal.  ? ? ? ?LABORATORY DATA:  ?I have reviewed the data as listed ?Recent Results (from the past 2160 hour(s))  ?CBC w/Diff     Status: Abnormal  ? Collection Time: 12/11/21  8:39 AM  ?Result Value Ref Range  ? WBC 8.7 4.0 - 10.5 K/uL  ? RBC 4.32 4.22 - 5.81 Mil/uL  ? Hemoglobin 11.7 (L) 13.0 - 17.0 g/dL  ? HCT 35.7 (L) 39.0 - 52.0 %  ? MCV 82.6 78.0 - 100.0 fl  ? MCHC 32.9 30.0 - 36.0 g/dL  ? RDW 15.1 11.5 - 15.5 %  ? Platelets 165.0 150.0 - 400.0 K/uL  ? Neutrophils Relative % 63.5 43.0 - 77.0 %  ? Lymphocytes Relative 23.6 12.0 - 46.0 %  ? Monocytes Relative 10.6 3.0 - 12.0 %  ? Eosinophils Relative 1.8 0.0 - 5.0 %  ? Basophils Relative 0.5 0.0 - 3.0 %  ? Neutro Abs 5.5 1.4 - 7.7 K/uL  ? Lymphs Abs 2.0 0.7 - 4.0 K/uL  ? Monocytes Absolute 0.9 0.1 - 1.0 K/uL  ? Eosinophils Absolute 0.2 0.0 - 0.7 K/uL  ? Basophils Absolute 0.0 0.0 - 0.1 K/uL  ? ? ?RADIOGRAPHIC  STUDIES: ?I have personally reviewed the radiological images as listed and agreed with the findings in the report. ?No results found. ? ?ASSESSMENT:  ?Elevated ferritin: ?- Patient evaluated for elevated ferritin since 2017 (rang

## 2022-01-27 LAB — COPPER, SERUM: Copper: 142 ug/dL — ABNORMAL HIGH (ref 69–132)

## 2022-01-27 LAB — RHEUMATOID FACTOR: Rheumatoid fact SerPl-aCnc: <10 IU/mL (ref <14.0)

## 2022-01-29 LAB — PROTEIN ELECTROPHORESIS, SERUM
A/G Ratio: 0.9 (ref 0.7–1.7)
Albumin ELP: 3.6 g/dL (ref 2.9–4.4)
Alpha-1-Globulin: 0.3 g/dL (ref 0.0–0.4)
Alpha-2-Globulin: 0.9 g/dL (ref 0.4–1.0)
Beta Globulin: 1 g/dL (ref 0.7–1.3)
Gamma Globulin: 1.7 g/dL (ref 0.4–1.8)
Globulin, Total: 3.9 g/dL (ref 2.2–3.9)
Total Protein ELP: 7.5 g/dL (ref 6.0–8.5)

## 2022-01-29 LAB — METHYLMALONIC ACID, SERUM: Methylmalonic Acid, Quantitative: 148 nmol/L (ref 0–378)

## 2022-01-29 LAB — ANTINUCLEAR ANTIBODIES, IFA: ANA Ab, IFA: NEGATIVE

## 2022-02-01 DIAGNOSIS — G5750 Tarsal tunnel syndrome, unspecified lower limb: Secondary | ICD-10-CM | POA: Diagnosis not present

## 2022-02-01 DIAGNOSIS — M25579 Pain in unspecified ankle and joints of unspecified foot: Secondary | ICD-10-CM | POA: Diagnosis not present

## 2022-02-01 DIAGNOSIS — M79672 Pain in left foot: Secondary | ICD-10-CM | POA: Diagnosis not present

## 2022-02-05 LAB — HEMOCHROMATOSIS DNA-PCR(C282Y,H63D)

## 2022-02-19 NOTE — Progress Notes (Signed)
? ?[facility] ?[address] ?[place], [place] [address] ? ? ?CLINIC:  ?Medical Oncology/Hematology ? ?PCP:  ?Einar Pheasant, MD ?[address] ?[place] [address] ?[phone] ? ? ?REASON FOR VISIT:  ?Follow-up for elevated ferritin ? ?PRIOR THERAPY: None ? ?CURRENT THERAPY: Under work-up ? ?INTERVAL HISTORY:  ?[name] 71 y.o. male returns for routine follow-up of his elevated ferritin.  He was seen for initial consultation by Dr. Delton Coombes on 01/26/2022. ? ?At today's visit, he reports feeling well.  He has not had any changes in his health status since his initial visit 3 weeks ago. ? ?He continues to report chronic joint pain secondary to arthritis.  He denies any abdominal pain or abnormal fatigue.  He does not take any iron supplement at home. ? ?He has 80% energy and 100% appetite. He endorses that he is maintaining a stable weight. ? ? ?REVIEW OF SYSTEMS:  ?Review of Systems  ?Constitutional:  Negative for appetite change, chills, diaphoresis, fatigue, fever and unexpected weight change.  ?HENT:   Negative for lump/mass and nosebleeds.   ?Eyes:  Negative for eye problems.  ?Respiratory:  Negative for cough, hemoptysis and shortness of breath.   ?Cardiovascular:  Negative for chest pain, leg swelling and palpitations.  ?Gastrointestinal:  Negative for abdominal pain, blood in stool, constipation, diarrhea, nausea and vomiting.  ?Genitourinary:  Negative for hematuria.   ?Musculoskeletal:  Positive for back pain.  ?Skin: Negative.   ?Neurological:  Negative for dizziness, headaches and light-headedness.  ?Hematological:  Does not bruise/bleed easily.  ?Psychiatric/Behavioral:  Positive for depression.    ? ? ?PAST MEDICAL/SURGICAL HISTORY:  ?Past Medical History:  ?Diagnosis Date  ? Arthritis   ? History of chicken pox   ? Hypertension   ? Obesity   ? Peripheral vascular disease ([place])   ? Peripheral vascular disease ([place])   ? ?Past Surgical History:  ?Procedure  Laterality Date  ? CIRCUMCISION    ? COLONOSCOPY WITH PROPOFOL N/A 04/08/2015  ? Procedure: COLONOSCOPY WITH PROPOFOL;  Surgeon: Lollie Sails, MD;  Location: [facility] ENDOSCOPY;  Service: Endoscopy;  Laterality: N/A;  ? ESOPHAGOGASTRODUODENOSCOPY N/A 04/08/2015  ? Procedure: ESOPHAGOGASTRODUODENOSCOPY (EGD);  Surgeon: Lollie Sails, MD;  Location: [facility] ENDOSCOPY;  Service: Endoscopy;  Laterality: N/A;  ? FEMORAL-POPLITEAL BYPASS GRAFT Bilateral 1997, 2000  ? leg stent    ? pvd  ? ? ? ?SOCIAL HISTORY:  ?Social History  ? ?Socioeconomic History  ? Marital status: Legally Separated  ?  Spouse name: Not on file  ? Number of children: Not on file  ? Years of education: Not on file  ? Highest education level: Not on file  ?Occupational History  ? Not on file  ?Tobacco Use  ? Smoking status: Former  ? Smokeless tobacco: Never  ?Vaping Use  ? Vaping Use: Never used  ?Substance and Sexual Activity  ? Alcohol use: Yes  ?  Alcohol/week: 0.0 standard drinks  ? Drug use: No  ? Sexual activity: Not on file  ?Other Topics Concern  ? Not on file  ?Social History Narrative  ? Not on file  ? ?Social Determinants of Health  ? ?Financial Resource Strain: Low Risk   ? Difficulty of Paying Living Expenses: Not hard at all  ?Food Insecurity: No Food Insecurity  ? Worried About Charity fundraiser in the Last Year: Never true  ? Ran Out of Food in the Last Year: Never true  ?Transportation Needs: No Transportation Needs  ?  Lack of Transportation (Medical): No  ? Lack of Transportation (Non-Medical): No  ?Physical Activity: Insufficiently Active  ? Days of Exercise per Week: 3 days  ? Minutes of Exercise per Session: 20 min  ?Stress: Not on file  ?Social Connections: Unknown  ? Frequency of Communication with Friends and Family: More than three times a week  ? Frequency of Social Gatherings with Friends and Family: More than three times a week  ? Attends Religious Services: Not on file  ? Active Member of Clubs or Organizations: Not on  file  ? Attends Archivist Meetings: Not on file  ? Marital Status: Not on file  ?Intimate Partner Violence: Not At Risk  ? Fear of Current or Ex-Partner: No  ? Emotionally Abused: No  ? Physically Abused: No  ? Sexually Abused: No  ? ? ?FAMILY HISTORY:  ?Family History  ?Problem Relation Age of Onset  ? Breast cancer Sister   ? Hypertension Sister   ? Hypertension Brother   ? Breast cancer Daughter   ? ? ?CURRENT MEDICATIONS:  ?Outpatient Encounter Medications as of 02/20/2022  ?Medication Sig  ? acetaminophen (TYLENOL) 325 MG tablet Take 650 mg by mouth every 6 (six) hours as needed for pain.  ? aspirin 81 MG tablet Take 81 mg by mouth daily.  ? chlorthalidone (HYGROTON) 25 MG tablet Take 1 tablet (25 mg total) by mouth daily.  ? Cholecalciferol (VITAMIN D3) 2000 UNITS TABS Take by mouth daily.  ? felodipine (PLENDIL) 10 MG 24 hr tablet Take 1 tablet (10 mg total) by mouth daily.  ? lisinopril (ZESTRIL) 20 MG tablet Take 2 tablets (40 mg total) by mouth daily.  ? lovastatin (MEVACOR) 40 MG tablet Take 1 tablet (40 mg total) by mouth daily.  ? naproxen (NAPROSYN) 500 MG tablet Take 500 mg by mouth 2 (two) times daily.  ? triamcinolone cream (KENALOG) 0.1 % APPLY TO ITCHY AREAS ON ARMS AND SCALP DAILY  ? vitamin B-12 (CYANOCOBALAMIN) 1000 MCG tablet Take 1,000 mcg by mouth daily.  ? ?No facility-administered encounter medications on file as of 02/20/2022.  ? ? ?ALLERGIES:  ?No Known Allergies ? ? ?PHYSICAL EXAM:  ?ECOG PERFORMANCE STATUS: 0 - Asymptomatic ? ?There were no vitals filed for this visit. ?There were no vitals filed for this visit. ?Physical Exam ?Constitutional:   ?   Appearance: Normal appearance. He is obese.  ?HENT:  ?   Head: Normocephalic and atraumatic.  ?   Mouth/Throat:  ?   Mouth: Mucous membranes are moist.  ?Eyes:  ?   Extraocular Movements: Extraocular movements intact.  ?   Pupils: Pupils are equal, round, and reactive to light.  ?Cardiovascular:  ?   Rate and Rhythm: Normal rate  and regular rhythm.  ?   Pulses: Normal pulses.  ?   Heart sounds: Normal heart sounds.  ?Pulmonary:  ?   Effort: Pulmonary effort is normal.  ?   Breath sounds: Normal breath sounds.  ?Abdominal:  ?   General: Bowel sounds are normal.  ?   Palpations: Abdomen is soft.  ?   Tenderness: There is no abdominal tenderness.  ?Musculoskeletal:     ?   General: No swelling.  ?   Right lower leg: No edema.  ?   Left lower leg: No edema.  ?Lymphadenopathy:  ?   Cervical: No cervical adenopathy.  ?Skin: ?   General: Skin is warm and dry.  ?Neurological:  ?   General: No focal deficit present.  ?  Mental Status: He is alert and oriented to person, place, and time.  ?Psychiatric:     ?   Mood and Affect: Mood normal.     ?   Behavior: Behavior normal.  ? ? ? ?LABORATORY DATA:  ?I have reviewed the labs as listed.  ?CBC ?   ?Component Value Date/Time  ? WBC 7.7 01/26/2022 1119  ? RBC 4.33 01/26/2022 1119  ? RBC 4.38 01/26/2022 1119  ? HGB 11.6 (L) 01/26/2022 1119  ? HCT 35.4 (L) 01/26/2022 1119  ? PLT 232 01/26/2022 1119  ? MCV 81.8 01/26/2022 1119  ? MCH 26.8 01/26/2022 1119  ? MCHC 32.8 01/26/2022 1119  ? RDW 14.2 01/26/2022 1119  ? LYMPHSABS 2.0 01/26/2022 1119  ? MONOABS 0.7 01/26/2022 1119  ? EOSABS 0.2 01/26/2022 1119  ? BASOSABS 0.1 01/26/2022 1119  ? ? ?  Latest Ref Rng & Units 01/26/2022  ? 11:19 AM 10/17/2021  ? 12:09 PM 06/14/2021  ? 12:24 PM  ?CMP  ?Glucose 70 - 99 mg/dL 100   84   96    ?BUN 8 - 23 mg/dL '24   15   14    '$ ?Creatinine 0.61 - 1.24 mg/dL 1.16   1.01   0.92    ?Sodium 135 - 145 mmol/L 139   136   139    ?Potassium 3.5 - 5.1 mmol/L 3.6   4.0   3.8    ?Chloride 98 - 111 mmol/L 107   103   103    ?CO2 22 - 32 mmol/L '24   27   26    '$ ?Calcium 8.9 - 10.3 mg/dL 9.6   9.8   10.0    ?Total Protein 6.5 - 8.1 g/dL 8.5   7.2   7.3    ?Total Bilirubin 0.3 - 1.2 mg/dL 0.4   0.5   0.4    ?Alkaline Phos 38 - 126 U/L 59   63   58    ?AST 15 - 41 U/L '18   12   17    '$ ?ALT 0 - 44 U/L '15   11   13    '$ ? ? ?DIAGNOSTIC IMAGING:  ?I  have independently reviewed the relevant imaging and discussed with the patient. ? ?ASSESSMENT & PLAN: ?1.  Elevated ferritin: ?- Patient evaluated for elevated ferritin since at least 2017 (range 540-840).  [place]

## 2022-02-20 ENCOUNTER — Inpatient Hospital Stay (HOSPITAL_COMMUNITY): Payer: Medicare Other | Attending: Hematology | Admitting: Physician Assistant

## 2022-02-20 VITALS — BP 149/69 | HR 83 | Temp 98.7°F | Resp 18 | Ht 75.0 in | Wt 284.4 lb

## 2022-02-20 DIAGNOSIS — R7989 Other specified abnormal findings of blood chemistry: Secondary | ICD-10-CM | POA: Diagnosis not present

## 2022-02-20 DIAGNOSIS — Z87891 Personal history of nicotine dependence: Secondary | ICD-10-CM | POA: Diagnosis not present

## 2022-02-20 DIAGNOSIS — D649 Anemia, unspecified: Secondary | ICD-10-CM | POA: Diagnosis not present

## 2022-02-20 NOTE — Patient Instructions (Addendum)
[place] at [facility] ?Discharge Instructions ? ?You were seen today by Tarri Abernethy PA-C for your elevated ferritin (high iron levels) and your mild anemia (low red blood cells).  This test that we checked did not show any obvious cause of your high iron or your anemia.  However, your levels are relatively stable at this time and do not require immediate treatment.  We will repeat labs and see you for follow-up visit in 6 months. ? ?You can STOP taking your vitamin B12 supplement at this time, as your vitamin B12 levels are elevated. ? ?I would also like to check an abdominal ultrasound to see if you have any underlying liver disease that could be causing your iron levels to be high.  I will call you with these results. ? ?FOLLOW-UP APPOINTMENT: Labs and office visit in 6 months ? ? ?Thank you for choosing [place] at [facility] to provide your oncology and hematology care.  To afford each patient quality time with our provider, please arrive at least 15 minutes before your scheduled appointment time.  ? ?If you have a lab appointment with the [facility] please come in thru the Main Entrance and check in at the main information desk. ? ?You need to re-schedule your appointment should you arrive 10 or more minutes late.  We strive to give you quality time with our providers, and arriving late affects you and other patients whose appointments are after yours.  Also, if you no show three or more times for appointments you may be dismissed from the clinic at the providers discretion.     ?Again, thank you for choosing [facility].  Our hope is that these requests will decrease the amount of time that you wait before being seen by our physicians.       ?_____________________________________________________________ ? ?Should you have questions after your visit to [facility], please contact our office at [phone] and follow the  prompts.  Our office hours are 8:00 a.m. and 4:30 p.m. Monday - Friday.  Please note that voicemails left after 4:00 p.m. may not be returned until the following business day.  We are closed weekends and major holidays.  You do have access to a nurse 24-7, just call the main number to the clinic [phone] and do not press any options, hold on the line and a nurse will answer the phone.   ? ?For prescription refill requests, have your pharmacy contact our office and allow 72 hours.   ? ?Due to Covid, you will need to wear a mask upon entering the hospital. If you do not have a mask, a mask will be given to you at the Main Entrance upon arrival. For doctor visits, patients may have 1 support person age 37 or older with them. For treatment visits, patients can not have anyone with them due to social distancing guidelines and our immunocompromised population.  ? ? ? ?

## 2022-03-01 ENCOUNTER — Ambulatory Visit (HOSPITAL_COMMUNITY)
Admission: RE | Admit: 2022-03-01 | Discharge: 2022-03-01 | Disposition: A | Discharge status: Home / Self Care | Payer: Medicare Other | Source: Ambulatory Visit | Attending: Physician Assistant | Admitting: Physician Assistant

## 2022-03-01 DIAGNOSIS — R7989 Other specified abnormal findings of blood chemistry: Secondary | ICD-10-CM | POA: Insufficient documentation

## 2022-03-01 DIAGNOSIS — R945 Abnormal results of liver function studies: Secondary | ICD-10-CM | POA: Diagnosis not present

## 2022-03-01 NOTE — Progress Notes (Signed)
Called patient and made aware. He is agreeable with plan.

## 2022-03-08 DIAGNOSIS — M79672 Pain in left foot: Secondary | ICD-10-CM | POA: Diagnosis not present

## 2022-03-08 DIAGNOSIS — G5750 Tarsal tunnel syndrome, unspecified lower limb: Secondary | ICD-10-CM | POA: Diagnosis not present

## 2022-03-08 DIAGNOSIS — M25579 Pain in unspecified ankle and joints of unspecified foot: Secondary | ICD-10-CM | POA: Diagnosis not present

## 2022-03-19 ENCOUNTER — Encounter: Payer: Self-pay | Admitting: Internal Medicine

## 2022-03-19 ENCOUNTER — Ambulatory Visit (INDEPENDENT_AMBULATORY_CARE_PROVIDER_SITE_OTHER): Payer: Medicare Other | Admitting: Internal Medicine

## 2022-03-19 VITALS — BP 130/78 | HR 90 | Temp 97.9°F | Ht 75.0 in | Wt 284.0 lb

## 2022-03-19 DIAGNOSIS — D649 Anemia, unspecified: Secondary | ICD-10-CM

## 2022-03-19 DIAGNOSIS — I10 Essential (primary) hypertension: Secondary | ICD-10-CM | POA: Diagnosis not present

## 2022-03-19 DIAGNOSIS — R739 Hyperglycemia, unspecified: Secondary | ICD-10-CM | POA: Diagnosis not present

## 2022-03-19 DIAGNOSIS — Z86010 Personal history of colonic polyps: Secondary | ICD-10-CM

## 2022-03-19 DIAGNOSIS — E7800 Pure hypercholesterolemia, unspecified: Secondary | ICD-10-CM | POA: Diagnosis not present

## 2022-03-19 DIAGNOSIS — I70213 Atherosclerosis of native arteries of extremities with intermittent claudication, bilateral legs: Secondary | ICD-10-CM

## 2022-03-19 DIAGNOSIS — I739 Peripheral vascular disease, unspecified: Secondary | ICD-10-CM

## 2022-03-19 DIAGNOSIS — I25118 Atherosclerotic heart disease of native coronary artery with other forms of angina pectoris: Secondary | ICD-10-CM

## 2022-03-19 DIAGNOSIS — R7989 Other specified abnormal findings of blood chemistry: Secondary | ICD-10-CM

## 2022-03-19 LAB — BASIC METABOLIC PANEL
BUN: 24 mg/dL — ABNORMAL HIGH (ref 6–23)
CO2: 24 mEq/L (ref 19–32)
Calcium: 9.7 mg/dL (ref 8.4–10.5)
Chloride: 102 mEq/L (ref 96–112)
Creatinine, Ser: 1.11 mg/dL (ref 0.40–1.50)
GFR: 67.04 mL/min (ref >60.00)
Glucose, Bld: 91 mg/dL (ref 70–99)
Potassium: 4.1 mEq/L (ref 3.5–5.1)
Sodium: 139 mEq/L (ref 135–145)

## 2022-03-19 LAB — LIPID PANEL
Cholesterol: 122 mg/dL (ref 0–200)
HDL: 45.8 mg/dL (ref >39.00)
LDL Cholesterol: 54 mg/dL (ref 0–99)
NonHDL: 76.13
Total CHOL/HDL Ratio: 3
Triglycerides: 112 mg/dL (ref 0.0–149.0)
VLDL: 22.4 mg/dL (ref 0.0–40.0)

## 2022-03-19 LAB — TSH: TSH: 3.03 u[IU]/mL (ref 0.35–5.50)

## 2022-03-19 LAB — HEMOGLOBIN A1C: Hgb A1c MFr Bld: 5.9 % (ref 4.6–6.5)

## 2022-03-19 NOTE — Progress Notes (Signed)
Patient ID: Charles Nichols, male   DOB: 01-14-1951, 71 y.o.   MRN: [id number]   Subjective:    Patient ID: Charles Nichols, male    DOB: Oct 30, 1950, 71 y.o.   MRN: [id number]  Patient here for scheduled follow up.   Chief Complaint  Patient presents with   Follow-up    Jeani Hawking follow up   .   HPI Recently evaluated by oncology for elevated ferritin.  Does not take iron supplements.  Previously eating liver pudding.  Hemochromatosis mutational analysis negative. Recommended abdominal ultrasound and decrease intake of iron rich foods.  Question anemia of chronic disease. Saw Dr Gilda Crease - noninvasive studies do not suggest clinically significant change.  Recommended compression hose and continue antiplatelet therapy.  Recommended f/u in 6 months.  States he is doing well.  No chest pain or sob.  No acid reflux.  No abdominal pain.  Bowels moving.     Past Medical History:  Diagnosis Date   Arthritis    History of chicken pox    Hypertension    Obesity    Peripheral vascular disease (HCC)    Peripheral vascular disease (HCC)    Past Surgical History:  Procedure Laterality Date   CIRCUMCISION     COLONOSCOPY WITH PROPOFOL N/A 04/08/2015   Procedure: COLONOSCOPY WITH PROPOFOL;  Surgeon: Christena Deem, MD;  Location[facility] ENDOSCOPY;  Service: Endoscopy;  Laterality: N/A;   ESOPHAGOGASTRODUODENOSCOPY N/A 04/08/2015   Procedure: ESOPHAGOGASTRODUODENOSCOPY (EGD);  Surgeon: Christena Deem, MD;  Location: [facility] ENDOSCOPY;  Service: Endoscopy;  Laterality: N/A;   FEMORAL-POPLITEAL BYPASS GRAFT Bilateral 1997, 2000   leg stent     pvd   Family History  Problem Relation Age of Onset   Breast cancer Sister    Hypertension Sister    Hypertension Brother    Breast cancer Daughter    Social History   Socioeconomic History   Marital status: Legally Separated    Spouse name: Not on file   Number of children: Not on file   Years of education: Not on file   Highest education level: Not on  file  Occupational History   Not on file  Tobacco Use   Smoking status: Former   Smokeless tobacco: Never  Vaping Use   Vaping Use: Never used  Substance and Sexual Activity   Alcohol use: Yes    Alcohol/week: 0.0 standard drinks of alcohol   Drug use: No   Sexual activity: Not on file  Other Topics Concern   Not on file  Social History Narrative   Not on file   Social Determinants of Health   Financial Resource Strain: Low Risk  (04/03/2021)   Overall Financial Resource Strain (CARDIA)    Difficulty of Paying Living Expenses: Not hard at all  Food Insecurity: No Food Insecurity (04/03/2021)   Hunger Vital Sign    Worried About Running Out of Food in the Last Year: Never true    Ran Out of Food in the Last Year: Never true  Transportation Needs: No Transportation Needs (04/03/2021)   PRAPARE - Administrator, Civil Service (Medical): No    Lack of Transportation (Non-Medical): No  Physical Activity: Insufficiently Active (04/03/2021)   Exercise Vital Sign    Days of Exercise per Week: 3 days    Minutes of Exercise per Session: 20 min  Stress: No Stress Concern Present (03/30/2019)   [employer] of Occupational Health - Occupational Stress Questionnaire    Feeling  of Stress : Only a little  Social Connections: Unknown (04/03/2021)   Social Connection and Isolation Panel [NHANES]    Frequency of Communication with Friends and Family: More than three times a week    Frequency of Social Gatherings with Friends and Family: More than three times a week    Attends Religious Services: Not on Marketing executive or Organizations: Not on file    Attends Banker Meetings: Not on file    Marital Status: Not on file     Review of Systems  Constitutional:  Negative for appetite change and unexpected weight change.  HENT:  Negative for congestion and sinus pressure.   Respiratory:  Negative for cough, chest tightness and shortness of breath.    Cardiovascular:  Negative for chest pain, palpitations and leg swelling.  Gastrointestinal:  Negative for abdominal pain, diarrhea, nausea and vomiting.  Genitourinary:  Negative for difficulty urinating and dysuria.  Musculoskeletal:  Negative for joint swelling and myalgias.  Skin:  Negative for color change and rash.  Neurological:  Negative for dizziness, light-headedness and headaches.  Psychiatric/Behavioral:  Negative for agitation and dysphoric mood.        Objective:     BP 130/78   Pulse 90   Temp 97.9 F (36.6 C) (Oral)   Ht 6\' 3"  (1.905 m)   Wt 284 lb (128.8 kg)   SpO2 98%   BMI 35.50 kg/m  Wt Readings from Last 3 Encounters:  03/19/22 284 lb (128.8 kg)  02/20/22 284 lb 6.4 oz (129 kg)  01/26/22 281 lb 12.8 oz (127.8 kg)    Physical Exam Constitutional:      General: He is not in acute distress.    Appearance: Normal appearance. He is well-developed.  HENT:     Head: Normocephalic and atraumatic.     Right Ear: External ear normal.     Left Ear: External ear normal.  Eyes:     General: No scleral icterus.       Right eye: No discharge.        Left eye: No discharge.  Cardiovascular:     Rate and Rhythm: Normal rate and regular rhythm.  Pulmonary:     Effort: Pulmonary effort is normal. No respiratory distress.     Breath sounds: Normal breath sounds.  Abdominal:     General: Bowel sounds are normal.     Palpations: Abdomen is soft.     Tenderness: There is no abdominal tenderness.  Musculoskeletal:        General: No swelling or tenderness.     Cervical back: Neck supple. No tenderness.  Lymphadenopathy:     Cervical: No cervical adenopathy.  Skin:    Findings: No erythema or rash.  Neurological:     Mental Status: He is alert.  Psychiatric:        Mood and Affect: Mood normal.        Behavior: Behavior normal.      Outpatient Encounter Medications as of 03/19/2022  Medication Sig   acetaminophen (TYLENOL) 325 MG tablet Take 650 mg by  mouth every 6 (six) hours as needed for pain.   aspirin 81 MG tablet Take 81 mg by mouth daily.   chlorthalidone (HYGROTON) 25 MG tablet Take 1 tablet (25 mg total) by mouth daily.   Cholecalciferol (VITAMIN D3) 2000 UNITS TABS Take by mouth daily.   felodipine (PLENDIL) 10 MG 24 hr tablet Take 1 tablet (10 mg total)  by mouth daily.   lisinopril (ZESTRIL) 20 MG tablet Take 2 tablets (40 mg total) by mouth daily.   lovastatin (MEVACOR) 40 MG tablet Take 1 tablet (40 mg total) by mouth daily.   naproxen (NAPROSYN) 500 MG tablet Take 500 mg by mouth 2 (two) times daily.   triamcinolone cream (KENALOG) 0.1 % APPLY TO ITCHY AREAS ON ARMS AND SCALP DAILY   ammonium lactate (AMLACTIN) 12 % cream Apply topically 2 (two) times daily.   [DISCONTINUED] vitamin B-12 (CYANOCOBALAMIN) 1000 MCG tablet Take 1,000 mcg by mouth daily. (Patient not taking: Reported on 03/19/2022)   No facility-administered encounter medications on file as of 03/19/2022.     Lab Results  Component Value Date   WBC 7.7 01/26/2022   HGB 11.6 (L) 01/26/2022   HCT 35.4 (L) 01/26/2022   PLT 232 01/26/2022   GLUCOSE 91 03/19/2022   CHOL 122 03/19/2022   TRIG 112.0 03/19/2022   HDL 45.80 03/19/2022   LDLDIRECT 78.0 06/14/2021   LDLCALC 54 03/19/2022   ALT 15 01/26/2022   AST 18 01/26/2022   NA 139 03/19/2022   K 4.1 03/19/2022   CL 102 03/19/2022   CREATININE 1.11 03/19/2022   BUN 24 (H) 03/19/2022   CO2 24 03/19/2022   TSH 3.03 03/19/2022   PSA 1.76 06/14/2021   HGBA1C 5.9 03/19/2022    US Abdomen Limited RUQ (LIVER/GB)  Result Date: 03/01/2022 CLINICAL DATA:  Abnormal LFTs.  Elevated ferritin. EXAM: ULTRASOUND ABDOMEN LIMITED RIGHT UPPER QUADRANT COMPARISON:  None Available. FINDINGS: Gallbladder: Gallbladder is mildly contracted with wall thickness measuring 5 mm. Negative sonographic Murphy's sign. No cholelithiasis. Common bile duct: Diameter: 3 mm Liver: Increased echogenicity. No focal lesion. Portal vein is  patent on color Doppler imaging with normal direction of blood flow towards the liver. Other: None. IMPRESSION: Increased hepatic parenchymal echogenicity suggestive of steatosis. Mildly contracted gallbladder without cholelithiasis. Electronically Signed   By: Annia Belt M.D.   On: 03/01/2022 13:32       Assessment & Plan:   Problem List Items Addressed This Visit     Anemia    Saw hematology.  Felt to be possibly anemia of chronic disease.  Follow.        Atherosclerosis of native arteries of extremity with intermittent claudication [facility])    Seeing Dr Gilda Crease as outlined.  Stable.       Benign essential hypertension - Primary    Blood pressure as outlined.  Continue lisinopril, chlorthalidone and plendil.  Follow pressures.  Follow metabolic panel.        Relevant Orders   Basic metabolic panel (Completed)   CAD (coronary artery disease)    No chest pain.  Continue risk factor modification.       Elevated ferritin    Seeing hematology.  W/up in progress as outlined.  Decreased liver rich foods.  No iron supplements.        History of colonic polyps    Colonoscopy 06/06/20 - one 4mm polyp (transverse colon), one 2mm polyp in the sigmoid colon and one 3mm polyp in the sigmoid colon, diverticulosis.  Per pt, scheduled for f/u colonoscopy.  Recommended f/u colonoscopy in 2 years.       Hypercholesterolemia    On lovastatin.  Low cholesterol diet and exercise.  Follow lipid panel and liver function tests.        Relevant Orders   Lipid panel (Completed)   TSH (Completed)   Hyperglycemia    Low carb and  exercise.  Follow met b and a1c.        Relevant Orders   Hemoglobin A1c (Completed)   Peripheral vascular disease (HCC)    Has seen [facility].  Continue risk factor modification.  No pain.  Follow.          Dale Ghent, MD

## 2022-03-20 DIAGNOSIS — I781 Nevus, non-neoplastic: Secondary | ICD-10-CM | POA: Diagnosis not present

## 2022-03-20 DIAGNOSIS — L280 Lichen simplex chronicus: Secondary | ICD-10-CM | POA: Diagnosis not present

## 2022-04-01 ENCOUNTER — Encounter: Payer: Self-pay | Admitting: Internal Medicine

## 2022-04-01 NOTE — Assessment & Plan Note (Signed)
Saw hematology.  Felt to be possibly anemia of chronic disease.  Follow.

## 2022-04-02 ENCOUNTER — Telehealth: Payer: Self-pay | Admitting: Internal Medicine

## 2022-04-04 ENCOUNTER — Ambulatory Visit (INDEPENDENT_AMBULATORY_CARE_PROVIDER_SITE_OTHER): Payer: Medicare Other

## 2022-04-04 VITALS — Ht 75.0 in | Wt 284.0 lb

## 2022-04-04 DIAGNOSIS — Z0000 Encounter for general adult medical examination without abnormal findings: Secondary | ICD-10-CM

## 2022-04-04 NOTE — Patient Instructions (Addendum)
  Charles Nichols , Thank you for taking time to come for your Medicare Wellness Visit. I appreciate your ongoing commitment to your health goals. Please review the following plan we discussed and let me know if I can assist you in the future.   These are the goals we discussed:  Goals       Patient Stated     Follow up with Primary Care Provider (pt-stated)      As needed       Increase activity (pt-stated)      Stay active, as tolerated        This is a list of the screening recommended for you and due dates:  Health Maintenance  Topic Date Due   COVID-19 Vaccine (5 - Booster for Moderna series) 04/04/2022*   Flu Shot  05/08/2022   Colon Cancer Screening  06/06/2025   Tetanus Vaccine  04/02/2028   Pneumonia Vaccine  Completed   Hepatitis C Screening: USPSTF Recommendation to screen - Ages 18-79 yo.  Completed   Zoster (Shingles) Vaccine  Completed   HPV Vaccine  Aged Out  *Topic was postponed. The date shown is not the original due date.

## 2022-04-04 NOTE — Progress Notes (Signed)
Subjective:   Charles Nichols is a 71 y.o. male who presents for Medicare Annual/Subsequent preventive examination.  Review of Systems    No ROS.  Medicare Wellness Virtual Visit.  Visual/audio telehealth visit, UTA vital signs.   See social history for additional risk factors.   Cardiac Risk Factors include: advanced age (>16mn, >>24women);male gender     Objective:    Today's Vitals   04/04/22 1311  Weight: 284 lb (128.8 kg)  Height: '6\' 3"'$  (1.905 m)   Body mass index is 35.5 kg/m.     04/04/2022    1:17 PM 02/20/2022    8:11 AM 01/26/2022   10:18 AM 04/03/2021   12:59 PM 03/31/2020   12:41 PM 03/30/2019    2:11 PM 03/27/2018    2:09 PM  Advanced Directives  Does Patient Have a Medical Advance Directive? No No No No No No No  Would patient like information on creating a medical advance directive? No - Patient declined No - Patient declined No - Patient declined No - Patient declined No - Patient declined Yes (MAU/Ambulatory/Procedural Areas - Information given) Yes (MAU/Ambulatory/Procedural Areas - Information given)    Current Medications (verified) Outpatient Encounter Medications as of 04/04/2022  Medication Sig   acetaminophen (TYLENOL) 325 MG tablet Take 650 mg by mouth every 6 (six) hours as needed for pain.   ammonium lactate (AMLACTIN) 12 % cream Apply topically 2 (two) times daily.   aspirin 81 MG tablet Take 81 mg by mouth daily.   chlorthalidone (HYGROTON) 25 MG tablet Take 1 tablet (25 mg total) by mouth daily.   Cholecalciferol (VITAMIN D3) 2000 UNITS TABS Take by mouth daily.   felodipine (PLENDIL) 10 MG 24 hr tablet Take 1 tablet (10 mg total) by mouth daily.   lisinopril (ZESTRIL) 20 MG tablet Take 2 tablets (40 mg total) by mouth daily.   lovastatin (MEVACOR) 40 MG tablet Take 1 tablet (40 mg total) by mouth daily.   naproxen (NAPROSYN) 500 MG tablet Take 500 mg by mouth 2 (two) times daily.   triamcinolone cream (KENALOG) 0.1 % APPLY TO ITCHY AREAS ON  ARMS AND SCALP DAILY   No facility-administered encounter medications on file as of 04/04/2022.    Allergies (verified) Patient has no known allergies.   History: Past Medical History:  Diagnosis Date   Arthritis    History of chicken pox    Hypertension    Obesity    Peripheral vascular disease ([place]    [place]ipheral vascular disease ([place]    Past Surgical History:  Procedure Laterality Date   CIRCUMCISION     COLONOSCOPY WITH PROPOFOL N/A 04/08/2015   Procedure: COLONOSCOPY WITH PROPOFOL;  Surgeon: MLollie Sails MD;  [facility];  Service: Endoscopy;  Laterality: N/A;   ESOPHAGOGASTRODUODENOSCOPY N/A 04/08/2015   Procedure: ESOPHAGOGASTRODUODENOSCOPY (EGD);  Surgeon: MLollie Sails MD;  [facility];  Service: Endoscopy;  Laterality: N/A;   FEMORAL-POPLITEAL BYPASS GRAFT Bilateral 1997, 2000   leg stent     pvd   Family History  Problem Relation Age of Onset   Breast cancer Sister    Hypertension Sister    Hypertension Brother    Breast cancer Daughter    Social History   Socioeconomic History   Marital status: Legally Separated    Spouse name: Not on file   Number of children: Not on file   Years of education: Not on file   Highest education level: Not on file  Occupational  History   Not on file  Tobacco Use   Smoking status: Former   Smokeless tobacco: Never  Vaping Use   Vaping Use: Never used  Substance and Sexual Activity   Alcohol use: Yes    Alcohol/week: 0.0 standard drinks of alcohol   Drug use: No   Sexual activity: Not on file  Other Topics Concern   Not on file  Social History Narrative   Not on file   Social Determinants of Health   Financial Resource Strain: Low Risk  (04/04/2022)   Overall Financial Resource Strain (CARDIA)    Difficulty of Paying Living Expenses: Not hard at all  Food Insecurity: No Food Insecurity (04/04/2022)   Hunger Vital Sign    Worried About Running Out of Food in the Last Year: Never  true    Ran Out of Food in the Last Year: Never true  Transportation Needs: No Transportation Needs (04/04/2022)   PRAPARE - Hydrologist (Medical): No    Lack of Transportation (Non-Medical): No  Physical Activity: Insufficiently Active (04/04/2022)   Exercise Vital Sign    Days of Exercise per Week: 3 days    Minutes of Exercise per Session: 20 min  Stress: No Stress Concern Present (04/04/2022)   [place]    Feeling of Stress : Not at all  Social Connections: Unknown (04/04/2022)   Social Connection and Isolation Panel [NHANES]    Frequency of Communication with Friends and Family: More than three times a week    Frequency of Social Gatherings with Friends and Family: More than three times a week    Attends Religious Services: Not on Advertising copywriter or Organizations: Not on file    Attends Archivist Meetings: Not on file    Marital Status: Not on file    Tobacco Counseling Counseling given: Not Answered   Clinical Intake:  Pre-visit preparation completed: Yes        Diabetes: No  How often do you need to have someone help you when you read instructions, pamphlets, or other written materials from your doctor or pharmacy?: 1 - Never   Interpreter Needed?: No      Activities of Daily Living    04/04/2022    1:21 PM  In your present state of health, do you have any difficulty performing the following activities:  Hearing? 0  Vision? 0  Difficulty concentrating or making decisions? 0  Walking or climbing stairs? 1  Comment Chronic L knee pain. Drop foot R foot. Cane/walker in use.  Dressing or bathing? 0  Doing errands, shopping? 0  Preparing Food and eating ? N  Using the Toilet? N  In the past six months, have you accidently leaked urine? N  Do you have problems with loss of bowel control? N  Managing your Medications? N  Managing your  Finances? N  Housekeeping or managing your Housekeeping? N    Patient Care Team: Einar Pheasant, MD as PCP - General (Internal Medicine) Derek Jack, MD as Medical Oncologist (Hematology)  Indicate any recent Medical Services you may have received from other than [facility] providers in the past year (date may be approximate).     Assessment:   This is a routine wellness examination for Charles Nichols.  Virtual Visit via Telephone Note  I connected with  Charles Nichols on 04/04/22 at  1:00 PM EDT by telephone and verified that  I am speaking with the correct person using two identifiers.  Persons participating in the virtual visit: patient/Nurse Health Advisor   I discussed the limitations of performing an evaluation and management service by telehealth. We continued and completed visit with audio only. Some vital signs may be absent or patient reported.   Hearing/Vision screen Hearing Screening - Comments:: Patient is able to hear conversational tones without difficulty. No issues reported. Vision Screening - Comments:: Followed by [facility]  Wears corrective lenses when reading They have seen their ophthalmologist in the last 12 months.   Dietary issues and exercise activities discussed: Current Exercise Habits: Home exercise routine, Type of exercise: walking, Intensity: Mild Regular diet   Goals Addressed               This Visit's Progress     Patient Stated     Increase activity (pt-stated)        Stay active, as tolerated       Depression Screen    04/04/2022    1:14 PM 03/19/2022   11:39 AM 10/17/2021   11:40 AM 04/03/2021   12:36 PM 06/21/2020   11:36 AM 03/31/2020   12:46 PM 03/30/2019    2:12 PM  PHQ 2/9 Scores  PHQ - 2 Score 0 0 0 0 0 0 0    Fall Risk    04/04/2022    1:19 PM 03/19/2022   11:39 AM 10/17/2021   11:40 AM 04/03/2021   12:41 PM 06/21/2020   11:36 AM  Chance in the past year? 0 0 0 0 0  Number falls in past yr:  0 0 0 0   Injury with Fall?  0 0 0 0  Risk for fall due to :  No Fall Risks     Follow up Falls evaluation completed Falls evaluation completed Falls evaluation completed Falls evaluation completed Falls evaluation completed    [place]: Home free of loose throw rugs in walkways, pet beds, electrical cords, etc? Yes  Adequate lighting in your home to reduce risk of falls? Yes   ASSISTIVE DEVICES UTILIZED TO PREVENT FALLS: Life alert? No  Use of a cane, walker or w/c? Yes , cane and walker as needed.   TIMED UP AND GO: Was the test performed? No .   Cognitive Function: Patient is alert and oriented x3.      03/27/2018    2:17 PM  MMSE - Mini Mental State Exam  Orientation to time 5  Orientation to Place 5  Registration 3  Attention/ Calculation 5  Recall 3  Language- name 2 objects 2  Language- repeat 1  Language- follow 3 step command 3  Language- read & follow direction 1  Write a sentence 1  Copy design 1  Total score 30        04/03/2021    1:18 PM 03/31/2020    1:01 PM 03/30/2019    2:18 PM  6CIT Screen  What Year? 0 points 0 points 0 points  What month? 0 points 0 points 0 points  What time? 0 points  0 points  Count back from 20 0 points  0 points  Months in reverse 0 points 0 points 0 points  Repeat phrase  0 points 0 points  Total Score   0 points    Immunizations Immunization History  Administered Date(s) Administered   Fluad Quad(high Dose 65+) 08/03/2019, 06/21/2020, 06/14/2021   Influenza  Split 06/29/2013, 09/21/2014   Influenza, High Dose Seasonal PF 08/22/2016, 08/22/2017, 09/02/2018   Influenza-Unspecified 07/03/2015   Moderna Sars-Covid-2 Vaccination 11/19/2019, 12/21/2019, 10/15/2020, 03/29/2021   Pneumococcal Conjugate-13 04/12/2017   Pneumococcal Polysaccharide-23 12/03/2018   Tdap 04/02/2018   Zoster Recombinat (Shingrix) 09/14/2021, 01/13/2022   Zoster, Live 11/02/2014   Screening Tests Health Maintenance   Topic Date Due   COVID-19 Vaccine (5 - Booster for Moderna series) 04/04/2022 (Originally 05/24/2021)   INFLUENZA VACCINE  05/08/2022   COLONOSCOPY (Pts 45-33yr Insurance coverage will need to be confirmed)  06/06/2025   TETANUS/TDAP  04/02/2028   Pneumonia Vaccine 71 Years old  Completed   Hepatitis C Screening  Completed   Zoster Vaccines- Shingrix  Completed   HPV VACCINES  Aged Out   Health Maintenance There are no preventive care reminders to display for this patient.  Lung Cancer Screening: (Low Dose CT Chest recommended if Age 71-80years, 30 pack-year currently smoking OR have quit w/in 15years.) does not qualify.   Vision Screening: Recommended annual ophthalmology exams for early detection of glaucoma and other disorders of the eye.  Dental Screening: Recommended annual dental exams for proper oral hygiene  Community Resource Referral / Chronic Care Management: CRR required this visit?  No   CCM required this visit?  No      Plan:   Keep all routine maintenance appointments.   I have personally reviewed and noted the following in the patient's chart:   Medical and social history Use of alcohol, tobacco or illicit drugs  Current medications and supplements including opioid prescriptions. Patient is not currently taking opioid prescriptions. Functional ability and status Nutritional status Physical activity Advanced directives List of other physicians Hospitalizations, surgeries, and ER visits in previous 12 months Vitals Screenings to include cognitive, depression, and falls Referrals and appointments  In addition, I have reviewed and discussed with patient certain preventive protocols, quality metrics, and best practice recommendations. A written personalized care plan for preventive services as well as general preventive health recommendations were provided to patient.     OVarney Biles LPN   [id number]

## 2022-04-05 DIAGNOSIS — B353 Tinea pedis: Secondary | ICD-10-CM | POA: Diagnosis not present

## 2022-04-05 DIAGNOSIS — G5750 Tarsal tunnel syndrome, unspecified lower limb: Secondary | ICD-10-CM | POA: Diagnosis not present

## 2022-04-05 DIAGNOSIS — M79671 Pain in right foot: Secondary | ICD-10-CM | POA: Diagnosis not present

## 2022-04-05 DIAGNOSIS — M25579 Pain in unspecified ankle and joints of unspecified foot: Secondary | ICD-10-CM | POA: Diagnosis not present

## 2022-04-29 DIAGNOSIS — R404 Transient alteration of awareness: Secondary | ICD-10-CM | POA: Diagnosis not present

## 2022-04-29 DIAGNOSIS — R4182 Altered mental status, unspecified: Secondary | ICD-10-CM | POA: Diagnosis not present

## 2022-04-29 DIAGNOSIS — I10 Essential (primary) hypertension: Secondary | ICD-10-CM | POA: Diagnosis not present

## 2022-04-29 DIAGNOSIS — T510X1A Toxic effect of ethanol, accidental (unintentional), initial encounter: Secondary | ICD-10-CM | POA: Diagnosis not present

## 2022-04-29 DIAGNOSIS — I6523 Occlusion and stenosis of bilateral carotid arteries: Secondary | ICD-10-CM | POA: Diagnosis not present

## 2022-04-29 DIAGNOSIS — Z7982 Long term (current) use of aspirin: Secondary | ICD-10-CM | POA: Diagnosis not present

## 2022-04-29 DIAGNOSIS — Z87891 Personal history of nicotine dependence: Secondary | ICD-10-CM | POA: Diagnosis not present

## 2022-04-29 DIAGNOSIS — E785 Hyperlipidemia, unspecified: Secondary | ICD-10-CM | POA: Diagnosis not present

## 2022-04-29 DIAGNOSIS — R5381 Other malaise: Secondary | ICD-10-CM | POA: Diagnosis not present

## 2022-05-11 ENCOUNTER — Telehealth: Payer: Self-pay

## 2022-05-11 NOTE — Telephone Encounter (Signed)
Patient returned call from Kerin Salen, RN.  I let patient know that Dr. Einar Pheasant would like to have a copy of his records.  Patient states he was seen at a [facility] facility in [place].  I looked in his chart and I believe we have the notes from his visit to the [facility] Emergency Department on 04/29/2022 in [place].  Patient states he will plan to bring the paperwork they gave him to his next visit with Dr. Nicki Reaper.  I asked patient, per Dr. Nicki Reaper, if he was told to hold on driving or any other restrictions.  Patient states he was not given any restrictions at this time.
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
## 2022-05-11 NOTE — Telephone Encounter (Signed)
Left message to call office
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
## 2022-05-11 NOTE — Telephone Encounter (Signed)
Please call and confirm doing ok.  Need records from where he was seen.  Need to confirm what w/up and if was told to hold on driving or any other restrictions.

## 2022-05-12 ENCOUNTER — Encounter: Payer: Self-pay | Admitting: Internal Medicine

## 2022-05-12 DIAGNOSIS — M25569 Pain in unspecified knee: Secondary | ICD-10-CM | POA: Insufficient documentation

## 2022-05-22 ENCOUNTER — Encounter: Payer: Self-pay | Admitting: Internal Medicine

## 2022-05-22 ENCOUNTER — Ambulatory Visit (INDEPENDENT_AMBULATORY_CARE_PROVIDER_SITE_OTHER): Payer: Medicare Other | Admitting: Internal Medicine

## 2022-05-22 DIAGNOSIS — E876 Hypokalemia: Secondary | ICD-10-CM

## 2022-05-22 DIAGNOSIS — I10 Essential (primary) hypertension: Secondary | ICD-10-CM

## 2022-05-22 DIAGNOSIS — R748 Abnormal levels of other serum enzymes: Secondary | ICD-10-CM

## 2022-05-22 DIAGNOSIS — R4020 Unspecified coma: Secondary | ICD-10-CM

## 2022-05-22 DIAGNOSIS — I70213 Atherosclerosis of native arteries of extremities with intermittent claudication, bilateral legs: Secondary | ICD-10-CM

## 2022-05-22 DIAGNOSIS — D649 Anemia, unspecified: Secondary | ICD-10-CM

## 2022-05-22 DIAGNOSIS — I25118 Atherosclerotic heart disease of native coronary artery with other forms of angina pectoris: Secondary | ICD-10-CM

## 2022-05-22 DIAGNOSIS — I739 Peripheral vascular disease, unspecified: Secondary | ICD-10-CM

## 2022-05-22 DIAGNOSIS — Y908 Blood alcohol level of 240 mg/100 ml or more: Secondary | ICD-10-CM

## 2022-05-22 DIAGNOSIS — R4182 Altered mental status, unspecified: Secondary | ICD-10-CM

## 2022-05-22 NOTE — Progress Notes (Signed)
Patient ID: Charles Nichols, male   DOB: [birth year], 71 y.o.   MRN: [id number]   Subjective:    Patient ID: Charles Nichols, male    DOB: 03/22/1951, 71 y.o.   MRN: [id number]   Patient here for work in appt Chief Complaint  Patient presents with   Follow-up    ED follow up- syncope 04/29/22   .   HPI Work in today for ER follow up.  Was seen 04/29/22 - ER after being found by police officers with his car against a rail.  Per note review, in ER he was alert and oriented x 3.  Report was mental status at baseline.  W/up included a normal head CT. CTA (head and neck) - no emergent large vessel occlusion or hemodynamically significant stenosis of the head or neck.  Labs revealed decreased GFR, slightly decreased potassium and elevated ethanol level.  Reports he normally does not drink.  Was at his nephews house - outside.  Drank some "coolers" with alcohol.  States was out in the heat.  Had chicken wings and salad.  Remembers getting in his car and driving.  Does not remember ending up on the side of the road.  Does remember being in the ER.  No history of seizures or syncope.  No chest pain.  Tries to stay active.  No increased heart rate or palpitations.  No nausea or vomiting.  Bowels moving.  Brought in blood pressure readings from outside checks:  130s/80s.  States he feels fine.  Denies any other alcohol intake.     Past Medical History:  Diagnosis Date   Arthritis    History of chicken pox    Hypertension    Obesity    Peripheral vascular disease ([place])    Peripheral vascular disease ([place])    Past Surgical History:  Procedure Laterality Date   CIRCUMCISION     COLONOSCOPY WITH PROPOFOL N/A 04/08/2015   Procedure: COLONOSCOPY WITH PROPOFOL;  Surgeon: Lollie Sails, MD;  Location: [facility] ENDOSCOPY;  Service: Endoscopy;  Laterality: N/A;   ESOPHAGOGASTRODUODENOSCOPY N/A 04/08/2015   Procedure: ESOPHAGOGASTRODUODENOSCOPY (EGD);  Surgeon: Lollie Sails, MD;  Location: [facility] ENDOSCOPY;  Service:  Endoscopy;  Laterality: N/A;   FEMORAL-POPLITEAL BYPASS GRAFT Bilateral 1997, 2000   leg stent     pvd   Family History  Problem Relation Age of Onset   Breast cancer Sister    Hypertension Sister    Hypertension Brother    Breast cancer Daughter    Social History   Socioeconomic History   Marital status: Legally Separated    Spouse name: Not on file   Number of children: Not on file   Years of education: Not on file   Highest education level: Not on file  Occupational History   Not on file  Tobacco Use   Smoking status: Former   Smokeless tobacco: Never  Vaping Use   Vaping Use: Never used  Substance and Sexual Activity   Alcohol use: Yes    Alcohol/week: 0.0 standard drinks of alcohol   Drug use: No   Sexual activity: Not on file  Other Topics Concern   Not on file  Social History Narrative   Not on file   Social Determinants of Health   Financial Resource Strain: Low Risk  (04/04/2022)   Overall Financial Resource Strain (CARDIA)    Difficulty of Paying Living Expenses: Not hard at all  Food Insecurity: No Food Insecurity (04/04/2022)   Hunger Vital Sign  Worried About Charity fundraiser in the Last Year: Never true    [place] in the Last Year: Never true  Transportation Needs: No Transportation Needs (04/04/2022)   PRAPARE - Hydrologist (Medical): No    Lack of Transportation (Non-Medical): No  Physical Activity: Insufficiently Active (04/04/2022)   Exercise Vital Sign    Days of Exercise per Week: 3 days    Minutes of Exercise per Session: 20 min  Stress: No Stress Concern Present (04/04/2022)   [place]    Feeling of Stress : Not at all  Social Connections: Unknown (04/04/2022)   Social Connection and Isolation Panel [NHANES]    Frequency of Communication with Friends and Family: More than three times a week    Frequency of Social Gatherings with  Friends and Family: More than three times a week    Attends Religious Services: Not on Advertising copywriter or Organizations: Not on file    Attends Archivist Meetings: Not on file    Marital Status: Not on file     Review of Systems  Constitutional:  Negative for appetite change and unexpected weight change.  HENT:  Negative for congestion and sinus pressure.   Respiratory:  Negative for cough, chest tightness and shortness of breath.   Cardiovascular:  Negative for chest pain, palpitations and leg swelling.  Gastrointestinal:  Negative for abdominal pain, diarrhea, nausea and vomiting.  Genitourinary:  Negative for difficulty urinating and dysuria.  Musculoskeletal:  Negative for joint swelling and myalgias.  Skin:  Negative for color change and rash.  Neurological:  Negative for dizziness, light-headedness and headaches.  Psychiatric/Behavioral:  Negative for agitation and dysphoric mood.        Objective:     BP (!) 142/80 (BP Location: Left Arm, Patient Position: Sitting, Cuff Size: Large)   Pulse (!) 101   Temp 97.9 F (36.6 C) (Oral)   Resp 16   Ht '6\' 3"'$  (1.905 m)   Wt 279 lb 3.2 oz (126.6 kg)   SpO2 99%   BMI 34.90 kg/m  Wt Readings from Last 3 Encounters:  05/22/22 279 lb 3.2 oz (126.6 kg)  04/04/22 284 lb (128.8 kg)  03/19/22 284 lb (128.8 kg)    Physical Exam Vitals reviewed.  Constitutional:      General: He is not in acute distress.    Appearance: Normal appearance. He is well-developed.  HENT:     Head: Normocephalic and atraumatic.     Right Ear: External ear normal.     Left Ear: External ear normal.  Eyes:     General: No scleral icterus.       Right eye: No discharge.        Left eye: No discharge.     Conjunctiva/sclera: Conjunctivae normal.  Cardiovascular:     Rate and Rhythm: Normal rate and regular rhythm.  Pulmonary:     Effort: Pulmonary effort is normal. No respiratory distress.     Breath sounds: Normal breath  sounds.  Abdominal:     General: Bowel sounds are normal.     Palpations: Abdomen is soft.     Tenderness: There is no abdominal tenderness.  Musculoskeletal:        General: No swelling or tenderness.     Cervical back: Neck supple. No tenderness.  Lymphadenopathy:     Cervical: No cervical adenopathy.  Skin:  Findings: No erythema or rash.  Neurological:     Mental Status: He is alert.  Psychiatric:        Mood and Affect: Mood normal.        Behavior: Behavior normal.      Outpatient Encounter Medications as of 05/22/2022  Medication Sig   acetaminophen (TYLENOL) 325 MG tablet Take 650 mg by mouth every 6 (six) hours as needed for pain.   ammonium lactate (AMLACTIN) 12 % cream Apply topically 2 (two) times daily.   aspirin 81 MG tablet Take 81 mg by mouth daily.   chlorthalidone (HYGROTON) 25 MG tablet Take 1 tablet (25 mg total) by mouth daily.   Cholecalciferol (VITAMIN D3) 2000 UNITS TABS Take by mouth daily.   felodipine (PLENDIL) 10 MG 24 hr tablet Take 1 tablet (10 mg total) by mouth daily.   lisinopril (ZESTRIL) 20 MG tablet Take 2 tablets (40 mg total) by mouth daily.   lovastatin (MEVACOR) 40 MG tablet Take 1 tablet (40 mg total) by mouth daily.   naproxen (NAPROSYN) 500 MG tablet Take 500 mg by mouth 2 (two) times daily.   triamcinolone cream (KENALOG) 0.1 % APPLY TO ITCHY AREAS ON ARMS AND SCALP DAILY   No facility-administered encounter medications on file as of 05/22/2022.     Lab Results  Component Value Date   WBC 7.4 05/22/2022   HGB 11.9 (L) 05/22/2022   HCT 36.0 (L) 05/22/2022   PLT 270.0 05/22/2022   GLUCOSE 106 (H) 05/22/2022   CHOL 122 03/19/2022   TRIG 112.0 03/19/2022   HDL 45.80 03/19/2022   LDLDIRECT 78.0 06/14/2021   LDLCALC 54 03/19/2022   ALT 13 05/22/2022   AST 18 05/22/2022   NA 138 05/22/2022   K 3.8 05/22/2022   CL 102 05/22/2022   CREATININE 1.11 05/22/2022   BUN 23 05/22/2022   CO2 25 05/22/2022   TSH 3.03 03/19/2022   PSA  1.76 06/14/2021   HGBA1C 5.9 03/19/2022    US Abdomen Limited RUQ (LIVER/GB)  Result Date: 03/01/2022 CLINICAL DATA:  Abnormal LFTs.  Elevated ferritin. EXAM: ULTRASOUND ABDOMEN LIMITED RIGHT UPPER QUADRANT COMPARISON:  None Available. FINDINGS: Gallbladder: Gallbladder is mildly contracted with wall thickness measuring 5 mm. Negative sonographic Murphy's sign. No cholelithiasis. Common bile duct: Diameter: 3 mm Liver: Increased echogenicity. No focal lesion. Portal vein is patent on color Doppler imaging with normal direction of blood flow towards the liver. Other: None. IMPRESSION: Increased hepatic parenchymal echogenicity suggestive of steatosis. Mildly contracted gallbladder without cholelithiasis. Electronically Signed   By: Lovey Newcomer M.D.   On: 03/01/2022 13:32       Assessment & Plan:   Problem List Items Addressed This Visit     Altered mental status    Found on the side of the road.  Unclear etiology.  W/up in ER as outlined.  Alcohol level was elevated.  States he was drinking some "coolers" at the cook out.  Was hot.  Kidney function decreased.  CT head and CTA head and neck unrevealing of any acute change.  Brought in papers from [facility]. Discussed given found in his car - unresponsive initially, that we would need to pursue w/up to try and determine etiology.  Also, explained - no driving for 6 months.  Unclear if had seizure, syncopal episode, etc.  Discussed would need cardiac and neuro w/up.  Again discussed no driving as outlined above.        Relevant Orders   Ambulatory referral to  Cardiology   Ambulatory referral to Neurology   Anemia    Slightly decreased hgb noted on recent labs.  Recheck cbc to confirm stable.       Relevant Orders   CBC w/Diff (Completed)   Atherosclerosis of native arteries of extremity with intermittent claudication ([place])    Has been followed by [facility].  Has been stable.       Benign essential hypertension    Blood pressure on outside checks  have been under reasonable control as outlined.  Continue lisinopril.  Recheck metabolic panel today.       CAD (coronary artery disease)    Had the episode as outlined.  Found unresponsive.  W/up in ER unrevealing.  Will have cardiology evaluate to help determine etiology.        Relevant Orders   Ambulatory referral to Cardiology   Elevated blood alcohol level    Elevated level in ER.  States does not usually drink alcohol.  Was drinking at cookout as outlined.  Check alcohol level today.  Discussed further w/up and evaluation.        Peripheral vascular disease ([place])    Has seen [facility].  Continue risk factor modification.        Other Visit Diagnoses     Abnormal liver enzymes       Relevant Orders   Hepatic function panel (Completed)   Hypokalemia       Relevant Orders   Basic Metabolic Panel (BMET) (Completed)   LOC (loss of consciousness) ([place])       Relevant Orders   Ethanol, Clinical (Completed)   Ambulatory referral to Cardiology   Ambulatory referral to Neurology        Einar Pheasant, MD

## 2022-05-23 ENCOUNTER — Other Ambulatory Visit: Payer: Self-pay | Admitting: *Deleted

## 2022-05-23 LAB — BASIC METABOLIC PANEL
BUN: 23 mg/dL (ref 6–23)
CO2: 25 mEq/L (ref 19–32)
Calcium: 9.8 mg/dL (ref 8.4–10.5)
Chloride: 102 mEq/L (ref 96–112)
Creatinine, Ser: 1.11 mg/dL (ref 0.40–1.50)
GFR: 66.96 mL/min (ref >60.00)
Glucose, Bld: 106 mg/dL — ABNORMAL HIGH (ref 70–99)
Potassium: 3.8 mEq/L (ref 3.5–5.1)
Sodium: 138 mEq/L (ref 135–145)

## 2022-05-23 LAB — CBC WITH DIFFERENTIAL/PLATELET
Basophils Absolute: 0.2 10*3/uL — ABNORMAL HIGH (ref 0.0–0.1)
Basophils Relative: 2.1 % (ref 0.0–3.0)
Eosinophils Absolute: 0.1 10*3/uL (ref 0.0–0.7)
Eosinophils Relative: 1.4 % (ref 0.0–5.0)
HCT: 36 % — ABNORMAL LOW (ref 39.0–52.0)
Hemoglobin: 11.9 g/dL — ABNORMAL LOW (ref 13.0–17.0)
Lymphocytes Relative: 21.9 % (ref 12.0–46.0)
Lymphs Abs: 1.6 10*3/uL (ref 0.7–4.0)
MCHC: 33 g/dL (ref 30.0–36.0)
MCV: 81.6 fl (ref 78.0–100.0)
Monocytes Absolute: 0.7 10*3/uL (ref 0.1–1.0)
Monocytes Relative: 10 % (ref 3.0–12.0)
Neutro Abs: 4.8 10*3/uL (ref 1.4–7.7)
Neutrophils Relative %: 64.6 % (ref 43.0–77.0)
Platelets: 270 10*3/uL (ref 150.0–400.0)
RBC: 4.41 Mil/uL (ref 4.22–5.81)
RDW: 13.9 % (ref 11.5–15.5)
WBC: 7.4 10*3/uL (ref 4.0–10.5)

## 2022-05-23 LAB — HEPATIC FUNCTION PANEL
ALT: 13 U/L (ref 0–53)
AST: 18 U/L (ref 0–37)
Albumin: 3.9 g/dL (ref 3.5–5.2)
Alkaline Phosphatase: 60 U/L (ref 39–117)
Bilirubin, Direct: 0.1 mg/dL (ref 0.0–0.3)
Total Bilirubin: 0.3 mg/dL (ref 0.2–1.2)
Total Protein: 7 g/dL (ref 6.0–8.3)

## 2022-05-25 ENCOUNTER — Other Ambulatory Visit: Payer: Medicare Other

## 2022-05-25 DIAGNOSIS — R4020 Unspecified coma: Secondary | ICD-10-CM

## 2022-05-28 LAB — ETHANOL, CLINICAL
Alcohol, Ethyl (B): NOT DETECTED g/dL(%)
Alcohol, Ethyl (B): NOT DETECTED mg/dL

## 2022-05-30 ENCOUNTER — Telehealth: Payer: Self-pay | Admitting: Internal Medicine

## 2022-05-30 NOTE — Telephone Encounter (Signed)
Patient called and said he gave Dr Nicki Reaper paper work to complete at his last visit. He was checking to see if it was ready.

## 2022-06-01 NOTE — Telephone Encounter (Signed)
See me before calling. Notify Charles Nichols that I did speak with neurology.  Regarding his driving, they agreed - no driving for 6 months.  We have to treat this the same way we treat known seizures.  Agreed with need for further w/up.  Working on getting him set up with cardiology and neurology - annie pen, etc.

## 2022-06-01 NOTE — Telephone Encounter (Signed)
I called and spoke with the patient and informed him that the provider stated that he could not drive for 6 months after speaking with Neurology, that his driving record had to be treated like a seizure and he understood.  Patinet stated that he could not get a continuance from [facility] it was denied so the form could be sent in to [facility] when completed. I also informed him that neurology and cardiology would be reaching out to him to schedule soon and he understood.  Amarianna Abplanalp,cma

## 2022-06-03 DIAGNOSIS — R780 Finding of alcohol in blood: Secondary | ICD-10-CM | POA: Insufficient documentation

## 2022-06-03 DIAGNOSIS — R4182 Altered mental status, unspecified: Secondary | ICD-10-CM | POA: Insufficient documentation

## 2022-06-03 NOTE — Assessment & Plan Note (Signed)
Slightly decreased hgb noted on recent labs.  Recheck cbc to confirm stable.

## 2022-06-03 NOTE — Assessment & Plan Note (Signed)
Found on the side of the road.  Unclear etiology.  W/up in ER as outlined.  Alcohol level was elevated.  States he was drinking some "coolers" at the cook out.  Was hot.  Kidney function decreased.  CT head and CTA head and neck unrevealing of any acute change.  Brought in papers from [facility]. Discussed given found in his car - unresponsive initially, that we would need to pursue w/up to try and determine etiology.  Also, explained - no driving for 6 months.  Unclear if had seizure, syncopal episode, etc.  Discussed would need cardiac and neuro w/up.  Again discussed no driving as outlined above.

## 2022-06-03 NOTE — Assessment & Plan Note (Signed)
Has been followed by [facility].  Has been stable.

## 2022-06-03 NOTE — Assessment & Plan Note (Signed)
Had the episode as outlined.  Found unresponsive.  W/up in ER unrevealing.  Will have cardiology evaluate to help determine etiology.

## 2022-06-03 NOTE — Assessment & Plan Note (Signed)
Has seen [facility].  Continue risk factor modification.   

## 2022-06-03 NOTE — Assessment & Plan Note (Signed)
Elevated level in ER.  States does not usually drink alcohol.  Was drinking at cookout as outlined.  Check alcohol level today.  Discussed further w/up and evaluation.

## 2022-06-03 NOTE — Assessment & Plan Note (Signed)
Blood pressure on outside checks have been under reasonable control as outlined.  Continue lisinopril.  Recheck metabolic panel today.

## 2022-06-04 ENCOUNTER — Ambulatory Visit: Payer: Medicare Other | Admitting: Internal Medicine

## 2022-06-04 NOTE — Telephone Encounter (Signed)
[name] Lovena Le called from [facility].  [name] states she is returning Dr. Randell Patient Scott's call regarding paperwork for patient.  [name] states we may call the main number:  [phone], option 1, option 7, and ask for her or the Nurse on Duty.  [name] states she will be there today until 4pm.

## 2022-06-04 NOTE — Telephone Encounter (Signed)
Did you call DMV for patient?

## 2022-06-04 NOTE — Telephone Encounter (Signed)
I spoke with Charles Nichols at [facility] & she is to have [name] call back my direct number ASAP. Also office number provided just in case.

## 2022-06-04 NOTE — Telephone Encounter (Signed)
Dr. Nicki Reaper was about to speak with Joaquim Lai from medical division at [facility]. Patient's paperwork is faxed & received fax confirmation as well.

## 2022-06-05 NOTE — Telephone Encounter (Signed)
I spoke to [place] and clarified form.  Information faxed.  Please notify Mr Charles Nichols.  Also notify him that Joaquim Lai (at [facility]) recommended for him to call in 4 days to confirm all information received that they needed.  He should be contacted with appts for neurology and cardiology.  Let us know if any problems.

## 2022-06-05 NOTE — Telephone Encounter (Signed)
Pt called and notified that paperwork was sent & that the [facility] should have received. He will call Thursday to make sure of this & let us know if anything else is needed by them on our end. He also knows that he should be contacted by cardiology & neurology. If not he will let us know that that too.

## 2022-06-07 DIAGNOSIS — M79672 Pain in left foot: Secondary | ICD-10-CM | POA: Diagnosis not present

## 2022-06-07 DIAGNOSIS — G5750 Tarsal tunnel syndrome, unspecified lower limb: Secondary | ICD-10-CM | POA: Diagnosis not present

## 2022-06-19 ENCOUNTER — Other Ambulatory Visit: Payer: Self-pay | Admitting: Internal Medicine

## 2022-06-19 DIAGNOSIS — R4182 Altered mental status, unspecified: Secondary | ICD-10-CM

## 2022-06-19 DIAGNOSIS — R4189 Other symptoms and signs involving cognitive functions and awareness: Secondary | ICD-10-CM

## 2022-06-19 NOTE — Progress Notes (Signed)
Order placed for neurology referral [facility] clinic) per pt request.

## 2022-07-09 DIAGNOSIS — M10041 Idiopathic gout, right hand: Secondary | ICD-10-CM | POA: Diagnosis not present

## 2022-07-09 DIAGNOSIS — M7989 Other specified soft tissue disorders: Secondary | ICD-10-CM | POA: Diagnosis not present

## 2022-07-09 DIAGNOSIS — G5750 Tarsal tunnel syndrome, unspecified lower limb: Secondary | ICD-10-CM | POA: Diagnosis not present

## 2022-07-09 DIAGNOSIS — M79642 Pain in left hand: Secondary | ICD-10-CM | POA: Diagnosis not present

## 2022-07-09 DIAGNOSIS — M1000 Idiopathic gout, unspecified site: Secondary | ICD-10-CM | POA: Diagnosis not present

## 2022-07-09 DIAGNOSIS — M79671 Pain in right foot: Secondary | ICD-10-CM | POA: Diagnosis not present

## 2022-07-09 DIAGNOSIS — Z79899 Other long term (current) drug therapy: Secondary | ICD-10-CM | POA: Diagnosis not present

## 2022-07-09 DIAGNOSIS — Z87891 Personal history of nicotine dependence: Secondary | ICD-10-CM | POA: Diagnosis not present

## 2022-07-09 DIAGNOSIS — E785 Hyperlipidemia, unspecified: Secondary | ICD-10-CM | POA: Diagnosis not present

## 2022-07-09 DIAGNOSIS — I10 Essential (primary) hypertension: Secondary | ICD-10-CM | POA: Diagnosis not present

## 2022-07-09 DIAGNOSIS — M79672 Pain in left foot: Secondary | ICD-10-CM | POA: Diagnosis not present

## 2022-07-17 DIAGNOSIS — H524 Presbyopia: Secondary | ICD-10-CM | POA: Diagnosis not present

## 2022-07-26 DIAGNOSIS — M21371 Foot drop, right foot: Secondary | ICD-10-CM | POA: Diagnosis not present

## 2022-07-26 DIAGNOSIS — R4189 Other symptoms and signs involving cognitive functions and awareness: Secondary | ICD-10-CM | POA: Diagnosis not present

## 2022-07-26 DIAGNOSIS — R4182 Altered mental status, unspecified: Secondary | ICD-10-CM | POA: Diagnosis not present

## 2022-07-30 DIAGNOSIS — M25512 Pain in left shoulder: Secondary | ICD-10-CM | POA: Diagnosis not present

## 2022-07-30 DIAGNOSIS — M25522 Pain in left elbow: Secondary | ICD-10-CM | POA: Diagnosis not present

## 2022-07-30 DIAGNOSIS — M2550 Pain in unspecified joint: Secondary | ICD-10-CM | POA: Diagnosis not present

## 2022-07-30 DIAGNOSIS — M659 Synovitis and tenosynovitis, unspecified: Secondary | ICD-10-CM | POA: Diagnosis not present

## 2022-07-30 DIAGNOSIS — M25511 Pain in right shoulder: Secondary | ICD-10-CM | POA: Diagnosis not present

## 2022-07-30 DIAGNOSIS — M25521 Pain in right elbow: Secondary | ICD-10-CM | POA: Diagnosis not present

## 2022-08-02 ENCOUNTER — Telehealth: Payer: Self-pay | Admitting: Cardiology

## 2022-08-02 ENCOUNTER — Encounter: Payer: Self-pay | Admitting: Internal Medicine

## 2022-08-02 ENCOUNTER — Ambulatory Visit (INDEPENDENT_AMBULATORY_CARE_PROVIDER_SITE_OTHER): Payer: Medicare Other

## 2022-08-02 ENCOUNTER — Other Ambulatory Visit: Payer: Self-pay | Admitting: Cardiology

## 2022-08-02 ENCOUNTER — Encounter: Payer: Self-pay | Admitting: Cardiology

## 2022-08-02 ENCOUNTER — Ambulatory Visit: Payer: Medicare Other | Attending: Cardiology | Admitting: Cardiology

## 2022-08-02 VITALS — BP 138/84 | HR 100 | Ht 74.0 in | Wt 279.0 lb

## 2022-08-02 DIAGNOSIS — R55 Syncope and collapse: Secondary | ICD-10-CM

## 2022-08-02 DIAGNOSIS — I10 Essential (primary) hypertension: Secondary | ICD-10-CM | POA: Diagnosis not present

## 2022-08-02 DIAGNOSIS — I739 Peripheral vascular disease, unspecified: Secondary | ICD-10-CM

## 2022-08-02 DIAGNOSIS — Z87898 Personal history of other specified conditions: Secondary | ICD-10-CM

## 2022-08-02 NOTE — Telephone Encounter (Signed)
PERCERT:  Echo dx: syncope   08-20-2022 ([facility] OFFICE)   14 day Zio dx: syncope

## 2022-08-02 NOTE — Progress Notes (Signed)
Cardiology Office Note  Date: 08/02/2022   ID: Charles Nichols, DOB December 18, 1950, MRN [id number]  PCP:  Einar Pheasant, MD  Cardiologist:  Rozann Lesches, MD Electrophysiologist:  None   Chief Complaint  Patient presents with   History of syncope    History of Present Illness: Charles Nichols is a 71 y.o. male referred for cardiology consultation by Dr. Nicki Reaper for evaluation of possible syncopal episode.  I reviewed the office note from August, patient apparently was found by police officers in his car up against a rail back in July.  He had been visiting with some family at a cookout watching sports, had a few wine coolers, also recalls that it was very hot outside.  He does not recall blacking out, no palpitations or chest pain.  He was evaluated in the ER with no acute findings by neuroimaging.  Was told not to drive for 6 months by Dr. Nicki Reaper and referred to both neurology and cardiology.  He states that he has had no subsequent events, does not recall any prior similar episodes.  At baseline reports no exertional chest pain, NYHA class II dyspnea, no palpitations, no orthopnea or PND.  I personally reviewed his ECG today which shows sinus rhythm with PVCs, nonspecific ST changes.  He has known PAD followed by Dr. Delana Meyer, I reviewed the note from January.  He has known occlusion of the right distal SFA and also occlusion of the left SFA including bypass graft on that side.  I reviewed his medications which are outlined below.  Past Medical History:  Diagnosis Date   Arthritis    Elevated ferritin    Negative work-up for hemochromatosis   History of chicken pox    Hypertension    Obesity    Peripheral vascular disease ([place])     Past Surgical History:  Procedure Laterality Date   CIRCUMCISION     COLONOSCOPY WITH PROPOFOL N/A 04/08/2015   Procedure: COLONOSCOPY WITH PROPOFOL;  Surgeon: Lollie Sails, MD;  Location: [facility] ENDOSCOPY;  Service: Endoscopy;  Laterality: N/A;    ESOPHAGOGASTRODUODENOSCOPY N/A 04/08/2015   Procedure: ESOPHAGOGASTRODUODENOSCOPY (EGD);  Surgeon: Lollie Sails, MD;  Location: [facility] ENDOSCOPY;  Service: Endoscopy;  Laterality: N/A;   FEMORAL-POPLITEAL BYPASS GRAFT Bilateral 1997, 2000   leg stent     pvd    Current Outpatient Medications  Medication Sig Dispense Refill   acetaminophen (TYLENOL) 325 MG tablet Take 650 mg by mouth every 6 (six) hours as needed for pain.     aspirin 81 MG tablet Take 81 mg by mouth daily.     chlorthalidone (HYGROTON) 25 MG tablet Take 1 tablet (25 mg total) by mouth daily. 90 tablet 3   Cholecalciferol (VITAMIN D3) 2000 UNITS TABS Take 1 tablet by mouth daily.     clobetasol cream (TEMOVATE) 9.73 % Apply 1 Application topically 2 (two) times daily.     felodipine (PLENDIL) 10 MG 24 hr tablet Take 1 tablet (10 mg total) by mouth daily. 90 tablet 3   lisinopril (ZESTRIL) 20 MG tablet Take 2 tablets (40 mg total) by mouth daily. 180 tablet 3   lovastatin (MEVACOR) 40 MG tablet Take 1 tablet (40 mg total) by mouth daily. 90 tablet 3   naproxen (NAPROSYN) 500 MG tablet Take 500 mg by mouth 2 (two) times daily.     triamcinolone cream (KENALOG) 0.1 % APPLY TO ITCHY AREAS ON ARMS AND SCALP DAILY     No current facility-administered medications for  this visit.   Allergies:  Patient has no known allergies.   Social History: The patient  reports that he has quit smoking. He has never used smokeless tobacco. He reports current alcohol use. He reports that he does not use drugs.   Family History: The patient's family history includes Breast cancer in his daughter and sister; Hypertension in his brother and sister.   ROS: Gout flare.  Physical Exam: VS:  BP 138/84   Pulse 100   Ht '6\' 2"'$  (1.88 m)   Wt 279 lb (126.6 kg)   SpO2 97%   BMI 35.82 kg/m , BMI Body mass index is 35.82 kg/m.  Wt Readings from Last 3 Encounters:  08/02/22 279 lb (126.6 kg)  05/22/22 279 lb 3.2 oz (126.6 kg)  04/04/22 284 lb  (128.8 kg)    General: Patient appears comfortable at rest. HEENT: Conjunctiva and lids normal, oropharynx clear. Neck: Supple, no elevated JVP or carotid bruits. Lungs: Clear to auscultation, nonlabored breathing at rest. Cardiac: Regular rate and rhythm, no S3 or significant systolic murmur. Abdomen: Soft, nontender, bowel sounds present. Extremities: Mild ankle edema, DPs 1+, no ulcerations. Skin: Warm and dry. Musculoskeletal: No kyphosis. Neuropsychiatric: Alert and oriented x3, affect grossly appropriate.  ECG:  An ECG dated 08/22/2017 was personally reviewed today and demonstrated:  Sinus rhythm.  Recent Labwork: 03/19/2022: TSH 3.03 05/22/2022: ALT 13; AST 18; BUN 23; Creatinine, Ser 1.11; Hemoglobin 11.9; Platelets 270.0; Potassium 3.8; Sodium 138     Component Value Date/Time   CHOL 122 03/19/2022 1222   TRIG 112.0 03/19/2022 1222   HDL 45.80 03/19/2022 1222   CHOLHDL 3 03/19/2022 1222   VLDL 22.4 03/19/2022 1222   LDLCALC 54 03/19/2022 1222   LDLDIRECT 78.0 06/14/2021 1224    Other Studies Reviewed Today:  No prior cardiac testing for review.  Assessment and Plan:  1.  Episode of possible syncope back in July as discussed above.  One wonders if it could have been vasovagal but he was seated and driving at the time however.  Possibly related to alcohol intake.  He reports no prodrome or preceding symptoms.  Has had no prior or subsequent episodes of syncope.  No known history of cardiomyopathy or cardiac arrhythmia.  ECG today does show PVCs.  He is asymptomatic in this respect.  Plan at this point is to obtain a 14-day Zio patch and an echocardiogram.  No changes in his medications.  He concurrently has had a evaluation by neurology with reportedly no acute findings or concerns for seizure.  2.  Essential hypertension, currently on lisinopril, chlorthalidone, and Plendil.  Systolic in the 497W today.  Keep follow-up with Dr. Nicki Reaper.  3.  PAD as described above, reports  no progressive claudication.  He is on aspirin and Mevacor with follow-up by vascular specialist.  Medication Adjustments/Labs and Tests Ordered: Current medicines are reviewed at length with the patient today.  Concerns regarding medicines are outlined above.   Tests Ordered: Orders Placed This Encounter  Procedures   EKG 12-Lead   ECHOCARDIOGRAM COMPLETE    Medication Changes: No orders of the defined types were placed in this encounter.   Disposition:  Follow up  test results.  Signed, Satira Sark, MD, [facility] 08/02/2022 2:00 PM    [facility], [place], [place] [address] Phone: [phone]; Fax: [phone]

## 2022-08-02 NOTE — Patient Instructions (Signed)
Medication Instructions:  Your physician recommends that you continue on your current medications as directed. Please refer to the Current Medication list given to you today.   Labwork: none  Testing/Procedures: Your physician has requested that you have an echocardiogram. Echocardiography is a painless test that uses sound waves to create images of your heart. It provides your doctor with information about the size and shape of your heart and how well your heart's chambers and valves are working. This procedure takes approximately one hour. There are no restrictions for this procedure. Please do NOT wear cologne, perfume, aftershave, or lotions (deodorant is allowed). Please arrive 15 minutes prior to your appointment time.   ZIO- Long Term Monitor Instructions   Your physician has requested you wear your ZIO patch monitor 14 days.   This is a single patch monitor.  [employer] supplies one patch monitor per enrollment.  Additional stickers are not available.   Please do not apply patch if you will be having a Nuclear Stress Test, Echocardiogram, Cardiac CT, MRI, or Chest Xray during the time frame you would be wearing the monitor. The patch cannot be worn during these tests.  You cannot remove and re-apply the ZIO XT patch monitor.   Do not shower for the first 24 hours.  You may shower after the first 24 hours.   Press button if you feel a symptom. You will hear a small click.  Record Date, Time and Symptom in the Patient Log Book.   When you are ready to remove patch, follow instructions on last 2 pages of Patient Log Book.  Stick patch monitor onto last page of Patient Log Book.   Place Patient Log Book in [place] box.  Use locking tab on box and tape box closed securely.  The [employer] has [employer] on it.  Please place in mailbox as soon as possible.  Your physician should have your test results approximately 7 days after the monitor has been mailed back to [facility].   Call  [place] at [phone] if you have questions regarding your ZIO XT patch monitor.  Call them immediately if you see an orange light blinking on your monitor.   If your monitor falls off in less than 4 days contact our Monitor department at [phone].  If your monitor becomes loose or falls off after 4 days call [employer] at [phone] for suggestions on securing your monitor.   Follow-Up:  Your physician recommends that you schedule a follow-up appointment in: Follow Up Pending  Any Other Special Instructions Will Be Listed Below (If Applicable).  If you need a refill on your cardiac medications before your next appointment, please call your pharmacy.

## 2022-08-06 ENCOUNTER — Other Ambulatory Visit: Payer: Self-pay | Admitting: Cardiology

## 2022-08-06 DIAGNOSIS — R55 Syncope and collapse: Secondary | ICD-10-CM

## 2022-08-13 DIAGNOSIS — D72829 Elevated white blood cell count, unspecified: Secondary | ICD-10-CM | POA: Diagnosis not present

## 2022-08-13 DIAGNOSIS — M7542 Impingement syndrome of left shoulder: Secondary | ICD-10-CM | POA: Diagnosis not present

## 2022-08-16 ENCOUNTER — Inpatient Hospital Stay: Payer: Medicare Other | Attending: Physician Assistant

## 2022-08-16 DIAGNOSIS — R7989 Other specified abnormal findings of blood chemistry: Secondary | ICD-10-CM | POA: Diagnosis not present

## 2022-08-16 DIAGNOSIS — Z79899 Other long term (current) drug therapy: Secondary | ICD-10-CM | POA: Insufficient documentation

## 2022-08-16 DIAGNOSIS — R748 Abnormal levels of other serum enzymes: Secondary | ICD-10-CM | POA: Diagnosis not present

## 2022-08-16 DIAGNOSIS — D649 Anemia, unspecified: Secondary | ICD-10-CM | POA: Insufficient documentation

## 2022-08-16 LAB — CBC WITH DIFFERENTIAL/PLATELET
Abs Immature Granulocytes: 0.12 10*3/uL — ABNORMAL HIGH (ref 0.00–0.07)
Basophils Absolute: 0 10*3/uL (ref 0.0–0.1)
Basophils Relative: 0 %
Eosinophils Absolute: 0 10*3/uL (ref 0.0–0.5)
Eosinophils Relative: 0 %
HCT: 35.4 % — ABNORMAL LOW (ref 39.0–52.0)
Hemoglobin: 11.6 g/dL — ABNORMAL LOW (ref 13.0–17.0)
Immature Granulocytes: 1 %
Lymphocytes Relative: 19 %
Lymphs Abs: 2.3 10*3/uL (ref 0.7–4.0)
MCH: 26.4 pg (ref 26.0–34.0)
MCHC: 32.8 g/dL (ref 30.0–36.0)
MCV: 80.5 fL (ref 80.0–100.0)
Monocytes Absolute: 1.3 10*3/uL — ABNORMAL HIGH (ref 0.1–1.0)
Monocytes Relative: 10 %
Neutro Abs: 8.4 10*3/uL — ABNORMAL HIGH (ref 1.7–7.7)
Neutrophils Relative %: 70 %
Platelets: 324 10*3/uL (ref 150–400)
RBC: 4.4 MIL/uL (ref 4.22–5.81)
RDW: 14.6 % (ref 11.5–15.5)
WBC: 12.1 10*3/uL — ABNORMAL HIGH (ref 4.0–10.5)
nRBC: 0 % (ref 0.0–0.2)

## 2022-08-16 LAB — COMPREHENSIVE METABOLIC PANEL
ALT: 14 U/L (ref 0–44)
AST: 18 U/L (ref 15–41)
Albumin: 3.6 g/dL (ref 3.5–5.0)
Alkaline Phosphatase: 49 U/L (ref 38–126)
Anion gap: 5 (ref 5–15)
BUN: 17 mg/dL (ref 8–23)
CO2: 25 mmol/L (ref 22–32)
Calcium: 9.2 mg/dL (ref 8.9–10.3)
Chloride: 109 mmol/L (ref 98–111)
Creatinine, Ser: 0.99 mg/dL (ref 0.61–1.24)
GFR, Estimated: >60 mL/min (ref >60)
Glucose, Bld: 103 mg/dL — ABNORMAL HIGH (ref 70–99)
Potassium: 3.8 mmol/L (ref 3.5–5.1)
Sodium: 139 mmol/L (ref 135–145)
Total Bilirubin: 0.5 mg/dL (ref 0.3–1.2)
Total Protein: 7.4 g/dL (ref 6.5–8.1)

## 2022-08-16 LAB — IRON AND TIBC
Iron: 90 ug/dL (ref 45–182)
Saturation Ratios: 29 % (ref 17.9–39.5)
TIBC: 316 ug/dL (ref 250–450)
UIBC: 226 ug/dL

## 2022-08-16 LAB — FERRITIN: Ferritin: 454 ng/mL — ABNORMAL HIGH (ref 24–336)

## 2022-08-16 LAB — VITAMIN B12: Vitamin B-12: 6770 pg/mL — ABNORMAL HIGH (ref 180–914)

## 2022-08-16 LAB — LACTATE DEHYDROGENASE: LDH: 109 U/L (ref 98–192)

## 2022-08-20 ENCOUNTER — Ambulatory Visit: Payer: Medicare Other | Attending: Cardiology

## 2022-08-20 DIAGNOSIS — R55 Syncope and collapse: Secondary | ICD-10-CM | POA: Diagnosis not present

## 2022-08-20 LAB — ECHOCARDIOGRAM COMPLETE
AR max vel: 2.66 cm2
AV Peak grad: 12.5 mmHg
Ao pk vel: 1.77 m/s
Area-P 1/2: 5.02 cm2
Calc EF: 71.1 %
MV M vel: 2.59 m/s
MV Peak grad: 26.8 mmHg
S' Lateral: 2.5 cm
Single Plane A2C EF: 76.1 %
Single Plane A4C EF: 63.9 %

## 2022-08-20 LAB — METHYLMALONIC ACID, SERUM: Methylmalonic Acid, Quantitative: 174 nmol/L (ref 0–378)

## 2022-08-22 NOTE — Progress Notes (Unsigned)
Charles Nichols, [place] [address]   CLINIC:  Medical Oncology/Hematology  PCP:  Charles Nichols, [address] [place] [id number] [phone]   REASON FOR VISIT:  Follow-up for elevated ferritin and normocytic anemia  PRIOR THERAPY: None  CURRENT THERAPY: Surveillance  INTERVAL HISTORY:  Charles Nichols 71 y.o. male returns for routine follow-up of his elevated ferritin.  He was last seen by Charles Abernethy, PA-C on 02/20/2022.  At today's visit, he reports feeling well.  He has not had any changes in his health status since his last office visit.  He continues to report chronic joint pain secondary to arthritis and gout.  He denies any abdominal pain or abnormal fatigue.   He does not take any iron supplement at home.  Patient reports that he stopped drinking alcohol and eating liver after his visit 6 months ago.  He also stopped taking vitamin B12 supplements after his visit 6 months ago.  He has 85% energy and 100% appetite. He endorses that he is maintaining a stable weight.   REVIEW OF SYSTEMS:  Review of Systems  Constitutional:  Negative for appetite change, chills, diaphoresis, fatigue, fever and unexpected weight change.  HENT:   Negative for lump/mass and nosebleeds.   Eyes:  Negative for eye problems.  Respiratory:  Negative for cough, hemoptysis and shortness of breath.   Cardiovascular:  Negative for chest pain, leg swelling and palpitations.  Gastrointestinal:  Negative for abdominal pain, blood in stool, constipation, diarrhea, nausea and vomiting.  Genitourinary:  Negative for hematuria.   Musculoskeletal:  Positive for arthralgias and back pain.  Skin: Negative.   Neurological:  Negative for dizziness, headaches and light-headedness.  Hematological:  Does not bruise/bleed easily.  Psychiatric/Behavioral:  Positive for depression. The patient is nervous/anxious.       PAST MEDICAL/SURGICAL  HISTORY:  Past Medical History:  Diagnosis Date   Arthritis    Elevated ferritin    Negative work-up for hemochromatosis   History of chicken pox    Hypertension    Obesity    Peripheral vascular disease ([place])    Past Surgical History:  Procedure Laterality Date   CIRCUMCISION     COLONOSCOPY WITH PROPOFOL N/A 04/08/2015   Procedure: COLONOSCOPY WITH PROPOFOL;  Surgeon: Lollie Sails, MD;  Location: [facility] ENDOSCOPY;  Service: Endoscopy;  Laterality: N/A;   ESOPHAGOGASTRODUODENOSCOPY N/A 04/08/2015   Procedure: ESOPHAGOGASTRODUODENOSCOPY (EGD);  Surgeon: Lollie Sails, MD;  Location: [facility] ENDOSCOPY;  Service: Endoscopy;  Laterality: N/A;   FEMORAL-POPLITEAL BYPASS GRAFT Bilateral 1997, 2000   leg stent     pvd     SOCIAL HISTORY:  Social History   Socioeconomic History   Marital status: Legally Separated    Spouse name: Not on file   Number of children: Not on file   Years of education: Not on file   Highest education level: Not on file  Occupational History   Not on file  Tobacco Use   Smoking status: Former   Smokeless tobacco: Never  Vaping Use   Vaping Use: Never used  Substance and Sexual Activity   Alcohol use: Yes    Alcohol/week: 0.0 standard drinks of alcohol   Drug use: No   Sexual activity: Not on file  Other Topics Concern   Not on file  Social History Narrative   Not on file   Social Determinants of Health   Financial Resource Strain: Low Risk  (04/04/2022)   Overall  Financial Resource Strain (CARDIA)    Difficulty of Paying Living Expenses: Not hard at all  Food Insecurity: No Food Insecurity (04/04/2022)   Hunger Vital Sign    Worried About Running Out of Food in the Last Year: Never true    Ran Out of Food in the Last Year: Never true  Transportation Needs: No Transportation Needs (04/04/2022)   PRAPARE - Hydrologist (Medical): No    Lack of Transportation (Non-Medical): No  Physical Activity: Insufficiently  Active (04/04/2022)   Exercise Vital Sign    Days of Exercise per Week: 3 days    Minutes of Exercise per Session: 20 min  Stress: No Stress Concern Present (04/04/2022)   [place]    Feeling of Stress : Not at all  Social Connections: Unknown (04/04/2022)   Social Connection and Isolation Panel [NHANES]    Frequency of Communication with Friends and Family: More than three times a week    Frequency of Social Gatherings with Friends and Family: More than three times a week    Attends Religious Services: Not on file    Active Member of Clubs or Organizations: Not on file    Attends Archivist Meetings: Not on file    Marital Status: Not on file  Intimate Partner Violence: Not At Risk (04/04/2022)   Humiliation, Afraid, Rape, and Kick questionnaire    Fear of Current or Ex-Partner: No    Emotionally Abused: No    Physically Abused: No    Sexually Abused: No    FAMILY HISTORY:  Family History  Problem Relation Age of Onset   Breast cancer Sister    Hypertension Sister    Hypertension Brother    Breast cancer Daughter     CURRENT MEDICATIONS:  Outpatient Encounter Medications as of 08/23/2022  Medication Sig   acetaminophen (TYLENOL) 325 MG tablet Take 650 mg by mouth every 6 (six) hours as needed for pain.   aspirin 81 MG tablet Take 81 mg by mouth daily.   chlorthalidone (HYGROTON) 25 MG tablet Take 1 tablet (25 mg total) by mouth daily.   Cholecalciferol (VITAMIN D3) 2000 UNITS TABS Take 1 tablet by mouth daily.   clobetasol cream (TEMOVATE) 7.84 % Apply 1 Application topically 2 (two) times daily.   felodipine (PLENDIL) 10 MG 24 hr tablet Take 1 tablet (10 mg total) by mouth daily.   lisinopril (ZESTRIL) 20 MG tablet Take 2 tablets (40 mg total) by mouth daily.   lovastatin (MEVACOR) 40 MG tablet Take 1 tablet (40 mg total) by mouth daily.   naproxen (NAPROSYN) 500 MG tablet Take 500 mg by mouth 2  (two) times daily.   triamcinolone cream (KENALOG) 0.1 % APPLY TO ITCHY AREAS ON ARMS AND SCALP DAILY   No facility-administered encounter medications on file as of 08/23/2022.    ALLERGIES:  No Known Allergies   PHYSICAL EXAM:  ECOG PERFORMANCE STATUS: 0 - Asymptomatic  There were no vitals filed for this visit. There were no vitals filed for this visit. Physical Exam Constitutional:      Appearance: Normal appearance. He is obese.  HENT:     Head: Normocephalic and atraumatic.     Mouth/Throat:     Mouth: Mucous membranes are moist.  Eyes:     Extraocular Movements: Extraocular movements intact.     Pupils: Pupils are equal, round, and reactive to light.  Cardiovascular:     Rate  and Rhythm: Normal rate and regular rhythm.     Pulses: Normal pulses.     Heart sounds: Normal heart sounds.  Pulmonary:     Effort: Pulmonary effort is normal.     Breath sounds: Normal breath sounds.  Abdominal:     General: Bowel sounds are normal.     Palpations: Abdomen is soft.     Tenderness: There is no abdominal tenderness.  Musculoskeletal:        General: No swelling.     Right lower leg: No edema.     Left lower leg: No edema.  Lymphadenopathy:     Cervical: No cervical adenopathy.  Skin:    General: Skin is warm and dry.  Neurological:     General: No focal deficit present.     Mental Status: He is alert and oriented to person, place, and time.  Psychiatric:        Mood and Affect: Mood normal.        Behavior: Behavior normal.      LABORATORY DATA:  I have reviewed the labs as listed.  CBC    Component Value Date/Time   WBC 12.1 (H) 08/16/2022 0900   RBC 4.40 08/16/2022 0900   HGB 11.6 (L) 08/16/2022 0900   HCT 35.4 (L) 08/16/2022 0900   PLT 324 08/16/2022 0900   MCV 80.5 08/16/2022 0900   MCH 26.4 08/16/2022 0900   MCHC 32.8 08/16/2022 0900   RDW 14.6 08/16/2022 0900   LYMPHSABS 2.3 08/16/2022 0900   MONOABS 1.3 (H) 08/16/2022 0900   EOSABS 0.0  08/16/2022 0900   BASOSABS 0.0 08/16/2022 0900      Latest Ref Rng & Units 08/16/2022    9:00 AM 05/22/2022    4:25 PM 03/19/2022   12:22 PM  CMP  Glucose 70 - 99 mg/dL 103  106  91   BUN 8 - 23 mg/dL _0 Creatinine 0.61 - 1.24 mg/dL 0.99  1.11  1.11   Sodium 135 - 145 mmol/L 139  138  139   Potassium 3.5 - 5.1 mmol/L 3.8  3.8  4.1   Chloride 98 - 111 mmol/L 109  102  102   CO2 22 - 32 mmol/L _1 Calcium 8.9 - 10.3 mg/dL 9.2  9.8  9.7   Total Protein 6.5 - 8.1 g/dL 7.4  7.0    Total Bilirubin 0.3 - 1.2 mg/dL 0.5  0.3    Alkaline Phos 38 - 126 U/L 49  60    AST 15 - 41 U/L 18  18    ALT 0 - 44 U/L 14  13      DIAGNOSTIC IMAGING:  I have independently reviewed the relevant imaging and discussed with the patient.  ASSESSMENT & PLAN: 1.  Elevated ferritin: - Patient evaluated for elevated ferritin since at least 2017 (range 540-840).  Percent saturation was within normal limits. - Patient reports that he was tested for elevated ferritin at [facility] 10 years ago. - He has arthritis of multiple joints.  No known liver problems.  No neuropathy other than right foot drop.  No prior transfusion history. - He was donating blood until 4 years ago.  - He does not take any iron supplements.  He previously drank occasional alcohol and frequently liver pudding, but he has cut these out of his diet since May 2023. - Hematology work-up (01/26/2022):  Hemochromatosis mutational analysis negative Ferritin 495, iron  saturation 24%.  Elevated vitamin B12 at 3427 may also be acute phase reactant. CBC with Hgb 11.6/MCV 81.8, otherwise unremarkable Elevated CRP 1.5, elevated ESR 75.  Normal rheumatoid factor and ANA.  Normal LDH. - Abdominal ultrasound (03/01/2022): Increased hepatic parenchymal echogenicity suggestive of steatosis - With ferritin <1000, he is unlikely to have any organ iron deposition. - Most recent labs (08/16/2022): Ferritin 454, iron saturation 29%.  Normal LFTs on CMP.   Normal LDH. - DIFFERENTIAL DIAGNOSIS: We discussed various causes of elevated ferritin.  No clear etiology at this time, but suspected to be secondary to fatty liver disease and acute phase reactant secondary to chronic inflammation (elevated ESR and CRP).  There is some low possibility that he may have hemochromatosis from undiscovered mutation. - PLAN: Continue to avoid iron supplementation and cut back on iron rich foods such as liver pudding. - We will continue to monitor with repeat labs and RTC in 1 year  2.  Elevated vitamin B12 - Labs from April 2023 showed elevated B12 at 3427. - Patient stopped taking vitamin B12 supplements in May 2023 - Most recent labs (08/16/2022) show progressively elevated vitamin B12 6770, normal MMA  - PLAN: Most recent vitamin B12 likely elevated as an ACUTE PHASE REACTANT in the setting of acute gouty attack. - Patient counseled to continue to avoid vitamin B12 supplementation.  We will recheck B12/MMA levels at follow-up visit in 1 year  3.  Normocytic anemia: - He has mild normocytic anemia since 2015. - Reports that he was donating blood regularly up until 4 years ago.   - Colonoscopy in 2017 was unremarkable (diverticulosis in sigmoid colon). - He denies any bleeding per rectum or melena.  - Hematology work-up (01/26/2022):  Ferritin 495, iron saturation 24% CBC with Hgb 11.6/MCV 81.8, otherwise unremarkable Elevated CRP 1.5, elevated ESR 75.  Normal rheumatoid factor and ANA.  Normal LDH. Normal SPEP.  Normal reticulocytes. No apparent deficiency in copper, folate, B12, methylmalonic acid. Creatinine 1.16/GFR >60 - Most recent labs (08/16/2022): Hgb 11.6/MCV 80.5.  Mild isolated leukocytosis with WBC 12.1/ANC 8.4/monocytes 1.3.  (Isolated episode of leukocytosis likely secondary to steroid injection received on 08/13/2022)  - No clear etiology of anemia at this time.  May be anemia of chronic disease. - PLAN: We will continue to monitor with repeat labs  and RTC in 1 year - If any significant deviations from baseline would consider bone marrow biopsy.  4.  Social/family history: - He retired from [employer].  Exposure to dyes and acids.  Quit smoking 1997 after arterial bypass surgery on right leg.  Occasional alcohol intake. - No family history of hemochromatosis. - 2 sisters had breast cancer.  Daughter died of breast cancer.   PLAN SUMMARY: >> Labs in 1 year (CBC/D, CMP, LDH, B12, MMA, ferritin, iron/TIBC) >> Office visit in 1 year, 1 week after labs   All questions were answered. The patient knows to call the clinic with any problems, questions or concerns.  Medical decision making: Moderate  Time spent on visit: I spent 20 minutes counseling the patient face to face. The total time spent in the appointment was 30 minutes and more than 50% was on counseling.   Harriett Rush, PA-C  08/23/2022 10:53 AM

## 2022-08-23 ENCOUNTER — Other Ambulatory Visit: Payer: Self-pay

## 2022-08-23 ENCOUNTER — Inpatient Hospital Stay: Payer: Medicare Other | Admitting: Physician Assistant

## 2022-08-23 VITALS — BP 142/66 | HR 89 | Temp 98.2°F | Resp 17 | Ht 74.0 in | Wt 279.5 lb

## 2022-08-23 DIAGNOSIS — D649 Anemia, unspecified: Secondary | ICD-10-CM

## 2022-08-23 DIAGNOSIS — R748 Abnormal levels of other serum enzymes: Secondary | ICD-10-CM

## 2022-08-23 DIAGNOSIS — R7989 Other specified abnormal findings of blood chemistry: Secondary | ICD-10-CM | POA: Diagnosis not present

## 2022-08-23 DIAGNOSIS — Z79899 Other long term (current) drug therapy: Secondary | ICD-10-CM | POA: Diagnosis not present

## 2022-08-23 NOTE — Patient Instructions (Signed)
[place] at [facility] Discharge Instructions  You were seen today by Tarri Abernethy PA-C for your elevated ferritin (high iron levels) and your mild anemia (low red blood cells).   Your high iron levels are likely related to inflammation in your body as well as fatty liver disease.  Continue to avoid any iron supplements, iron rich foods (such as liver), and alcohol consumption.  Please see the attached handout for additional information regarding fatty liver disease.  You continue to have mild anemia, which is likely related to chronic disease and inflammation.  You do not need any treatment for anemia at this time, but we will continue to monitor your labs at follow-up visit.  Your vitamin B-12 level remains elevated.  Continue to avoid any vitamin B12 supplements at this time.  FOLLOW-UP APPOINTMENT: Labs and office visit in 1 year   ** Thank you for trusting me with your healthcare!  I strive to provide all of my patients with quality care at each visit.  If you receive a survey for this visit, I would be so grateful to you for taking the time to provide feedback.  Thank you in advance!  ~ Jemaine Prokop                   Dr. Derek Jack   &   Tarri Abernethy, PA-C   - - - - - - - - - - - - - - - - - -    Thank you for choosing [place] at [facility] to provide your oncology and hematology care.  To afford each patient quality time with our provider, please arrive at least 15 minutes before your scheduled appointment time.   If you have a lab appointment with the [facility] please come in thru the Main Entrance and check in at the main information desk.  You need to re-schedule your appointment should you arrive 10 or more minutes late.  We strive to give you quality time with our providers, and arriving late affects you and other patients whose appointments are after yours.  Also, if you no show three or more times for  appointments you may be dismissed from the clinic at the providers discretion.     Again, thank you for choosing [facility].  Our hope is that these requests will decrease the amount of time that you wait before being seen by our physicians.       _____________________________________________________________  Should you have questions after your visit to [facility], please contact our office at [phone] and follow the prompts.  Our office hours are 8:00 a.m. and 4:30 p.m. Monday - Friday.  Please note that voicemails left after 4:00 p.m. may not be returned until the following business day.  We are closed weekends and major holidays.  You do have access to a nurse 24-7, just call the main number to the clinic [phone] and do not press any options, hold on the line and a nurse will answer the phone.    For prescription refill requests, have your pharmacy contact our office and allow 72 hours.    Due to Covid, you will need to wear a mask upon entering the hospital. If you do not have a mask, a mask will be given to you at the Main Entrance upon arrival. For doctor visits, patients may have 1 support person age 28 or older with them. For treatment  visits, patients can not have anyone with them due to social distancing guidelines and our immunocompromised population.

## 2022-09-05 DIAGNOSIS — M2550 Pain in unspecified joint: Secondary | ICD-10-CM | POA: Diagnosis not present

## 2022-09-05 DIAGNOSIS — M7542 Impingement syndrome of left shoulder: Secondary | ICD-10-CM | POA: Diagnosis not present

## 2022-09-05 DIAGNOSIS — M1009 Idiopathic gout, multiple sites: Secondary | ICD-10-CM | POA: Diagnosis not present

## 2022-09-05 DIAGNOSIS — M25421 Effusion, right elbow: Secondary | ICD-10-CM | POA: Diagnosis not present

## 2022-09-12 DIAGNOSIS — M75101 Unspecified rotator cuff tear or rupture of right shoulder, not specified as traumatic: Secondary | ICD-10-CM | POA: Diagnosis not present

## 2022-09-12 DIAGNOSIS — M2550 Pain in unspecified joint: Secondary | ICD-10-CM | POA: Diagnosis not present

## 2022-09-12 DIAGNOSIS — M1009 Idiopathic gout, multiple sites: Secondary | ICD-10-CM | POA: Diagnosis not present

## 2022-09-12 DIAGNOSIS — M25421 Effusion, right elbow: Secondary | ICD-10-CM | POA: Diagnosis not present

## 2022-09-18 ENCOUNTER — Encounter: Payer: Self-pay | Admitting: Internal Medicine

## 2022-09-18 ENCOUNTER — Ambulatory Visit (INDEPENDENT_AMBULATORY_CARE_PROVIDER_SITE_OTHER): Payer: Medicare Other | Admitting: Internal Medicine

## 2022-09-18 VITALS — BP 118/76 | HR 87 | Temp 98.6°F | Resp 16 | Ht 74.0 in | Wt 282.4 lb

## 2022-09-18 DIAGNOSIS — M25521 Pain in right elbow: Secondary | ICD-10-CM

## 2022-09-18 DIAGNOSIS — E7800 Pure hypercholesterolemia, unspecified: Secondary | ICD-10-CM | POA: Diagnosis not present

## 2022-09-18 DIAGNOSIS — I739 Peripheral vascular disease, unspecified: Secondary | ICD-10-CM

## 2022-09-18 DIAGNOSIS — R4182 Altered mental status, unspecified: Secondary | ICD-10-CM

## 2022-09-18 DIAGNOSIS — D649 Anemia, unspecified: Secondary | ICD-10-CM

## 2022-09-18 DIAGNOSIS — D72829 Elevated white blood cell count, unspecified: Secondary | ICD-10-CM | POA: Diagnosis not present

## 2022-09-18 DIAGNOSIS — Z860100 Personal history of colon polyps, unspecified: Secondary | ICD-10-CM

## 2022-09-18 DIAGNOSIS — Z23 Encounter for immunization: Secondary | ICD-10-CM

## 2022-09-18 DIAGNOSIS — R7989 Other specified abnormal findings of blood chemistry: Secondary | ICD-10-CM

## 2022-09-18 DIAGNOSIS — R739 Hyperglycemia, unspecified: Secondary | ICD-10-CM | POA: Diagnosis not present

## 2022-09-18 DIAGNOSIS — I70213 Atherosclerosis of native arteries of extremities with intermittent claudication, bilateral legs: Secondary | ICD-10-CM

## 2022-09-18 DIAGNOSIS — I10 Essential (primary) hypertension: Secondary | ICD-10-CM | POA: Diagnosis not present

## 2022-09-18 DIAGNOSIS — I25118 Atherosclerotic heart disease of native coronary artery with other forms of angina pectoris: Secondary | ICD-10-CM

## 2022-09-18 MED ORDER — FELODIPINE ER 10 MG PO TB24: 10.0000 mg | ORAL_TABLET | Freq: Every day | ORAL | 3 refills | Status: DC

## 2022-09-18 MED ORDER — LISINOPRIL 20 MG PO TABS: 40.0000 mg | ORAL_TABLET | Freq: Every day | ORAL | 3 refills | Status: DC

## 2022-09-18 MED ORDER — CHLORTHALIDONE 25 MG PO TABS: 25.0000 mg | ORAL_TABLET | Freq: Every day | ORAL | 3 refills | Status: DC

## 2022-09-18 MED ORDER — LOVASTATIN 40 MG PO TABS: 40.0000 mg | ORAL_TABLET | Freq: Every day | ORAL | 3 refills | Status: DC

## 2022-09-18 NOTE — Progress Notes (Signed)
Patient ID: Charles Nichols, male   DOB: [birth year], 71 y.o.   MRN: [id number]   Subjective:    Patient ID: Charles Nichols, male    DOB: 1950/10/09, 71 y.o.   MRN: [id number]   Patient here for follow up appt  Chief Complaint  Patient presents with   Hypertension   .   Hypertension Pertinent negatives include no chest pain, headaches, palpitations or shortness of breath.   Work in last visit for ER follow up.  Was seen 04/29/22 - ER after being found by police officers with his car against a rail.  Per note review, in ER he was alert and oriented x 3.  Report was mental status at baseline.  W/up included a normal head CT. CTA (head and neck) - no emergent large vessel occlusion or hemodynamically significant stenosis of the head or neck.  Labs revealed decreased GFR, slightly decreased potassium and elevated ethanol level.  Reports he normally does not drink.  Was at his nephews house - outside.  Drank some "coolers" with alcohol.  States was out in the heat.  Had chicken wings and salad.  Remembers getting in his car and driving.  Does not remember ending up on the side of the road.  Does remember being in the ER.  No history of seizures or syncope.  No chest pain.  Tries to stay active.  No increased heart rate or palpitations.  No nausea or vomiting.  Bowels moving.   Saw cardiology  08/02/22 - has no prior or subsequent episodes of syncope. No known history of cardiomyopathy or cardiac arrhythmia. ECG today revealed PVCs. Recommended 14-day Zio patch and an echocardiogram.  ECHO 08/20/22 - EF 70-75% with no substantial valvular abnormalities.    Cardiac monitor showed no sustained arrhythmias or pauses.  There were occasional PVCs and also a few very brief episodes of SVT, but none were associated with patient triggered events and these would not be expected to cause syncope in and of themselves.  Also saw neurology.  Per note review, suspect spell of altered mental status provoked - not c/w seizure.  No  further w/up recommended.  States he feels fine.  Denies any other alcohol intake.  Also denies any further episodes - no dizziness or light headedness.  No syncope or near syncope.  No chest pain or sob.  Has had issues with his elbow and multiple joint pain and swelling. Saw ortho.  Diagnosed with gout.  Placed on naprosyn.  Discussed starting allopurinol once acute flare resolved.     Past Medical History:  Diagnosis Date   Arthritis    Elevated ferritin    Negative work-up for hemochromatosis   History of chicken pox    Hypertension    Obesity    Peripheral vascular disease ([place])    Past Surgical History:  Procedure Laterality Date   CIRCUMCISION     COLONOSCOPY WITH PROPOFOL N/A 04/08/2015   Procedure: COLONOSCOPY WITH PROPOFOL;  Surgeon: Lollie Sails, MD;  Location: [facility] ENDOSCOPY;  Service: Endoscopy;  Laterality: N/A;   ESOPHAGOGASTRODUODENOSCOPY N/A 04/08/2015   Procedure: ESOPHAGOGASTRODUODENOSCOPY (EGD);  Surgeon: Lollie Sails, MD;  Location: [facility] ENDOSCOPY;  Service: Endoscopy;  Laterality: N/A;   FEMORAL-POPLITEAL BYPASS GRAFT Bilateral 1997, 2000   leg stent     pvd   Family History  Problem Relation Age of Onset   Breast cancer Sister    Hypertension Sister    Hypertension Brother    Breast cancer Daughter  Social History   Socioeconomic History   Marital status: Legally Separated    Spouse name: Not on file   Number of children: Not on file   Years of education: Not on file   Highest education level: Not on file  Occupational History   Not on file  Tobacco Use   Smoking status: Former   Smokeless tobacco: Never  Vaping Use   Vaping Use: Never used  Substance and Sexual Activity   Alcohol use: Yes    Alcohol/week: 0.0 standard drinks of alcohol   Drug use: No   Sexual activity: Not on file  Other Topics Concern   Not on file  Social History Narrative   Not on file   Social Determinants of Health   Financial Resource Strain: Low Risk   (04/04/2022)   Overall Financial Resource Strain (CARDIA)    Difficulty of Paying Living Expenses: Not hard at all  Food Insecurity: No Food Insecurity (04/04/2022)   Hunger Vital Sign    Worried About Running Out of Food in the Last Year: Never true    Ran Out of Food in the Last Year: Never true  Transportation Needs: No Transportation Needs (04/04/2022)   PRAPARE - Hydrologist (Medical): No    Lack of Transportation (Non-Medical): No  Physical Activity: Insufficiently Active (04/04/2022)   Exercise Vital Sign    Days of Exercise per Week: 3 days    Minutes of Exercise per Session: 20 min  Stress: No Stress Concern Present (04/04/2022)   [place]    Feeling of Stress : Not at all  Social Connections: Unknown (04/04/2022)   Social Connection and Isolation Panel [NHANES]    Frequency of Communication with Friends and Family: More than three times a week    Frequency of Social Gatherings with Friends and Family: More than three times a week    Attends Religious Services: Not on Advertising copywriter or Organizations: Not on file    Attends Archivist Meetings: Not on file    Marital Status: Not on file     Review of Systems  Constitutional:  Negative for appetite change and unexpected weight change.  HENT:  Negative for congestion and sinus pressure.   Respiratory:  Negative for cough, chest tightness and shortness of breath.   Cardiovascular:  Negative for chest pain, palpitations and leg swelling.  Gastrointestinal:  Negative for abdominal pain, diarrhea, nausea and vomiting.  Genitourinary:  Negative for difficulty urinating and dysuria.  Musculoskeletal:  Negative for myalgias.       Joint pain as outlined.  Elbow improved.   Skin:  Negative for color change and rash.  Neurological:  Negative for dizziness, light-headedness and headaches.  Psychiatric/Behavioral:   Negative for agitation and dysphoric mood.        Objective:     BP 118/76 (BP Location: Left Arm, Patient Position: Sitting, Cuff Size: Large)   Pulse 87   Temp 98.6 F (37 C) (Temporal)   Resp 16   Ht _0  (1.88 m)   Wt 282 lb 6.4 oz (128.1 kg)   SpO2 98%   BMI 36.26 kg/m  Wt Readings from Last 3 Encounters:  09/18/22 282 lb 6.4 oz (128.1 kg)  08/23/22 279 lb 8.7 oz (126.8 kg)  08/02/22 279 lb (126.6 kg)    Physical Exam Vitals reviewed.  Constitutional:      General:  He is not in acute distress.    Appearance: Normal appearance. He is well-developed.  HENT:     Head: Normocephalic and atraumatic.     Right Ear: External ear normal.     Left Ear: External ear normal.  Eyes:     General: No scleral icterus.       Right eye: No discharge.        Left eye: No discharge.     Conjunctiva/sclera: Conjunctivae normal.  Cardiovascular:     Rate and Rhythm: Normal rate and regular rhythm.  Pulmonary:     Effort: Pulmonary effort is normal. No respiratory distress.     Breath sounds: Normal breath sounds.  Abdominal:     General: Bowel sounds are normal.     Palpations: Abdomen is soft.     Tenderness: There is no abdominal tenderness.  Musculoskeletal:        General: No swelling or tenderness.     Cervical back: Neck supple. No tenderness.  Lymphadenopathy:     Cervical: No cervical adenopathy.  Skin:    Findings: No erythema or rash.  Neurological:     Mental Status: He is alert.  Psychiatric:        Mood and Affect: Mood normal.        Behavior: Behavior normal.      Outpatient Encounter Medications as of 09/18/2022  Medication Sig   acetaminophen (TYLENOL) 325 MG tablet Take 650 mg by mouth every 6 (six) hours as needed for pain.   aspirin 81 MG tablet Take 81 mg by mouth daily.   Cholecalciferol (VITAMIN D3) 2000 UNITS TABS Take 1 tablet by mouth daily.   clobetasol cream (TEMOVATE) 3.64 % Apply 1 Application topically 2 (two) times daily.   naproxen  (NAPROSYN) 500 MG tablet Take 500 mg by mouth 2 (two) times daily.   triamcinolone cream (KENALOG) 0.1 % APPLY TO ITCHY AREAS ON ARMS AND SCALP DAILY   [DISCONTINUED] chlorthalidone (HYGROTON) 25 MG tablet Take 1 tablet (25 mg total) by mouth daily.   [DISCONTINUED] felodipine (PLENDIL) 10 MG 24 hr tablet Take 1 tablet (10 mg total) by mouth daily.   [DISCONTINUED] lisinopril (ZESTRIL) 20 MG tablet Take 2 tablets (40 mg total) by mouth daily.   [DISCONTINUED] lovastatin (MEVACOR) 40 MG tablet Take 1 tablet (40 mg total) by mouth daily.   chlorthalidone (HYGROTON) 25 MG tablet Take 1 tablet (25 mg total) by mouth daily.   felodipine (PLENDIL) 10 MG 24 hr tablet Take 1 tablet (10 mg total) by mouth daily.   lisinopril (ZESTRIL) 20 MG tablet Take 2 tablets (40 mg total) by mouth daily.   lovastatin (MEVACOR) 40 MG tablet Take 1 tablet (40 mg total) by mouth daily.   No facility-administered encounter medications on file as of 09/18/2022.     Lab Results  Component Value Date   WBC 12.3 (H) 09/18/2022   HGB 11.4 (L) 09/18/2022   HCT 35.1 (L) 09/18/2022   PLT 321.0 09/18/2022   GLUCOSE 84 09/18/2022   CHOL 109 09/18/2022   TRIG 89.0 09/18/2022   HDL 40.50 09/18/2022   LDLDIRECT 78.0 06/14/2021   LDLCALC 51 09/18/2022   ALT 14 08/16/2022   AST 18 08/16/2022   NA 138 09/18/2022   K 4.1 09/18/2022   CL 103 09/18/2022   CREATININE 1.11 09/18/2022   BUN 17 09/18/2022   CO2 25 09/18/2022   TSH 3.03 03/19/2022   PSA 1.76 06/14/2021   HGBA1C 6.1 09/18/2022  US Abdomen Limited RUQ (LIVER/GB)  Result Date: 03/01/2022 CLINICAL DATA:  Abnormal LFTs.  Elevated ferritin. EXAM: ULTRASOUND ABDOMEN LIMITED RIGHT UPPER QUADRANT COMPARISON:  None Available. FINDINGS: Gallbladder: Gallbladder is mildly contracted with wall thickness measuring 5 mm. Negative sonographic Murphy's sign. No cholelithiasis. Common bile duct: Diameter: 3 mm Liver: Increased echogenicity. No focal lesion. Portal vein is  patent on color Doppler imaging with normal direction of blood flow towards the liver. Other: None. IMPRESSION: Increased hepatic parenchymal echogenicity suggestive of steatosis. Mildly contracted gallbladder without cholelithiasis. Electronically Signed   By: Lovey Newcomer M.D.   On: 03/01/2022 13:32       Assessment & Plan:   Problem List Items Addressed This Visit     Altered mental status    W/up as outlined - cardiology and neurology w/up as outlined.  ECHO and zio monitor - unrevealing.  Plan to d/w neurology regarding driving.  Follow.  No further episodes.        Anemia    Follow cbc. Has seen hematology - 08/2022 - no clear etiology.  May be anemia of chronic disease.       Atherosclerosis of native arteries of extremity with intermittent claudication ([place])    Has been followed by [facility].  Has been stable.  Continue statin.       Relevant Medications   chlorthalidone (HYGROTON) 25 MG tablet   felodipine (PLENDIL) 10 MG 24 hr tablet   lisinopril (ZESTRIL) 20 MG tablet   lovastatin (MEVACOR) 40 MG tablet   Benign essential hypertension    Blood pressure on outside checks have been under reasonable control as outlined.  Continue lisinopril.  Follow metabolic panel.       Relevant Medications   chlorthalidone (HYGROTON) 25 MG tablet   felodipine (PLENDIL) 10 MG 24 hr tablet   lisinopril (ZESTRIL) 20 MG tablet   lovastatin (MEVACOR) 40 MG tablet   CAD (coronary artery disease)    Saw cardiology.  Echo and zio monitor as outlined.  Continue risk factor modification.  Follow.       Relevant Medications   chlorthalidone (HYGROTON) 25 MG tablet   felodipine (PLENDIL) 10 MG 24 hr tablet   lisinopril (ZESTRIL) 20 MG tablet   lovastatin (MEVACOR) 40 MG tablet   Elevated ferritin    Seeing hematology.  W/up in progress as outlined.  Decreased liver rich foods.  No iron supplements.  Follow cbc and iron studies.       History of colonic polyps    Colonoscopy 06/06/20 - one 58m  polyp (transverse colon), one 258mpolyp in the sigmoid colon and one 35m42molyp in the sigmoid colon, diverticulosis.  Per pt, scheduled for f/u colonoscopy.  Recommended f/u colonoscopy in 2 years.       Hypercholesterolemia    On lovastatin.  Low cholesterol diet and exercise.  Follow lipid panel and liver function tests.        Relevant Medications   chlorthalidone (HYGROTON) 25 MG tablet   felodipine (PLENDIL) 10 MG 24 hr tablet   lisinopril (ZESTRIL) 20 MG tablet   lovastatin (MEVACOR) 40 MG tablet   Other Relevant Orders   Lipid panel (Completed)   Hyperglycemia - Primary    Low carb and exercise.  Follow met b and a1c.        Relevant Orders   Hemoglobin A1c (Completed)   Leukocytosis    Recheck cbc to confirm wbc count stable/improved.       Relevant Orders  CBC with Differential/Platelet (Completed)   Peripheral vascular disease ([place])    Has seen [facility].  Continue risk factor modification.        Relevant Medications   chlorthalidone (HYGROTON) 25 MG tablet   felodipine (PLENDIL) 10 MG 24 hr tablet   lisinopril (ZESTRIL) 20 MG tablet   lovastatin (MEVACOR) 40 MG tablet   Right elbow pain    Pain and acute inflammation.  Saw ortho.  Labs - diagnosed with gout.  Once acute flare resolved, allopurinol 181m q day.  Follow.       Other Visit Diagnoses     Essential hypertension, benign       Relevant Medications   chlorthalidone (HYGROTON) 25 MG tablet   felodipine (PLENDIL) 10 MG 24 hr tablet   lisinopril (ZESTRIL) 20 MG tablet   lovastatin (MEVACOR) 40 MG tablet   Other Relevant Orders   Basic metabolic panel (Completed)   Need for influenza vaccination       Relevant Orders   Flu Vaccine QUAD High Dose(Fluad) (Completed)        CEinar Pheasant MD

## 2022-09-19 LAB — CBC WITH DIFFERENTIAL/PLATELET
Basophils Absolute: 0 10*3/uL (ref 0.0–0.1)
Basophils Relative: 0.2 % (ref 0.0–3.0)
Eosinophils Absolute: 0.2 10*3/uL (ref 0.0–0.7)
Eosinophils Relative: 1.2 % (ref 0.0–5.0)
HCT: 35.1 % — ABNORMAL LOW (ref 39.0–52.0)
Hemoglobin: 11.4 g/dL — ABNORMAL LOW (ref 13.0–17.0)
Lymphocytes Relative: 18.4 % (ref 12.0–46.0)
Lymphs Abs: 2.3 10*3/uL (ref 0.7–4.0)
MCHC: 32.5 g/dL (ref 30.0–36.0)
MCV: 79.9 fl (ref 78.0–100.0)
Monocytes Absolute: 1.1 10*3/uL — ABNORMAL HIGH (ref 0.1–1.0)
Monocytes Relative: 9.1 % (ref 3.0–12.0)
Neutro Abs: 8.8 10*3/uL — ABNORMAL HIGH (ref 1.4–7.7)
Neutrophils Relative %: 71.1 % (ref 43.0–77.0)
Platelets: 321 10*3/uL (ref 150.0–400.0)
RBC: 4.39 Mil/uL (ref 4.22–5.81)
RDW: 15.6 % — ABNORMAL HIGH (ref 11.5–15.5)
WBC: 12.3 10*3/uL — ABNORMAL HIGH (ref 4.0–10.5)

## 2022-09-19 LAB — BASIC METABOLIC PANEL
BUN: 17 mg/dL (ref 6–23)
CO2: 25 mEq/L (ref 19–32)
Calcium: 10 mg/dL (ref 8.4–10.5)
Chloride: 103 mEq/L (ref 96–112)
Creatinine, Ser: 1.11 mg/dL (ref 0.40–1.50)
GFR: 66.8 mL/min (ref >60.00)
Glucose, Bld: 84 mg/dL (ref 70–99)
Potassium: 4.1 mEq/L (ref 3.5–5.1)
Sodium: 138 mEq/L (ref 135–145)

## 2022-09-19 LAB — LIPID PANEL
Cholesterol: 109 mg/dL (ref 0–200)
HDL: 40.5 mg/dL (ref >39.00)
LDL Cholesterol: 51 mg/dL (ref 0–99)
NonHDL: 68.89
Total CHOL/HDL Ratio: 3
Triglycerides: 89 mg/dL (ref 0.0–149.0)
VLDL: 17.8 mg/dL (ref 0.0–40.0)

## 2022-09-19 LAB — HEMOGLOBIN A1C: Hgb A1c MFr Bld: 6.1 % (ref 4.6–6.5)

## 2022-09-24 ENCOUNTER — Telehealth: Payer: Self-pay | Admitting: Internal Medicine

## 2022-09-24 NOTE — Telephone Encounter (Signed)
Pt called stating the provider wanted him to call back to discuss his medication

## 2022-09-24 NOTE — Telephone Encounter (Signed)
Pt states he is doing better no pain at all and allopurinol can be sent to [facility] in [place]

## 2022-09-25 MED ORDER — ALLOPURINOL 100 MG PO TABS: 100.0000 mg | ORAL_TABLET | Freq: Every day | ORAL | 3 refills | Status: DC

## 2022-09-25 NOTE — Telephone Encounter (Signed)
Rx sent in for allopurinol '100mg'$  q hs #30 with 3 refills.

## 2022-09-25 NOTE — Addendum Note (Signed)
Addended by: Alisa Graff on: 09/25/2022 03:44 AM   Modules accepted: Orders

## 2022-10-02 ENCOUNTER — Encounter: Payer: Self-pay | Admitting: Internal Medicine

## 2022-10-02 NOTE — Assessment & Plan Note (Addendum)
Blood pressure on outside checks have been under reasonable control as outlined.  Continue lisinopril.  Follow metabolic panel.

## 2022-10-02 NOTE — Assessment & Plan Note (Signed)
Has seen [facility].  Continue risk factor modification.

## 2022-10-02 NOTE — Assessment & Plan Note (Signed)
Colonoscopy 06/06/20 - one 35m polyp (transverse colon), one 235mpolyp in the sigmoid colon and one 38m538molyp in the sigmoid colon, diverticulosis.  Per pt, scheduled for f/u colonoscopy.  Recommended f/u colonoscopy in 2 years.

## 2022-10-02 NOTE — Addendum Note (Signed)
Addended by: Alisa Graff on: 10/02/2022 02:49 PM   Modules accepted: Level of Service

## 2022-10-02 NOTE — Assessment & Plan Note (Signed)
Recheck cbc to confirm wbc count stable/improved.

## 2022-10-02 NOTE — Assessment & Plan Note (Signed)
Low carb and exercise.  Follow met b and a1c.   °

## 2022-10-02 NOTE — Assessment & Plan Note (Signed)
Pain and acute inflammation.  Saw ortho.  Labs - diagnosed with gout.  Once acute flare resolved, allopurinol '100mg'$  q day.  Follow.

## 2022-10-02 NOTE — Assessment & Plan Note (Signed)
Seeing hematology.  W/up in progress as outlined.  Decreased liver rich foods.  No iron supplements.  Follow cbc and iron studies.

## 2022-10-02 NOTE — Assessment & Plan Note (Signed)
Saw cardiology.  Echo and zio monitor as outlined.  Continue risk factor modification.  Follow.

## 2022-10-02 NOTE — Assessment & Plan Note (Signed)
On lovastatin.  Low cholesterol diet and exercise.  Follow lipid panel and liver function tests.   

## 2022-10-02 NOTE — Assessment & Plan Note (Signed)
W/up as outlined - cardiology and neurology w/up as outlined.  ECHO and zio monitor - unrevealing.  Plan to d/w neurology regarding driving.  Follow.  No further episodes.

## 2022-10-02 NOTE — Assessment & Plan Note (Signed)
Has been followed by [facility].  Has been stable.  Continue statin.

## 2022-10-02 NOTE — Assessment & Plan Note (Signed)
Follow cbc. Has seen hematology - 08/2022 - no clear etiology.  May be anemia of chronic disease.

## 2022-10-09 DIAGNOSIS — M109 Gout, unspecified: Secondary | ICD-10-CM | POA: Diagnosis not present

## 2022-10-09 DIAGNOSIS — M1009 Idiopathic gout, multiple sites: Secondary | ICD-10-CM | POA: Diagnosis not present

## 2022-10-09 DIAGNOSIS — M170 Bilateral primary osteoarthritis of knee: Secondary | ICD-10-CM | POA: Diagnosis not present

## 2022-11-13 DIAGNOSIS — M79672 Pain in left foot: Secondary | ICD-10-CM | POA: Diagnosis not present

## 2022-11-13 DIAGNOSIS — M79671 Pain in right foot: Secondary | ICD-10-CM | POA: Diagnosis not present

## 2022-11-13 DIAGNOSIS — G5750 Tarsal tunnel syndrome, unspecified lower limb: Secondary | ICD-10-CM | POA: Diagnosis not present

## 2022-12-20 ENCOUNTER — Encounter: Payer: Self-pay | Admitting: Internal Medicine

## 2022-12-20 ENCOUNTER — Ambulatory Visit (INDEPENDENT_AMBULATORY_CARE_PROVIDER_SITE_OTHER): Payer: Medicare Other | Admitting: Internal Medicine

## 2022-12-20 VITALS — BP 126/80 | HR 86 | Temp 97.9°F | Ht 75.0 in | Wt 280.0 lb

## 2022-12-20 DIAGNOSIS — M25519 Pain in unspecified shoulder: Secondary | ICD-10-CM | POA: Insufficient documentation

## 2022-12-20 DIAGNOSIS — R739 Hyperglycemia, unspecified: Secondary | ICD-10-CM | POA: Diagnosis not present

## 2022-12-20 DIAGNOSIS — M25512 Pain in left shoulder: Secondary | ICD-10-CM | POA: Diagnosis not present

## 2022-12-20 DIAGNOSIS — R7989 Other specified abnormal findings of blood chemistry: Secondary | ICD-10-CM

## 2022-12-20 DIAGNOSIS — Z125 Encounter for screening for malignant neoplasm of prostate: Secondary | ICD-10-CM | POA: Diagnosis not present

## 2022-12-20 DIAGNOSIS — I70213 Atherosclerosis of native arteries of extremities with intermittent claudication, bilateral legs: Secondary | ICD-10-CM

## 2022-12-20 DIAGNOSIS — E7800 Pure hypercholesterolemia, unspecified: Secondary | ICD-10-CM | POA: Diagnosis not present

## 2022-12-20 DIAGNOSIS — D649 Anemia, unspecified: Secondary | ICD-10-CM

## 2022-12-20 DIAGNOSIS — M25511 Pain in right shoulder: Secondary | ICD-10-CM | POA: Diagnosis not present

## 2022-12-20 DIAGNOSIS — I25118 Atherosclerotic heart disease of native coronary artery with other forms of angina pectoris: Secondary | ICD-10-CM

## 2022-12-20 DIAGNOSIS — Z86010 Personal history of colonic polyps: Secondary | ICD-10-CM

## 2022-12-20 DIAGNOSIS — N529 Male erectile dysfunction, unspecified: Secondary | ICD-10-CM

## 2022-12-20 DIAGNOSIS — I10 Essential (primary) hypertension: Secondary | ICD-10-CM | POA: Diagnosis not present

## 2022-12-20 DIAGNOSIS — R55 Syncope and collapse: Secondary | ICD-10-CM

## 2022-12-20 DIAGNOSIS — I739 Peripheral vascular disease, unspecified: Secondary | ICD-10-CM

## 2022-12-20 MED ORDER — ALLOPURINOL 100 MG PO TABS: ORAL_TABLET | ORAL | 3 refills | Status: DC

## 2022-12-20 NOTE — Progress Notes (Signed)
Subjective:    Patient ID: Charles Nichols, male    DOB: 1951/05/28, 72 y.o.   MRN: [id number]  Patient here for  Chief Complaint  Patient presents with   Annual Exam    HPI Here for physical exam. Previous visit had been evaluated for joint pain and swelling - multiple joints.  Saw ortho.  Elevated uric acid level - labs. S/p aspiration - crystals found - diagnosed with gout.  Placed initially on naprosyn.  Now on allopurinol.  Report that a couple of weeks ago, he had a flare - shoulder pain. Took naprosyn. This is better.  No increased shoulder pain, neck pain, arm pain or other joint pain at this time.  He would like to increase the dose of allopurinol. Tries to sty active.  No chest pain or sob.  Recent cardiac w/up unrevealing.  No cough or congestion.  No abdominal pain or bowel change reported.  Had colonoscopy recently.  Need results.  Was questioning restarting cialis.  No syncope or near syncope.  No headache or dizziness.     Past Medical History:  Diagnosis Date   Arthritis    Elevated ferritin    Negative work-up for hemochromatosis   History of chicken pox    Hypertension    Obesity    Peripheral vascular disease ([place])    Past Surgical History:  Procedure Laterality Date   CIRCUMCISION     COLONOSCOPY WITH PROPOFOL N/A 04/08/2015   Procedure: COLONOSCOPY WITH PROPOFOL;  Surgeon: Lollie Sails, MD;  Location: [facility] ENDOSCOPY;  Service: Endoscopy;  Laterality: N/A;   ESOPHAGOGASTRODUODENOSCOPY N/A 04/08/2015   Procedure: ESOPHAGOGASTRODUODENOSCOPY (EGD);  Surgeon: Lollie Sails, MD;  Location: [facility] ENDOSCOPY;  Service: Endoscopy;  Laterality: N/A;   FEMORAL-POPLITEAL BYPASS GRAFT Bilateral 1997, 2000   leg stent     pvd   Family History  Problem Relation Age of Onset   Breast cancer Sister    Hypertension Sister    Hypertension Brother    Breast cancer Daughter    Social History   Socioeconomic History   Marital status: Legally Separated    Spouse name:  Not on file   Number of children: Not on file   Years of education: Not on file   Highest education level: Not on file  Occupational History   Not on file  Tobacco Use   Smoking status: Former   Smokeless tobacco: Never  Vaping Use   Vaping Use: Never used  Substance and Sexual Activity   Alcohol use: Yes    Alcohol/week: 0.0 standard drinks of alcohol   Drug use: No   Sexual activity: Not on file  Other Topics Concern   Not on file  Social History Narrative   Not on file   Social Determinants of Health   Financial Resource Strain: Low Risk  (04/04/2022)   Overall Financial Resource Strain (CARDIA)    Difficulty of Paying Living Expenses: Not hard at all  Food Insecurity: No Food Insecurity (04/04/2022)   Hunger Vital Sign    Worried About Running Out of Food in the Last Year: Never true    Ran Out of Food in the Last Year: Never true  Transportation Needs: No Transportation Needs (04/04/2022)   PRAPARE - Hydrologist (Medical): No    Lack of Transportation (Non-Medical): No  Physical Activity: Insufficiently Active (04/04/2022)   Exercise Vital Sign    Days of Exercise per Week: 3 days  Minutes of Exercise per Session: 20 min  Stress: No Stress Concern Present (04/04/2022)   [place]    Feeling of Stress : Not at all  Social Connections: Unknown (04/04/2022)   Social Connection and Isolation Panel [NHANES]    Frequency of Communication with Friends and Family: More than three times a week    Frequency of Social Gatherings with Friends and Family: More than three times a week    Attends Religious Services: Not on Advertising copywriter or Organizations: Not on file    Attends Archivist Meetings: Not on file    Marital Status: Not on file     Review of Systems  Constitutional:  Negative for appetite change and unexpected weight change.  HENT:  Negative  for congestion and sinus pressure.   Respiratory:  Negative for cough, chest tightness and shortness of breath.   Cardiovascular:  Negative for chest pain and palpitations.  Gastrointestinal:  Negative for abdominal pain, diarrhea, nausea and vomiting.  Genitourinary:  Negative for difficulty urinating and dysuria.  Musculoskeletal:  Negative for joint swelling and myalgias.  Skin:  Negative for color change and rash.  Neurological:  Negative for dizziness and headaches.  Psychiatric/Behavioral:  Negative for agitation and dysphoric mood.        Objective:     BP 126/80   Pulse 86   Temp 97.9 F (36.6 C) (Oral)   Ht 6\' 3"  (1.905 m)   Wt 280 lb (127 kg)   SpO2 97%   BMI 35.00 kg/m  Wt Readings from Last 3 Encounters:  12/20/22 280 lb (127 kg)  09/18/22 282 lb 6.4 oz (128.1 kg)  08/23/22 279 lb 8.7 oz (126.8 kg)    Physical Exam Constitutional:      General: He is not in acute distress.    Appearance: Normal appearance. He is well-developed.  HENT:     Head: Normocephalic and atraumatic.     Right Ear: External ear normal.     Left Ear: External ear normal.  Eyes:     General: No scleral icterus.       Right eye: No discharge.        Left eye: No discharge.  Cardiovascular:     Rate and Rhythm: Normal rate and regular rhythm.  Pulmonary:     Effort: Pulmonary effort is normal. No respiratory distress.     Breath sounds: Normal breath sounds.  Abdominal:     General: Bowel sounds are normal.     Palpations: Abdomen is soft.     Tenderness: There is no abdominal tenderness.  Musculoskeletal:        General: No swelling or tenderness.     Cervical back: Neck supple. No tenderness.  Lymphadenopathy:     Cervical: No cervical adenopathy.  Skin:    Findings: No erythema or rash.  Neurological:     Mental Status: He is alert.  Psychiatric:        Mood and Affect: Mood normal.        Behavior: Behavior normal.      Outpatient Encounter Medications as of  12/20/2022  Medication Sig   acetaminophen (TYLENOL) 325 MG tablet Take 650 mg by mouth every 6 (six) hours as needed for pain.   aspirin 81 MG tablet Take 81 mg by mouth daily.   chlorthalidone (HYGROTON) 25 MG tablet Take 1 tablet (25 mg total) by mouth daily.  Cholecalciferol (VITAMIN D3) 2000 UNITS TABS Take 1 tablet by mouth daily.   clobetasol cream (TEMOVATE) [id number] % Apply 1 Application topically 2 (two) times daily.   felodipine (PLENDIL) 10 MG 24 hr tablet Take 1 tablet (10 mg total) by mouth daily.   lisinopril (ZESTRIL) 20 MG tablet Take 2 tablets (40 mg total) by mouth daily.   lovastatin (MEVACOR) 40 MG tablet Take 1 tablet (40 mg total) by mouth daily.   naproxen (NAPROSYN) 500 MG tablet Take 500 mg by mouth 2 (two) times daily.   triamcinolone cream (KENALOG) 0.1 % APPLY TO ITCHY AREAS ON ARMS AND SCALP DAILY   [DISCONTINUED] allopurinol (ZYLOPRIM) 100 MG tablet Take 1 tablet (100 mg total) by mouth daily.   allopurinol (ZYLOPRIM) 100 MG tablet Take 2 tablets per day   No facility-administered encounter medications on file as of 12/20/2022.     Lab Results  Component Value Date   WBC 9.4 12/21/2022   HGB 12.6 (L) 12/21/2022   HCT 38.6 (L) 12/21/2022   PLT 242.0 12/21/2022   GLUCOSE 94 12/20/2022   CHOL 121 12/20/2022   TRIG 104.0 12/20/2022   HDL 41.50 12/20/2022   LDLDIRECT 78.0 06/14/2021   LDLCALC 58 12/20/2022   ALT 15 12/20/2022   AST 17 12/20/2022   NA 137 12/20/2022   K 4.0 12/20/2022   CL 100 12/20/2022   CREATININE 1.13 12/20/2022   BUN 13 12/20/2022   CO2 27 12/20/2022   TSH 3.03 03/19/2022   PSA 1.92 12/20/2022   HGBA1C 6.0 12/20/2022    US Abdomen Limited RUQ (LIVER/GB)  Result Date: 03/01/2022 CLINICAL DATA:  Abnormal LFTs.  Elevated ferritin. EXAM: ULTRASOUND ABDOMEN LIMITED RIGHT UPPER QUADRANT COMPARISON:  None Available. FINDINGS: Gallbladder: Gallbladder is mildly contracted with wall thickness measuring 5 mm. Negative sonographic Murphy's  sign. No cholelithiasis. Common bile duct: Diameter: 3 mm Liver: Increased echogenicity. No focal lesion. Portal vein is patent on color Doppler imaging with normal direction of blood flow towards the liver. Other: None. IMPRESSION: Increased hepatic parenchymal echogenicity suggestive of steatosis. Mildly contracted gallbladder without cholelithiasis. Electronically Signed   By: Lovey Newcomer M.D.   On: 03/01/2022 13:32       Assessment & Plan:  Hyperglycemia Assessment & Plan: Low carb and exercise.  Follow met b and a1c.    Orders: -     Hemoglobin A1c  Hypercholesterolemia Assessment & Plan: On lovastatin.  Low cholesterol diet and exercise.  Follow lipid panel and liver function tests.    Orders: -     Hepatic function panel -     Lipid panel  Benign essential hypertension Assessment & Plan: Blood pressure on outside checks have been under reasonable control as outlined.  Continue lisinopril.  Follow metabolic panel.   Orders: -     Basic metabolic panel  Bilateral shoulder pain, unspecified chronicity Assessment & Plan: Reported recent flare of shoulder pain.  Discussed.  Is better.  Check ESR.  Has seen hematology and rheumatology.  Discussed with ortho regarding possible diagnosis of PMR.  Recently diagnosed with gout - crystals present in joint aspiration.  No pain now.  Specifically denies any headache, shoulder or neck pain or other joint pains.  Will hold on steroids. Discussed with ortho.  Ortho to place referral to rheumatology.    Orders: -     Sedimentation rate  Prostate cancer screening -     PSA, Medicare  Anemia, unspecified type Assessment & Plan: Saw hematology 08/2022 -  No clear etiology of anemia at this time.  May be anemia of chronic disease.    Atherosclerosis of native artery of both lower extremities with intermittent claudication ([place]) Assessment & Plan: Has been followed by [facility].  Has been stable.  Continue statin.    Coronary artery disease  of native artery of native heart with stable angina pectoris [facility]) Assessment & Plan: Saw cardiology.  Echo and zio monitor as outlined previously.  Continue risk factor modification.  Follow.    Elevated ferritin Assessment & Plan: Has seen hematology.  W/up as outlined previously.  Decreased liver rich foods.  No iron supplements.  Follow cbc and iron studies.  Felt to be acute phase reaction.    Erectile dysfunction, unspecified erectile dysfunction type Assessment & Plan: Previously tried cialis.  Discussed restarting.    History of colonic polyps Assessment & Plan: Colonoscopy 06/06/20 - one 36mm polyp (transverse colon), one 62mm polyp in the sigmoid colon and one 70mm polyp in the sigmoid colon, diverticulosis.  Per pt, scheduled for f/u colonoscopy.  Recommended f/u colonoscopy in 2 years. Had f/u colonoscopy.  Need results.    Peripheral vascular disease ([place]) Assessment & Plan: Has seen [facility].  Continue risk factor modification.     Syncope, unspecified syncope type Assessment & Plan: Had cardiology and neurology w/up. No underlying neurological or cardiology etiology found.  No further episodes.  Has quit drinking.  Follow     Other orders -     Allopurinol; Take 2 tablets per day  Dispense: 60 tablet; Refill: 3     Einar Pheasant, MD

## 2022-12-21 ENCOUNTER — Other Ambulatory Visit (INDEPENDENT_AMBULATORY_CARE_PROVIDER_SITE_OTHER): Payer: Medicare Other

## 2022-12-21 ENCOUNTER — Other Ambulatory Visit: Payer: Self-pay | Admitting: Internal Medicine

## 2022-12-21 ENCOUNTER — Telehealth: Payer: Self-pay

## 2022-12-21 ENCOUNTER — Encounter: Payer: Self-pay | Admitting: Internal Medicine

## 2022-12-21 DIAGNOSIS — D72829 Elevated white blood cell count, unspecified: Secondary | ICD-10-CM

## 2022-12-21 LAB — PSA, MEDICARE: PSA: 1.92 ng/ml (ref 0.10–4.00)

## 2022-12-21 LAB — BASIC METABOLIC PANEL
BUN: 13 mg/dL (ref 6–23)
CO2: 27 mEq/L (ref 19–32)
Calcium: 9.7 mg/dL (ref 8.4–10.5)
Chloride: 100 mEq/L (ref 96–112)
Creatinine, Ser: 1.13 mg/dL (ref 0.40–1.50)
GFR: 65.27 mL/min (ref >60.00)
Glucose, Bld: 94 mg/dL (ref 70–99)
Potassium: 4 mEq/L (ref 3.5–5.1)
Sodium: 137 mEq/L (ref 135–145)

## 2022-12-21 LAB — CBC WITH DIFFERENTIAL/PLATELET
Basophils Absolute: 0.1 10*3/uL (ref 0.0–0.1)
Basophils Relative: 1.5 % (ref 0.0–3.0)
Eosinophils Absolute: 0.2 10*3/uL (ref 0.0–0.7)
Eosinophils Relative: 2.1 % (ref 0.0–5.0)
HCT: 38.6 % — ABNORMAL LOW (ref 39.0–52.0)
Hemoglobin: 12.6 g/dL — ABNORMAL LOW (ref 13.0–17.0)
Lymphocytes Relative: 22.4 % (ref 12.0–46.0)
Lymphs Abs: 2.1 10*3/uL (ref 0.7–4.0)
MCHC: 32.5 g/dL (ref 30.0–36.0)
MCV: 79.4 fl (ref 78.0–100.0)
Monocytes Absolute: 1 10*3/uL (ref 0.1–1.0)
Monocytes Relative: 10.3 % (ref 3.0–12.0)
Neutro Abs: 6 10*3/uL (ref 1.4–7.7)
Neutrophils Relative %: 63.7 % (ref 43.0–77.0)
Platelets: 242 10*3/uL (ref 150.0–400.0)
RBC: 4.86 Mil/uL (ref 4.22–5.81)
RDW: 15.3 % (ref 11.5–15.5)
WBC: 9.4 10*3/uL (ref 4.0–10.5)

## 2022-12-21 LAB — HEPATIC FUNCTION PANEL
ALT: 15 U/L (ref 0–53)
AST: 17 U/L (ref 0–37)
Albumin: 4 g/dL (ref 3.5–5.2)
Alkaline Phosphatase: 69 U/L (ref 39–117)
Bilirubin, Direct: 0.1 mg/dL (ref 0.0–0.3)
Total Bilirubin: 0.3 mg/dL (ref 0.2–1.2)
Total Protein: 7.2 g/dL (ref 6.0–8.3)

## 2022-12-21 LAB — LIPID PANEL
Cholesterol: 121 mg/dL (ref 0–200)
HDL: 41.5 mg/dL (ref >39.00)
LDL Cholesterol: 58 mg/dL (ref 0–99)
NonHDL: 79.23
Total CHOL/HDL Ratio: 3
Triglycerides: 104 mg/dL (ref 0.0–149.0)
VLDL: 20.8 mg/dL (ref 0.0–40.0)

## 2022-12-21 LAB — HEMOGLOBIN A1C: Hgb A1c MFr Bld: 6 % (ref 4.6–6.5)

## 2022-12-21 LAB — SEDIMENTATION RATE: Sed Rate: 92 mm/hr — ABNORMAL HIGH (ref 0–20)

## 2022-12-21 NOTE — Progress Notes (Signed)
Order placed for cbc (add on).

## 2022-12-21 NOTE — Telephone Encounter (Signed)
Patient states he is calling back to let Dr. Einar Pheasant know that Dr. Lavella Hammock did his colonoscopy.  Patient states Dr. Apolonio Schneiders phone number is [phone]).

## 2022-12-21 NOTE — Telephone Encounter (Signed)
Letter sent requesting colonoscopy records form [facility] Dr. Lavella Hammock.

## 2022-12-27 ENCOUNTER — Telehealth: Payer: Self-pay | Admitting: Internal Medicine

## 2022-12-27 ENCOUNTER — Telehealth: Payer: Self-pay

## 2022-12-27 NOTE — Telephone Encounter (Signed)
Patient would like Dr Nicki Reaper to call him about papers that were sent to the [facility].

## 2022-12-27 NOTE — Telephone Encounter (Signed)
-----   Message from Einar Pheasant, MD sent at 12/26/2022  5:34 AM EDT ----- Charles Nichols is already aware of lab results.  I discussed with him (see last result note).  I had informed him that I would contact ortho and hematology regarding further w/up.  After discussion with ortho, it is recommended for him to see rheumatology.  [name] (through ortho) is going to place a referral to rheumatology for further evaluation.  He should be getting a call from them regarding an appt.  Let us know if does not hear about appt and let us know if any pain or problems.

## 2022-12-27 NOTE — Telephone Encounter (Signed)
LMTCB

## 2022-12-28 NOTE — Telephone Encounter (Signed)
Patient states he is following up on his message from yesterday.  Patient states the DMV needs his paperwork and he would like to speak with Dr. Einar Pheasant.

## 2022-12-28 NOTE — Telephone Encounter (Signed)
Patient is going to call [facility] Monday morning.

## 2022-12-28 NOTE — Telephone Encounter (Signed)
See me about this. Patient says that he has been having work up done to determine if he can drive again and there was paperwork that was sent to the [facility]. Patient says that he received a letter stating medical board has not received all records.

## 2022-12-28 NOTE — Telephone Encounter (Signed)
I have not received anything from [facility].  Have him call DMV and see what is needed.

## 2022-12-30 ENCOUNTER — Encounter: Payer: Self-pay | Admitting: Internal Medicine

## 2022-12-30 NOTE — Assessment & Plan Note (Signed)
Saw hematology 08/2022 - No clear etiology of anemia at this time.  May be anemia of chronic disease.

## 2022-12-30 NOTE — Assessment & Plan Note (Signed)
Had cardiology and neurology w/up. No underlying neurological or cardiology etiology found.  No further episodes.  Has quit drinking.  Follow

## 2022-12-30 NOTE — Assessment & Plan Note (Signed)
Low carb and exercise.  Follow met b and a1c.   

## 2022-12-30 NOTE — Assessment & Plan Note (Signed)
Has seen [facility].  Continue risk factor modification.   

## 2022-12-30 NOTE — Assessment & Plan Note (Addendum)
Has seen hematology.  W/up as outlined previously.  Decreased liver rich foods.  No iron supplements.  Follow cbc and iron studies.  Felt to be acute phase reaction.
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
## 2022-12-30 NOTE — Assessment & Plan Note (Signed)
Reported recent flare of shoulder pain.  Discussed.  Is better.  Check ESR.  Has seen hematology and rheumatology.  Discussed with ortho regarding possible diagnosis of PMR.  Recently diagnosed with gout - crystals present in joint aspiration.  No pain now.  Specifically denies any headache, shoulder or neck pain or other joint pains.  Will hold on steroids. Discussed with ortho.  Ortho to place referral to rheumatology.

## 2022-12-30 NOTE — Assessment & Plan Note (Signed)
Blood pressure on outside checks have been under reasonable control as outlined.  Continue lisinopril.  Follow metabolic panel.  

## 2022-12-30 NOTE — Assessment & Plan Note (Signed)
Colonoscopy 06/06/20 - one 64mm polyp (transverse colon), one 6mm polyp in the sigmoid colon and one 35mm polyp in the sigmoid colon, diverticulosis.  Per pt, scheduled for f/u colonoscopy.  Recommended f/u colonoscopy in 2 years. Had f/u colonoscopy.  Need results.

## 2022-12-30 NOTE — Assessment & Plan Note (Signed)
On lovastatin.  Low cholesterol diet and exercise.  Follow lipid panel and liver function tests.   

## 2022-12-30 NOTE — Assessment & Plan Note (Signed)
Has been followed by [facility].  Has been stable.  Continue statin.  

## 2022-12-30 NOTE — Assessment & Plan Note (Signed)
Previously tried cialis.  Discussed restarting.

## 2022-12-30 NOTE — Assessment & Plan Note (Signed)
Saw cardiology.  Echo and zio monitor as outlined previously.  Continue risk factor modification.  Follow.

## 2022-12-31 ENCOUNTER — Ambulatory Visit: Payer: Medicare Other | Attending: Internal Medicine | Admitting: Internal Medicine

## 2022-12-31 ENCOUNTER — Encounter: Payer: Self-pay | Admitting: Internal Medicine

## 2022-12-31 VITALS — BP 158/78 | HR 87 | Resp 14 | Ht 73.0 in | Wt 282.0 lb

## 2022-12-31 DIAGNOSIS — M1A09X0 Idiopathic chronic gout, multiple sites, without tophus (tophi): Secondary | ICD-10-CM

## 2022-12-31 DIAGNOSIS — R700 Elevated erythrocyte sedimentation rate: Secondary | ICD-10-CM | POA: Insufficient documentation

## 2022-12-31 DIAGNOSIS — M21371 Foot drop, right foot: Secondary | ICD-10-CM | POA: Diagnosis not present

## 2022-12-31 DIAGNOSIS — M1009 Idiopathic gout, multiple sites: Secondary | ICD-10-CM | POA: Insufficient documentation

## 2022-12-31 NOTE — Patient Instructions (Signed)

## 2023-01-01 ENCOUNTER — Telehealth: Payer: Self-pay | Admitting: *Deleted

## 2023-01-01 DIAGNOSIS — R700 Elevated erythrocyte sedimentation rate: Secondary | ICD-10-CM

## 2023-01-01 DIAGNOSIS — M1A09X0 Idiopathic chronic gout, multiple sites, without tophus (tophi): Secondary | ICD-10-CM

## 2023-01-01 NOTE — Telephone Encounter (Signed)
-----   Message from Collier Salina, MD sent at 12/31/2022  9:45 PM EDT ----- Regarding: Future labs Charles Nichols will need to recheck uric acid as well as sedimentation rate and c-reactive protein. He lives in [place], [place]. if sending order to local lab or at the hospital near there is possible would be easier for him. If not, says he would be able to come here again too. Recommending labs to be 4/15 or later.

## 2023-01-08 DIAGNOSIS — M170 Bilateral primary osteoarthritis of knee: Secondary | ICD-10-CM | POA: Diagnosis not present

## 2023-01-24 DIAGNOSIS — M21371 Foot drop, right foot: Secondary | ICD-10-CM | POA: Diagnosis not present

## 2023-01-24 DIAGNOSIS — R29898 Other symptoms and signs involving the musculoskeletal system: Secondary | ICD-10-CM | POA: Diagnosis not present

## 2023-01-24 DIAGNOSIS — R4182 Altered mental status, unspecified: Secondary | ICD-10-CM | POA: Diagnosis not present

## 2023-01-24 DIAGNOSIS — R4189 Other symptoms and signs involving cognitive functions and awareness: Secondary | ICD-10-CM | POA: Diagnosis not present

## 2023-02-05 DIAGNOSIS — M7751 Other enthesopathy of right foot: Secondary | ICD-10-CM | POA: Diagnosis not present

## 2023-02-05 DIAGNOSIS — G5750 Tarsal tunnel syndrome, unspecified lower limb: Secondary | ICD-10-CM | POA: Diagnosis not present

## 2023-02-05 DIAGNOSIS — M7752 Other enthesopathy of left foot: Secondary | ICD-10-CM | POA: Diagnosis not present

## 2023-02-05 DIAGNOSIS — M79671 Pain in right foot: Secondary | ICD-10-CM | POA: Diagnosis not present
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
## 2023-03-01 ENCOUNTER — Encounter: Payer: Self-pay | Admitting: Internal Medicine

## 2023-03-01 ENCOUNTER — Ambulatory Visit: Payer: Medicare Other | Attending: Internal Medicine | Admitting: Internal Medicine

## 2023-03-01 VITALS — BP 149/77 | HR 89 | Resp 14 | Ht 75.0 in | Wt 280.0 lb

## 2023-03-01 DIAGNOSIS — M1A09X0 Idiopathic chronic gout, multiple sites, without tophus (tophi): Secondary | ICD-10-CM | POA: Diagnosis not present

## 2023-03-01 DIAGNOSIS — M25512 Pain in left shoulder: Secondary | ICD-10-CM | POA: Diagnosis not present

## 2023-03-01 DIAGNOSIS — M25511 Pain in right shoulder: Secondary | ICD-10-CM | POA: Diagnosis not present

## 2023-03-02 LAB — BASIC METABOLIC PANEL WITH GFR
BUN: 17 mg/dL (ref 7–25)
CO2: 28 mmol/L (ref 20–32)
Calcium: 9.6 mg/dL (ref 8.6–10.3)
Chloride: 102 mmol/L (ref 98–110)
Creat: 0.93 mg/dL (ref 0.70–1.28)
Glucose, Bld: 98 mg/dL (ref 65–99)
Potassium: 4 mmol/L (ref 3.5–5.3)
Sodium: 138 mmol/L (ref 135–146)
eGFR: 88 mL/min/{1.73_m2} (ref >=60)

## 2023-03-02 LAB — SEDIMENTATION RATE: Sed Rate: 38 mm/h — ABNORMAL HIGH (ref 0–20)

## 2023-03-02 LAB — URIC ACID: Uric Acid, Serum: 6.3 mg/dL (ref 4.0–8.0)

## 2023-03-09 DIAGNOSIS — G8929 Other chronic pain: Secondary | ICD-10-CM | POA: Diagnosis not present

## 2023-03-10 MED ORDER — ALLOPURINOL 300 MG PO TABS: 300.0000 mg | ORAL_TABLET | Freq: Every day | ORAL | 0 refills | Status: DC

## 2023-03-20 DIAGNOSIS — L280 Lichen simplex chronicus: Secondary | ICD-10-CM | POA: Diagnosis not present

## 2023-04-02 DIAGNOSIS — M79671 Pain in right foot: Secondary | ICD-10-CM | POA: Diagnosis not present

## 2023-04-02 DIAGNOSIS — M79672 Pain in left foot: Secondary | ICD-10-CM | POA: Diagnosis not present

## 2023-04-02 DIAGNOSIS — G5750 Tarsal tunnel syndrome, unspecified lower limb: Secondary | ICD-10-CM | POA: Diagnosis not present

## 2023-04-15 ENCOUNTER — Ambulatory Visit (INDEPENDENT_AMBULATORY_CARE_PROVIDER_SITE_OTHER): Payer: Medicare Other

## 2023-04-15 VITALS — Ht 75.0 in | Wt 271.0 lb

## 2023-04-15 DIAGNOSIS — Z0000 Encounter for general adult medical examination without abnormal findings: Secondary | ICD-10-CM

## 2023-04-15 DIAGNOSIS — G8929 Other chronic pain: Secondary | ICD-10-CM | POA: Diagnosis not present

## 2023-04-15 DIAGNOSIS — M1712 Unilateral primary osteoarthritis, left knee: Secondary | ICD-10-CM | POA: Diagnosis not present

## 2023-04-15 DIAGNOSIS — M7542 Impingement syndrome of left shoulder: Secondary | ICD-10-CM | POA: Diagnosis not present

## 2023-04-15 DIAGNOSIS — M170 Bilateral primary osteoarthritis of knee: Secondary | ICD-10-CM | POA: Diagnosis not present

## 2023-04-15 NOTE — Progress Notes (Signed)
Subjective:   Charles Nichols is a 72 y.o. male who presents for Medicare Annual/Subsequent preventive examination.  Visit Complete: Virtual  I connected with  [name] on 04/15/23 by a audio enabled telemedicine application and verified that I am speaking with the correct person using two identifiers.  Patient Location: Home  Provider Location: Home Office  I discussed the limitations of evaluation and management by telemedicine. The patient expressed understanding and agreed to proceed.  Patient Medicare AWV questionnaire was completed by the patient on 04/15/2023; I have confirmed that all information answered by patient is correct and no changes since this date.  Review of Systems     Cardiac Risk Factors include: advanced age (>58men, >14 women);dyslipidemia;hypertension;male gender     Objective:    Today's Vitals   04/15/23 1403  Weight: 271 lb (122.9 kg)  Height: 6\' 3"  (1.905 m)   Body mass index is 33.87 kg/m.     04/15/2023    2:07 PM 08/23/2022   10:06 AM 04/04/2022    1:17 PM 02/20/2022    8:11 AM 01/26/2022   10:18 AM 04/03/2021   12:59 PM 03/31/2020   12:41 PM  Advanced Directives  Does Patient Have a Medical Advance Directive? No No No No No No No  Would patient like information on creating a medical advance directive? No - Patient declined No - Patient declined No - Patient declined No - Patient declined No - Patient declined No - Patient declined No - Patient declined    Current Medications (verified) Outpatient Encounter Medications as of 04/15/2023  Medication Sig   acetaminophen (TYLENOL) 325 MG tablet Take 650 mg by mouth every 6 (six) hours as needed for pain.   acetaminophen (TYLENOL) 650 MG CR tablet Take 650 mg by mouth every 8 (eight) hours as needed for pain.   allopurinol (ZYLOPRIM) 300 MG tablet Take 1 tablet (300 mg total) by mouth daily.   aspirin 81 MG tablet Take 81 mg by mouth daily.   chlorthalidone (HYGROTON) 25 MG tablet Take 1  tablet (25 mg total) by mouth daily.   Cholecalciferol (VITAMIN D3) 2000 UNITS TABS Take 1 tablet by mouth daily.   clobetasol cream (TEMOVATE) 0.05 % Apply 1 Application topically 2 (two) times daily.   felodipine (PLENDIL) 10 MG 24 hr tablet Take 1 tablet (10 mg total) by mouth daily.   lisinopril (ZESTRIL) 20 MG tablet Take 2 tablets (40 mg total) by mouth daily.   lovastatin (MEVACOR) 40 MG tablet Take 1 tablet (40 mg total) by mouth daily.   naproxen (NAPROSYN) 500 MG tablet Take 500 mg by mouth 2 (two) times daily.   triamcinolone cream (KENALOG) 0.1 % APPLY TO ITCHY AREAS ON ARMS AND SCALP DAILY   No facility-administered encounter medications on file as of 04/15/2023.    Allergies (verified) Patient has no known allergies.   History: Past Medical History:  Diagnosis Date   Arthritis    Elevated ferritin    Negative work-up for hemochromatosis   History of chicken pox    Hypertension    Obesity    Peripheral vascular disease (HCC)    Past Surgical History:  Procedure Laterality Date   CIRCUMCISION     COLONOSCOPY WITH PROPOFOL N/A 04/08/2015   Procedure: COLONOSCOPY WITH PROPOFOL;  Surgeon: Christena Deem, MD;  Location: [facility] ENDOSCOPY;  Service: Endoscopy;  Laterality: N/A;   ESOPHAGOGASTRODUODENOSCOPY N/A 04/08/2015   Procedure: ESOPHAGOGASTRODUODENOSCOPY (EGD);  Surgeon: Christena Deem, MD;  Location:  [facility] ENDOSCOPY;  Service: Endoscopy;  Laterality: N/A;   FEMORAL-POPLITEAL BYPASS GRAFT Bilateral 1997, 2000   leg stent     pvd   Family History  Problem Relation Age of Onset   Breast cancer Sister    Hypertension Sister    Hypertension Brother    Breast cancer Daughter    Social History   Socioeconomic History   Marital status: Legally Separated    Spouse name: Not on file   Number of children: Not on file   Years of education: Not on file   Highest education level: Not on file  Occupational History   Not on file  Tobacco Use   Smoking status: Former     Types: Cigarettes    Quit date: 1997    Years since quitting: 27.5    Passive exposure: Never   Smokeless tobacco: Never  Vaping Use   Vaping Use: Never used  Substance and Sexual Activity   Alcohol use: Not Currently   Drug use: No   Sexual activity: Not on file  Other Topics Concern   Not on file  Social History Narrative   Not on file   Social Determinants of Health   Financial Resource Strain: Low Risk  (04/15/2023)   Overall Financial Resource Strain (CARDIA)    Difficulty of Paying Living Expenses: Not hard at all  Food Insecurity: No Food Insecurity (04/15/2023)   Hunger Vital Sign    Worried About Running Out of Food in the Last Year: Never true    Ran Out of Food in the Last Year: Never true  Transportation Needs: No Transportation Needs (04/15/2023)   PRAPARE - Administrator, Civil Service (Medical): No    Lack of Transportation (Non-Medical): No  Physical Activity: Insufficiently Active (04/15/2023)   Exercise Vital Sign    Days of Exercise per Week: 2 days    Minutes of Exercise per Session: 30 min  Stress: No Stress Concern Present (04/15/2023)   [employer] of Occupational Health - Occupational Stress Questionnaire    Feeling of Stress : Not at all  Social Connections: Moderately Isolated (04/15/2023)   Social Connection and Isolation Panel [NHANES]    Frequency of Communication with Friends and Family: More than three times a week    Frequency of Social Gatherings with Friends and Family: More than three times a week    Attends Religious Services: More than 4 times per year    Active Member of [employer] or Organizations: No    Attends Banker Meetings: Never    Marital Status: Separated    Tobacco Counseling Counseling given: Not Answered   Clinical Intake:  Pre-visit preparation completed: Yes  Pain : No/denies pain     Nutritional Risks: None Diabetes: No  How often do you need to have someone help you when you read  instructions, pamphlets, or other written materials from your doctor or pharmacy?: 1 - Never  Interpreter Needed?: No  Information entered by :: Renie Ora, LPN   Activities of Daily Living    04/15/2023    2:07 PM  In your present state of health, do you have any difficulty performing the following activities:  Hearing? 0  Vision? 0  Difficulty concentrating or making decisions? 0  Walking or climbing stairs? 0  Dressing or bathing? 0  Doing errands, shopping? 0  Preparing Food and eating ? N  Using the Toilet? N  In the past six months, have you accidently leaked urine?  N  Do you have problems with loss of bowel control? N  Managing your Medications? N  Managing your Finances? N  Housekeeping or managing your Housekeeping? N    Patient Care Team: Dale [place], MD as PCP - General (Internal Medicine) Jonelle Sidle, MD as PCP - Cardiology (Cardiology) Doreatha Massed, MD as Medical Oncologist (Hematology)  Indicate any recent Medical Services you may have received from other than [facility] providers in the past year (date may be approximate).     Assessment:   This is a routine wellness examination for Brendt.  Hearing/Vision screen Vision Screening - Comments:: Wears rx glasses - up to date with routine eye exams with  My eye doctor   Dietary issues and exercise activities discussed:     Goals Addressed               This Visit's Progress     Increase activity (pt-stated)   On track     Stay active, as tolerated       Depression Screen    04/15/2023    2:05 PM 12/20/2022    2:35 PM 09/18/2022    1:04 PM 05/22/2022    4:05 PM 04/04/2022    1:14 PM 03/19/2022   11:39 AM 10/17/2021   11:40 AM  PHQ 2/9 Scores  PHQ - 2 Score 0 0 0 0 0 0 0  PHQ- 9 Score  0         Fall Risk    04/15/2023    2:03 PM 12/20/2022    2:35 PM 09/18/2022    1:04 PM 05/22/2022    4:05 PM 04/04/2022    1:19 PM  Fall Risk   Falls in the past year? 0 0 0 0 0  Number falls  in past yr: 0 0 0 0   Injury with Fall? 0 0 0 0   Risk for fall due to : No Fall Risks No Fall Risks No Fall Risks No Fall Risks   Follow up Falls prevention discussed Falls evaluation completed Falls evaluation completed Falls evaluation completed Falls evaluation completed    MEDICARE RISK AT HOME:  Medicare Risk at Home - 04/15/23 1404     Any stairs in or around the home? Yes    If so, are there any without handrails? No    Home free of loose throw rugs in walkways, pet beds, electrical cords, etc? Yes    Adequate lighting in your home to reduce risk of falls? Yes    Life alert? No    Use of a cane, walker or w/c? Yes    Grab bars in the bathroom? Yes    Shower chair or bench in shower? Yes    Elevated toilet seat or a handicapped toilet? Yes             TIMED UP AND GO:  Was the test performed?  No    Cognitive Function:    03/27/2018    2:17 PM  MMSE - Mini Mental State Exam  Orientation to time 5  Orientation to Place 5  Registration 3  Attention/ Calculation 5  Recall 3  Language- name 2 objects 2  Language- repeat 1  Language- follow 3 step command 3  Language- read & follow direction 1  Write a sentence 1  Copy design 1  Total score 30        04/15/2023    2:07 PM 04/03/2021    1:18 PM 03/31/2020  1:01 PM 03/30/2019    2:18 PM  6CIT Screen  What Year? 0 points 0 points 0 points 0 points  What month? 0 points 0 points 0 points 0 points  What time? 0 points 0 points  0 points  Count back from 20 0 points 0 points  0 points  Months in reverse 0 points 0 points 0 points 0 points  Repeat phrase 0 points  0 points 0 points  Total Score 0 points   0 points    Immunizations Immunization History  Administered Date(s) Administered   Fluad Quad(high Dose 65+) 08/03/2019, 06/21/2020, 06/14/2021, 09/18/2022   Influenza Split 06/29/2013, 09/21/2014   Influenza, High Dose Seasonal PF 08/22/2016, 08/22/2017, 09/02/2018   Influenza-Unspecified 07/03/2015    Moderna Sars-Covid-2 Vaccination 11/19/2019, 12/21/2019, 10/15/2020, 03/29/2021   Pneumococcal Conjugate-13 04/12/2017   Pneumococcal Polysaccharide-23 12/03/2018   Tdap 04/02/2018   Zoster Recombinant(Shingrix) 09/14/2021, 01/13/2022   Zoster, Live 11/02/2014    TDAP status: Up to date  Flu Vaccine status: Up to date  Pneumococcal vaccine status: Up to date  Covid-19 vaccine status: Completed vaccines  Qualifies for Shingles Vaccine? Yes   Zostavax completed Yes   Shingrix Completed?: Yes  Screening Tests Health Maintenance  Topic Date Due   COVID-19 Vaccine (5 - 2023-24 season) 06/08/2022   INFLUENZA VACCINE  05/09/2023   Medicare Annual Wellness (AWV)  04/14/2024   Colonoscopy  06/06/2025   DTaP/Tdap/Td (2 - Td or Tdap) 04/02/2028   Pneumonia Vaccine 97+ Years old  Completed   Hepatitis C Screening  Completed   Zoster Vaccines- Shingrix  Completed   HPV VACCINES  Aged Out    Health Maintenance  Health Maintenance Due  Topic Date Due   COVID-19 Vaccine (5 - 2023-24 season) 06/08/2022    Colorectal cancer screening: Type of screening: Colonoscopy. Completed 06/06/2020. Repeat every 5 years  Lung Cancer Screening: (Low Dose CT Chest recommended if Age 19-80 years, 20 pack-year currently smoking OR have quit w/in 15years.) does not qualify.   Lung Cancer Screening Referral: n/a  Additional Screening:  Hepatitis C Screening: does not qualify; Completed 02/21/2018  Vision Screening: Recommended annual ophthalmology exams for early detection of glaucoma and other disorders of the eye. Is the patient up to date with their annual eye exam?  Yes  Who is the provider or what is the name of the office in which the patient attends annual eye exams? My eye doctor [facility]  If pt is not established with a provider, would they like to be referred to a provider to establish care? No .   Dental Screening: Recommended annual dental exams for proper oral  hygiene    Community Resource Referral / Chronic Care Management: CRR required this visit?  No   CCM required this visit?  No     Plan:     I have personally reviewed and noted the following in the patient's chart:   Medical and social history Use of alcohol, tobacco or illicit drugs  Current medications and supplements including opioid prescriptions. Patient is not currently taking opioid prescriptions. Functional ability and status Nutritional status Physical activity Advanced directives List of other physicians Hospitalizations, surgeries, and ER visits in previous 12 months Vitals Screenings to include cognitive, depression, and falls Referrals and appointments  In addition, I have reviewed and discussed with patient certain preventive protocols, quality metrics, and best practice recommendations. A written personalized care plan for preventive services as well as general preventive health recommendations were provided to  patient.     Lorrene Reid, LPN   10/13/1094   After Visit Summary: (MyChart) Due to this being a telephonic visit, the after visit summary with patients personalized plan was offered to patient via MyChart   Nurse Notes: none

## 2023-04-17 NOTE — Patient Instructions (Signed)
Charles Nichols , Thank you for taking time to come for your Medicare Wellness Visit. I appreciate your ongoing commitment to your health goals. Please review the following plan we discussed and let me know if I can assist you in the future.   These are the goals we discussed:  Goals       Follow up with Primary Care Provider (pt-stated)      As needed       Increase activity (pt-stated)      Stay active, as tolerated        This is a list of the screening recommended for you and due dates:  Health Maintenance  Topic Date Due   COVID-19 Vaccine (5 - 2023-24 season) 06/08/2022   Flu Shot  05/09/2023   Medicare Annual Wellness Visit  04/14/2024   Colon Cancer Screening  06/06/2025   DTaP/Tdap/Td vaccine (2 - Td or Tdap) 04/02/2028   Pneumonia Vaccine  Completed   Hepatitis C Screening  Completed   Zoster (Shingles) Vaccine  Completed   HPV Vaccine  Aged Out    Advanced directives: Advance directive discussed with you today. I have provided a copy for you to complete at home and have notarized. Once this is complete please bring a copy in to our office so we can scan it into your chart. Information on Advanced Care Planning can be found at [facility] Advance Health Care Directives Advance Health Care Directives ([email or web address]    Conditions/risks identified: Aim for 30 minutes of exercise or brisk walking, 6-8 glasses of water, and 5 servings of fruits and vegetables each day.   Next appointment: Follow up in one year for your annual wellness visit.   Preventive Care 60 Years and Older, Male  Preventive care refers to lifestyle choices and visits with your health care provider that can promote health and wellness. What does preventive care include? A yearly physical exam. This is also called an annual well check. Dental exams once or twice a year. Routine eye exams. Ask your health care provider how often you should have your eyes checked. Personal lifestyle  choices, including: Daily care of your teeth and gums. Regular physical activity. Eating a healthy diet. Avoiding tobacco and drug use. Limiting alcohol use. Practicing safe sex. Taking low doses of aspirin every day. Taking vitamin and mineral supplements as recommended by your health care provider. What happens during an annual well check? The services and screenings done by your health care provider during your annual well check will depend on your age, overall health, lifestyle risk factors, and family history of disease. Counseling  Your health care provider may ask you questions about your: Alcohol use. Tobacco use. Drug use. Emotional well-being. Home and relationship well-being. Sexual activity. Eating habits. History of falls. Memory and ability to understand (cognition). Work and work Astronomer. Screening  You may have the following tests or measurements: Height, weight, and BMI. Blood pressure. Lipid and cholesterol levels. These may be checked every 5 years, or more frequently if you are over 65 years old. Skin check. Lung cancer screening. You may have this screening every year starting at age 88 if you have a 30-pack-year history of smoking and currently smoke or have quit within the past 15 years. Fecal occult blood test (FOBT) of the stool. You may have this test every year starting at age 69. Flexible sigmoidoscopy or colonoscopy. You may have a sigmoidoscopy every 5 years or a colonoscopy every 10 years starting  at age 69. Prostate cancer screening. Recommendations will vary depending on your family history and other risks. Hepatitis C blood test. Hepatitis B blood test. Sexually transmitted disease (STD) testing. Diabetes screening. This is done by checking your blood sugar (glucose) after you have not eaten for a while (fasting). You may have this done every 1-3 years. Abdominal aortic aneurysm (AAA) screening. You may need this if you are a current or  former smoker. Osteoporosis. You may be screened starting at age 69 if you are at high risk. Talk with your health care provider about your test results, treatment options, and if necessary, the need for more tests. Vaccines  Your health care provider may recommend certain vaccines, such as: Influenza vaccine. This is recommended every year. Tetanus, diphtheria, and acellular pertussis (Tdap, Td) vaccine. You may need a Td booster every 10 years. Zoster vaccine. You may need this after age 54. Pneumococcal 13-valent conjugate (PCV13) vaccine. One dose is recommended after age 67. Pneumococcal polysaccharide (PPSV23) vaccine. One dose is recommended after age 46. Talk to your health care provider about which screenings and vaccines you need and how often you need them. This information is not intended to replace advice given to you by your health care provider. Make sure you discuss any questions you have with your health care provider. Document Released: 10/21/2015 Document Revised: 06/13/2016 Document Reviewed: 07/26/2015 Elsevier Interactive Patient Education  2017 [employer].  Fall Prevention in the Home Falls can cause injuries. They can happen to people of all ages. There are many things you can do to make your home safe and to help prevent falls. What can I do on the outside of my home? Regularly fix the edges of walkways and driveways and fix any cracks. Remove anything that might make you trip as you walk through a door, such as a raised step or threshold. Trim any bushes or trees on the path to your home. Use bright outdoor lighting. Clear any walking paths of anything that might make someone trip, such as rocks or tools. Regularly check to see if handrails are loose or broken. Make sure that both sides of any steps have handrails. Any raised decks and porches should have guardrails on the edges. Have any leaves, snow, or ice cleared regularly. Use sand or salt on walking paths  during winter. Clean up any spills in your garage right away. This includes oil or grease spills. What can I do in the bathroom? Use night lights. Install grab bars by the toilet and in the tub and shower. Do not use towel bars as grab bars. Use non-skid mats or decals in the tub or shower. If you need to sit down in the shower, use a plastic, non-slip stool. Keep the floor dry. Clean up any water that spills on the floor as soon as it happens. Remove soap buildup in the tub or shower regularly. Attach bath mats securely with double-sided non-slip rug tape. Do not have throw rugs and other things on the floor that can make you trip. What can I do in the bedroom? Use night lights. Make sure that you have a light by your bed that is easy to reach. Do not use any sheets or blankets that are too big for your bed. They should not hang down onto the floor. Have a firm chair that has side arms. You can use this for support while you get dressed. Do not have throw rugs and other things on the floor that can  make you trip. What can I do in the kitchen? Clean up any spills right away. Avoid walking on wet floors. Keep items that you use a lot in easy-to-reach places. If you need to reach something above you, use a strong step stool that has a grab bar. Keep electrical cords out of the way. Do not use floor polish or wax that makes floors slippery. If you must use wax, use non-skid floor wax. Do not have throw rugs and other things on the floor that can make you trip. What can I do with my stairs? Do not leave any items on the stairs. Make sure that there are handrails on both sides of the stairs and use them. Fix handrails that are broken or loose. Make sure that handrails are as long as the stairways. Check any carpeting to make sure that it is firmly attached to the stairs. Fix any carpet that is loose or worn. Avoid having throw rugs at the top or bottom of the stairs. If you do have throw  rugs, attach them to the floor with carpet tape. Make sure that you have a light switch at the top of the stairs and the bottom of the stairs. If you do not have them, ask someone to add them for you. What else can I do to help prevent falls? Wear shoes that: Do not have high heels. Have rubber bottoms. Are comfortable and fit you well. Are closed at the toe. Do not wear sandals. If you use a stepladder: Make sure that it is fully opened. Do not climb a closed stepladder. Make sure that both sides of the stepladder are locked into place. Ask someone to hold it for you, if possible. Clearly mark and make sure that you can see: Any grab bars or handrails. First and last steps. Where the edge of each step is. Use tools that help you move around (mobility aids) if they are needed. These include: Canes. Walkers. Scooters. Crutches. Turn on the lights when you go into a dark area. Replace any light bulbs as soon as they burn out. Set up your furniture so you have a clear path. Avoid moving your furniture around. If any of your floors are uneven, fix them. If there are any pets around you, be aware of where they are. Review your medicines with your doctor. Some medicines can make you feel dizzy. This can increase your chance of falling. Ask your doctor what other things that you can do to help prevent falls. This information is not intended to replace advice given to you by your health care provider. Make sure you discuss any questions you have with your health care provider. Document Released: 07/21/2009 Document Revised: 03/01/2016 Document Reviewed: 10/29/2014 Elsevier Interactive Patient Education  2017 [employer].

## 2023-04-23 DIAGNOSIS — L663 Perifolliculitis capitis abscedens: Secondary | ICD-10-CM | POA: Diagnosis not present

## 2023-04-25 ENCOUNTER — Ambulatory Visit: Payer: Medicare Other | Admitting: Internal Medicine

## 2023-05-06 ENCOUNTER — Telehealth: Payer: Self-pay

## 2023-05-06 NOTE — Telephone Encounter (Signed)
Pt called in stating that he needs clearance from Dr Lorin Picket so that he can keep his drivers license. Was sent to Dr Malvin Johns to be evaluated for seizures. Dr Malvin Johns put note in 01/2023 note and stated that "[facility] medical consent form signed" I have not received anything about this. Patient stated you sent something to [facility] last year but it was not a specific form that had to be filled out. Do you know anything about this?

## 2023-05-06 NOTE — Telephone Encounter (Signed)
See phone messages 05/2022 - DMV number with extension - if there is a form I am supposed to complete.  Also can see if Gavin Potters has copy of form Dr Malvin Johns completed.

## 2023-05-07 ENCOUNTER — Telehealth: Payer: Self-pay

## 2023-05-07 NOTE — Telephone Encounter (Signed)
Called and spoke with [facility] at Dr Geralyn Flash office. Requested Medical Consent form from Dr Malvin Johns. Called patient to update him on status. Will wait to obtain forms from Dr Malvin Johns.

## 2023-05-07 NOTE — Telephone Encounter (Signed)
Error

## 2023-05-07 NOTE — Telephone Encounter (Signed)
Spoke with Pam at [facility] and was advised that no medical records have been received. Also was told that letter was mailed to patient 04/08/23. Printed DMV packet. Will call Dr Geralyn Flash office to obtain forms they completed.

## 2023-05-07 NOTE — Telephone Encounter (Signed)
Form partially completed.  See me about this.

## 2023-05-07 NOTE — Telephone Encounter (Signed)
Charles Nichols- Spoke with [facility] and was advised that they cannot send [place] the forms that Dr Malvin Johns completed for the [facility] without patient coming to their office to sign a medical release and pick up his own records to bring to our office. Asked if I could send med rec request and was told that it would have to be their signed release. She did say that Dr Malvin Johns completed page 7 of the [facility] packet and would resend her part.

## 2023-05-07 NOTE — Telephone Encounter (Signed)
Patient called and would like to speak to Charles Nichols. This is the last day to get his info together to try to get his license back.

## 2023-05-08 NOTE — Telephone Encounter (Signed)
Per chart, patient scheduled.

## 2023-05-08 NOTE — Telephone Encounter (Signed)
LMTCB. When he returns call- Please see if patient is agreeable to do a visit with Dr Lorin Picket tomorrow at 2:30 to discuss his forms. Can be virtual or in person- which ever he prefers.

## 2023-05-09 ENCOUNTER — Encounter: Payer: Self-pay | Admitting: Internal Medicine

## 2023-05-09 ENCOUNTER — Ambulatory Visit (INDEPENDENT_AMBULATORY_CARE_PROVIDER_SITE_OTHER): Payer: Medicare Other | Admitting: Internal Medicine

## 2023-05-09 VITALS — BP 128/72 | HR 111 | Temp 98.4°F | Ht 75.0 in | Wt 269.4 lb

## 2023-05-09 DIAGNOSIS — I10 Essential (primary) hypertension: Secondary | ICD-10-CM

## 2023-05-09 DIAGNOSIS — R4182 Altered mental status, unspecified: Secondary | ICD-10-CM

## 2023-05-09 DIAGNOSIS — R7989 Other specified abnormal findings of blood chemistry: Secondary | ICD-10-CM

## 2023-05-09 DIAGNOSIS — M1A09X0 Idiopathic chronic gout, multiple sites, without tophus (tophi): Secondary | ICD-10-CM

## 2023-05-09 DIAGNOSIS — E7800 Pure hypercholesterolemia, unspecified: Secondary | ICD-10-CM | POA: Diagnosis not present

## 2023-05-09 DIAGNOSIS — R739 Hyperglycemia, unspecified: Secondary | ICD-10-CM | POA: Diagnosis not present

## 2023-05-09 DIAGNOSIS — D649 Anemia, unspecified: Secondary | ICD-10-CM

## 2023-05-09 DIAGNOSIS — I739 Peripheral vascular disease, unspecified: Secondary | ICD-10-CM

## 2023-05-09 DIAGNOSIS — I25118 Atherosclerotic heart disease of native coronary artery with other forms of angina pectoris: Secondary | ICD-10-CM

## 2023-05-09 DIAGNOSIS — I70213 Atherosclerosis of native arteries of extremities with intermittent claudication, bilateral legs: Secondary | ICD-10-CM

## 2023-05-09 DIAGNOSIS — Y908 Blood alcohol level of 240 mg/100 ml or more: Secondary | ICD-10-CM

## 2023-05-09 NOTE — Progress Notes (Signed)
Subjective:    Patient ID: Charles Nichols, male    DOB: 09-Apr-1951, 72 y.o.   MRN: [id number]  Patient here for  Chief Complaint  Patient presents with   Discuss paperwork    HPI Here for work in appt. Work in to discuss [employer] paperwork. 04/29/22 - had ER visit - after being found by police officers with car against a rail. W/up in ER included CTA head and neck - no emergent large vessel occlusion or hemodynamically significant stenosis of the head or neck. Normal head CT.  Alcohol level elevated.  Saw cardiology - w/up unrevealing.  ECHO - EF 70-75% with mild LVH and trivial MR.  Monitor - no significant arrhythmia.  No further cardiac w/up recommended.  Saw neurology.  Neurology completed DMV papers - for permission to drive.  On questioning him, he also saw a substance abuse counselor.  States he received notification that he could return to driving - in 02/6432.  Has not had any further episodes.  Stays active.  No chest pain or sob.  No syncope or near syncope.  No headache, dizziness or light headedness.  Not drinking.  States has not had any alcohol since the 04/2022 episode.  Has adjusted diet.  Losing weight.  States wants to take care of himself.     Past Medical History:  Diagnosis Date   Arthritis    Elevated ferritin    Negative work-up for hemochromatosis   History of chicken pox    Hypertension    Obesity    Peripheral vascular disease (HCC)    Past Surgical History:  Procedure Laterality Date   CIRCUMCISION     COLONOSCOPY WITH PROPOFOL N/A 04/08/2015   Procedure: COLONOSCOPY WITH PROPOFOL;  Surgeon: Christena Deem, MD;  Location[facility] ENDOSCOPY;  Service: Endoscopy;  Laterality: N/A;   ESOPHAGOGASTRODUODENOSCOPY N/A 04/08/2015   Procedure: ESOPHAGOGASTRODUODENOSCOPY (EGD);  Surgeon: Christena Deem, MD;  Location: [facility] ENDOSCOPY;  Service: Endoscopy;  Laterality: N/A;   FEMORAL-POPLITEAL BYPASS GRAFT Bilateral 1997, 2000   leg stent     pvd   Family History  Problem  Relation Age of Onset   Breast cancer Sister    Hypertension Sister    Hypertension Brother    Breast cancer Daughter    Social History   Socioeconomic History   Marital status: Legally Separated    Spouse name: Not on file   Number of children: Not on file   Years of education: Not on file   Highest education level: Not on file  Occupational History   Not on file  Tobacco Use   Smoking status: Former    Current packs/day: 0.00    Types: Cigarettes    Quit date: 1997    Years since quitting: 27.6    Passive exposure: Never   Smokeless tobacco: Never  Vaping Use   Vaping status: Never Used  Substance and Sexual Activity   Alcohol use: Not Currently   Drug use: No   Sexual activity: Not on file  Other Topics Concern   Not on file  Social History Narrative   Not on file   Social Determinants of Health   Financial Resource Strain: Low Risk  (04/15/2023)   Overall Financial Resource Strain (CARDIA)    Difficulty of Paying Living Expenses: Not hard at all  Food Insecurity: No Food Insecurity (04/15/2023)   Hunger Vital Sign    Worried About Running Out of Food in the Last Year: Never true  Ran Out of Food in the Last Year: Never true  Transportation Needs: No Transportation Needs (04/15/2023)   PRAPARE - Administrator, Civil Service (Medical): No    Lack of Transportation (Non-Medical): No  Physical Activity: Insufficiently Active (04/15/2023)   Exercise Vital Sign    Days of Exercise per Week: 2 days    Minutes of Exercise per Session: 30 min  Stress: No Stress Concern Present (04/15/2023)   [employer] of Occupational Health - Occupational Stress Questionnaire    Feeling of Stress : Not at all  Social Connections: Moderately Isolated (04/15/2023)   Social Connection and Isolation Panel [NHANES]    Frequency of Communication with Friends and Family: More than three times a week    Frequency of Social Gatherings with Friends and Family: More than three  times a week    Attends Religious Services: More than 4 times per year    Active Member of [employer] or Organizations: No    Attends Banker Meetings: Never    Marital Status: Separated     Review of Systems  Constitutional:  Negative for appetite change and unexpected weight change.  HENT:  Negative for congestion and sinus pressure.   Respiratory:  Negative for cough, chest tightness and shortness of breath.   Cardiovascular:  Negative for chest pain, palpitations and leg swelling.  Gastrointestinal:  Negative for abdominal pain, diarrhea, nausea and vomiting.  Genitourinary:  Negative for difficulty urinating and dysuria.  Musculoskeletal:  Negative for myalgias.       Joints better.   Skin:  Negative for color change and rash.  Neurological:  Negative for dizziness and headaches.  Psychiatric/Behavioral:  Negative for agitation and dysphoric mood.        Objective:     BP 128/72   Pulse (!) 111   Temp 98.4 F (36.9 C) (Oral)   Ht 6\' 3"  (1.905 m)   Wt 269 lb 6.4 oz (122.2 kg)   SpO2 97%   BMI 33.67 kg/m  Wt Readings from Last 3 Encounters:  05/09/23 269 lb 6.4 oz (122.2 kg)  04/15/23 271 lb (122.9 kg)  03/01/23 280 lb (127 kg)    Physical Exam Vitals reviewed.  Constitutional:      General: He is not in acute distress.    Appearance: Normal appearance. He is well-developed.  HENT:     Head: Normocephalic and atraumatic.     Right Ear: External ear normal.     Left Ear: External ear normal.  Eyes:     General: No scleral icterus.       Right eye: No discharge.        Left eye: No discharge.     Conjunctiva/sclera: Conjunctivae normal.  Cardiovascular:     Rate and Rhythm: Normal rate and regular rhythm.  Pulmonary:     Effort: Pulmonary effort is normal. No respiratory distress.     Breath sounds: Normal breath sounds.  Abdominal:     General: Bowel sounds are normal.     Palpations: Abdomen is soft.     Tenderness: There is no abdominal  tenderness.  Musculoskeletal:        General: No swelling or tenderness.     Cervical back: Neck supple. No tenderness.  Lymphadenopathy:     Cervical: No cervical adenopathy.  Skin:    Findings: No erythema or rash.  Neurological:     Mental Status: He is alert.  Psychiatric:  Mood and Affect: Mood normal.        Behavior: Behavior normal.      Outpatient Encounter Medications as of 05/09/2023  Medication Sig   acetaminophen (TYLENOL) 650 MG CR tablet Take 650 mg by mouth every 8 (eight) hours as needed for pain.   allopurinol (ZYLOPRIM) 300 MG tablet Take 1 tablet (300 mg total) by mouth daily.   aspirin 81 MG tablet Take 81 mg by mouth daily.   chlorthalidone (HYGROTON) 25 MG tablet Take 1 tablet (25 mg total) by mouth daily.   Cholecalciferol (VITAMIN D3) 2000 UNITS TABS Take 1 tablet by mouth daily.   clobetasol cream (TEMOVATE) 0.05 % Apply 1 Application topically 2 (two) times daily.   felodipine (PLENDIL) 10 MG 24 hr tablet Take 1 tablet (10 mg total) by mouth daily.   lisinopril (ZESTRIL) 20 MG tablet Take 2 tablets (40 mg total) by mouth daily.   lovastatin (MEVACOR) 40 MG tablet Take 1 tablet (40 mg total) by mouth daily.   naproxen (NAPROSYN) 500 MG tablet Take 500 mg by mouth 2 (two) times daily.   triamcinolone cream (KENALOG) 0.1 % APPLY TO ITCHY AREAS ON ARMS AND SCALP DAILY   [DISCONTINUED] acetaminophen (TYLENOL) 325 MG tablet Take 650 mg by mouth every 6 (six) hours as needed for pain. (Patient not taking: Reported on 05/09/2023)   No facility-administered encounter medications on file as of 05/09/2023.     Lab Results  Component Value Date   WBC 8.0 05/09/2023   HGB 12.8 (L) 05/09/2023   HCT 39.2 05/09/2023   PLT 236.0 05/09/2023   GLUCOSE 91 05/09/2023   CHOL 114 05/09/2023   TRIG 101.0 05/09/2023   HDL 39.30 05/09/2023   LDLDIRECT 78.0 06/14/2021   LDLCALC 54 05/09/2023   ALT 12 05/09/2023   AST 17 05/09/2023   NA 134 (L) 05/09/2023   K 4.0  05/09/2023   CL 100 05/09/2023   CREATININE 1.17 05/09/2023   BUN 20 05/09/2023   CO2 25 05/09/2023   TSH 3.47 05/09/2023   PSA 1.92 12/20/2022   HGBA1C 5.8 05/09/2023    US Abdomen Limited RUQ (LIVER/GB)  Result Date: 03/01/2022 CLINICAL DATA:  Abnormal LFTs.  Elevated ferritin. EXAM: ULTRASOUND ABDOMEN LIMITED RIGHT UPPER QUADRANT COMPARISON:  None Available. FINDINGS: Gallbladder: Gallbladder is mildly contracted with wall thickness measuring 5 mm. Negative sonographic Murphy's sign. No cholelithiasis. Common bile duct: Diameter: 3 mm Liver: Increased echogenicity. No focal lesion. Portal vein is patent on color Doppler imaging with normal direction of blood flow towards the liver. Other: None. IMPRESSION: Increased hepatic parenchymal echogenicity suggestive of steatosis. Mildly contracted gallbladder without cholelithiasis. Electronically Signed   By: Annia Belt M.D.   On: 03/01/2022 13:32       Assessment & Plan:  Hypercholesterolemia Assessment & Plan: On lovastatin.  Low cholesterol diet and exercise.  Follow lipid panel and liver function tests.    Orders: -     Hepatic function panel -     TSH -     Lipid panel  Hyperglycemia Assessment & Plan: Low carb and exercise.  Follow met b and a1c.    Orders: -     Hemoglobin A1c  Anemia, unspecified type Assessment & Plan: Saw hematology 08/2022 - No clear etiology of anemia at this time.  May be anemia of chronic disease.   Orders: -     CBC with Differential/Platelet  Benign essential hypertension Assessment & Plan: Blood pressure on outside checks have been  under reasonable control as outlined.  Continue lisinopril.  Follow metabolic panel.   Orders: -     Basic metabolic panel  Altered mental status, unspecified altered mental status type Assessment & Plan: 04/29/22 - had ER visit - after being found by police officers with car against a rail. W/up in ER included CTA head and neck - no emergent large vessel  occlusion or hemodynamically significant stenosis of the head or neck. Normal head CT.  Alcohol level elevated.  Saw cardiology - w/up unrevealing.  ECHO - EF 70-75% with mild LVH and trivial MR.  Monitor - no significant arrhythmia.  No further cardiac w/up recommended.  Saw neurology.  Neurology completed DMV papers - for permission to drive.  On questioning him, he also saw a substance abuse counselor.  States he received notification that he could return to driving - in 11/5364.  Has not had any further episodes.  Stays active.  No chest pain or sob.  No syncope or near syncope.  No headache, dizziness or light headedness.  Not drinking.  States has not had any alcohol since the 04/2022 episode.    Atherosclerosis of native artery of both lower extremities with intermittent claudication (HCC) Assessment & Plan: Has been followed by [facility].  Has been stable.  Continue statin.    Coronary artery disease of native artery of native heart with stable angina pectoris [facility]) Assessment & Plan: Saw cardiology.  Echo and zio monitor as outlined previously.  Continue risk factor modification.  Follow.    Elevated ferritin Assessment & Plan: Has seen hematology.  W/up as outlined previously.  Decreased liver rich foods.  No iron supplements.  Follow cbc and iron studies.  Felt to be acute phase reaction.    Peripheral vascular disease (HCC) Assessment & Plan: Has seen [facility].  Continue risk factor modification.     Idiopathic chronic gout of multiple sites without tophus Assessment & Plan: Followed by rheumatology.  Doing well on allopurinol.     Blood alcohol level of 240 mg/100 ml or more Assessment & Plan: Elevated level in ER.  On questioning him, he also saw a substance abuse counselor.  Not drinking.  States has not had any alcohol since the 04/2022 episode.      Dale Pringle, MD

## 2023-05-10 NOTE — Telephone Encounter (Signed)
Patient called with info for the [facility]. Dr Arline Asp needs to call Pierre Bali, counseling service, [phone].

## 2023-05-10 NOTE — Telephone Encounter (Signed)
Records requested from [employer] and Counseling. Patient is aware.

## 2023-05-11 ENCOUNTER — Encounter: Payer: Self-pay | Admitting: Internal Medicine

## 2023-05-11 NOTE — Assessment & Plan Note (Signed)
On lovastatin.  Low cholesterol diet and exercise.  Follow lipid panel and liver function tests.   

## 2023-05-11 NOTE — Assessment & Plan Note (Signed)
Saw hematology 08/2022 - No clear etiology of anemia at this time.  May be anemia of chronic disease.

## 2023-05-11 NOTE — Assessment & Plan Note (Signed)
Saw cardiology.  Echo and zio monitor as outlined previously.  Continue risk factor modification.  Follow.

## 2023-05-11 NOTE — Assessment & Plan Note (Signed)
Low carb and exercise.  Follow met b and a1c.   °

## 2023-05-11 NOTE — Assessment & Plan Note (Signed)
Has been followed by [facility].  Has been stable.  Continue statin.  
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
## 2023-05-11 NOTE — Assessment & Plan Note (Signed)
Blood pressure on outside checks have been under reasonable control as outlined.  Continue lisinopril.  Follow metabolic panel.

## 2023-05-11 NOTE — Assessment & Plan Note (Signed)
Has seen hematology.  W/up as outlined previously.  Decreased liver rich foods.  No iron supplements.  Follow cbc and iron studies.  Felt to be acute phase reaction.

## 2023-05-11 NOTE — Assessment & Plan Note (Signed)
Followed by rheumatology.  Doing well on allopurinol.

## 2023-05-11 NOTE — Assessment & Plan Note (Signed)
Elevated level in ER.  On questioning him, he also saw a substance abuse counselor.  Not drinking.  States has not had any alcohol since the 04/2022 episode.

## 2023-05-11 NOTE — Assessment & Plan Note (Signed)
Has seen [facility].  Continue risk factor modification.   

## 2023-05-13 ENCOUNTER — Telehealth: Payer: Self-pay

## 2023-05-13 NOTE — Telephone Encounter (Signed)
-----   Message from [place] sent at 05/11/2023  3:49 PM EDT ----- Notify - hgb is stable from last check.  Improved from checks prior.  Sodium slightly decreased.  We will follow.  Cholesterol levels look good.  Overall sugar control improved.  Thyroid test and liver function tests are wnl. He had informed me he would call back with substance abuse counselor - name.  Need name so can get records.

## 2023-05-13 NOTE — Telephone Encounter (Signed)
Pt has been notified, pt stated he didn't have any questions or concerns at this time
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
## 2023-05-27 ENCOUNTER — Telehealth: Payer: Self-pay | Admitting: Internal Medicine

## 2023-05-27 NOTE — Telephone Encounter (Signed)
Patient called to check on his medical papers to see if they were ready for pick up yet. He would like for someone to give him a call. His number is [phone].

## 2023-05-30 IMAGING — US US ABDOMEN LIMITED
1 series · 14 of 25 positions shown · non-contrast
Comparison: None Available.

CLINICAL DATA: Abnormal LFTs.  Elevated ferritin.

EXAM:
ULTRASOUND ABDOMEN LIMITED RIGHT UPPER QUADRANT

[Series 1: us abdomen limited ruq (liver/gb) · 14 of 42 slices shown]
[im 1/42]
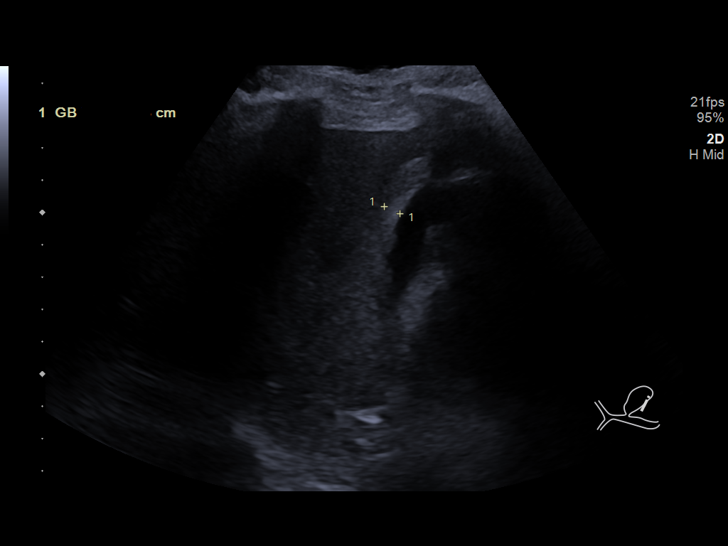
[im 4/42]
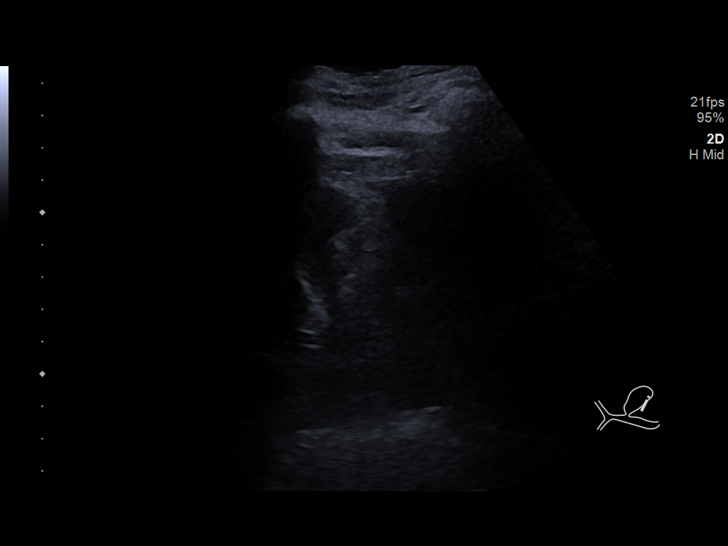
[im 7/42]
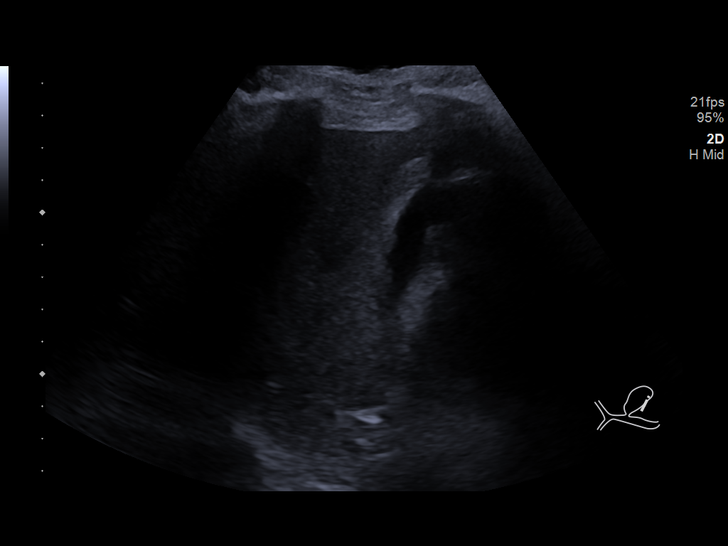
[im 11/42]
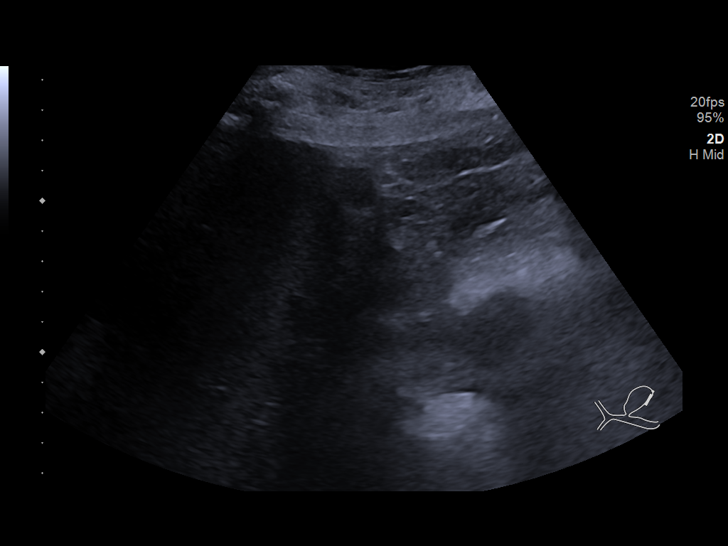
[im 14/42]
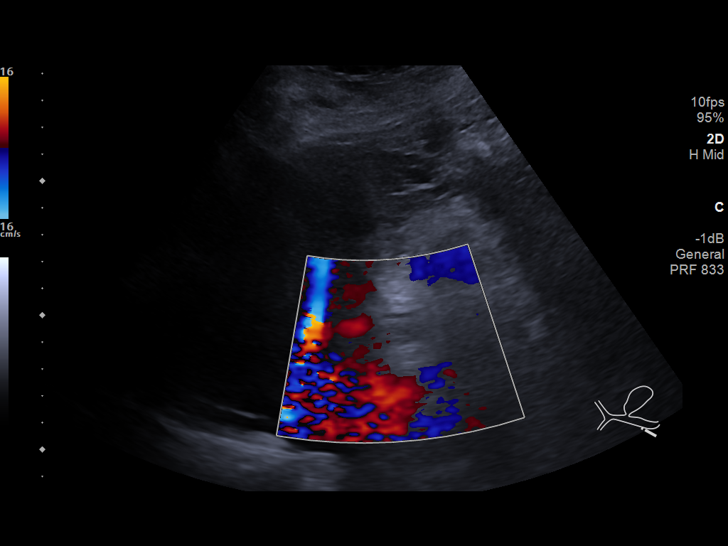
[im 16/42]
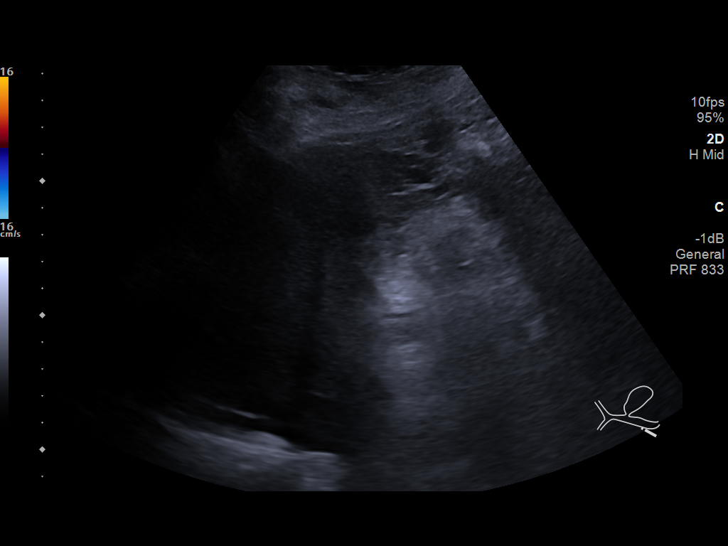
[im 19/42]
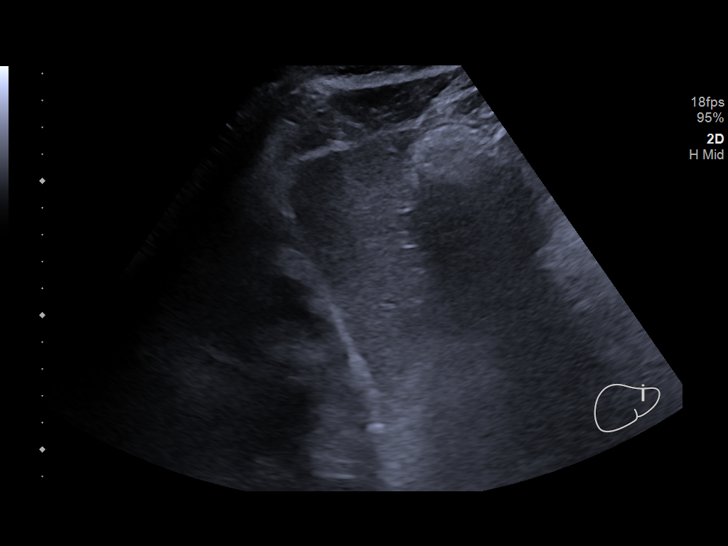
[im 23/42]
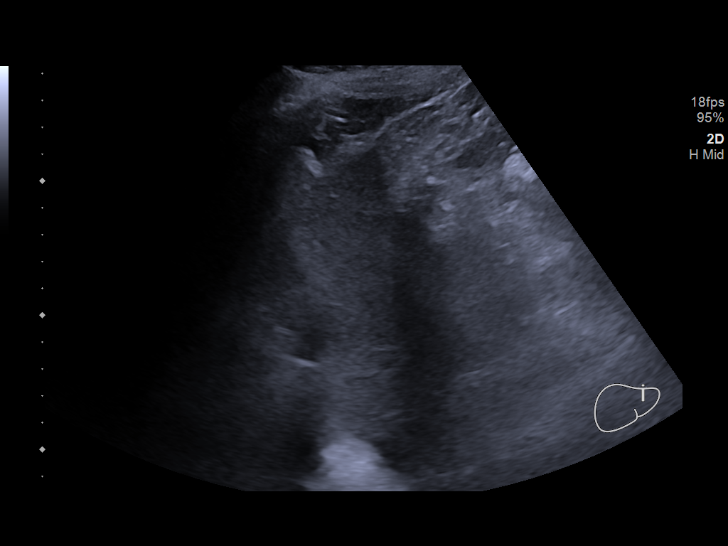
[im 26/42]
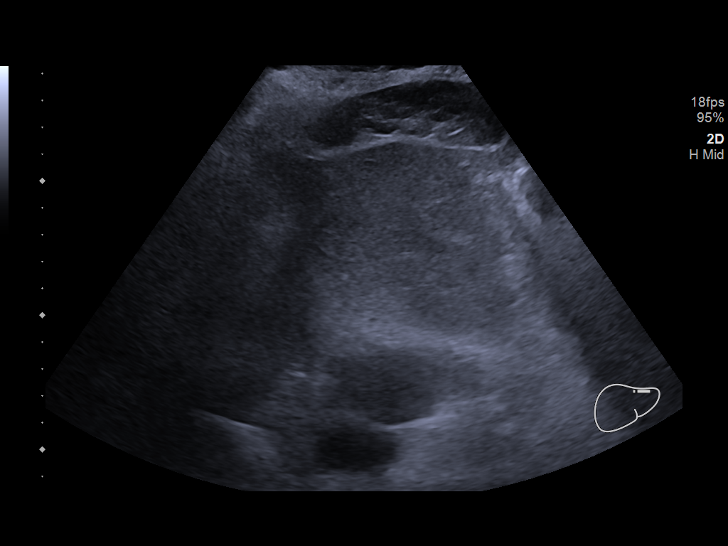
[im 28/42]
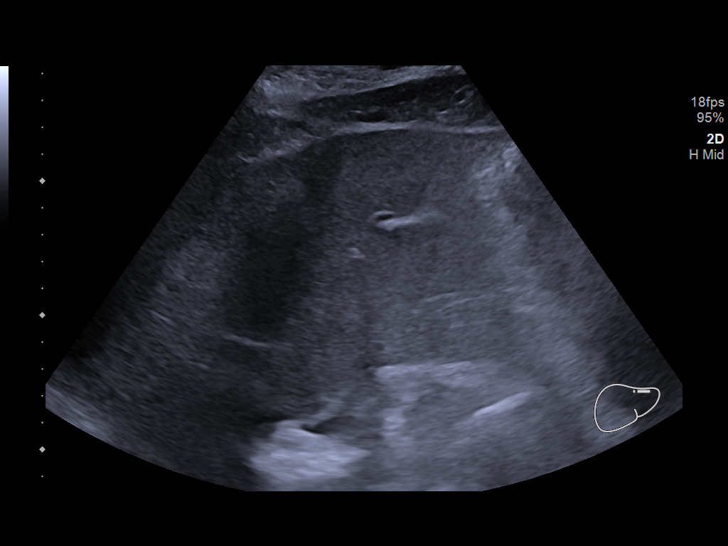
[im 31/42]
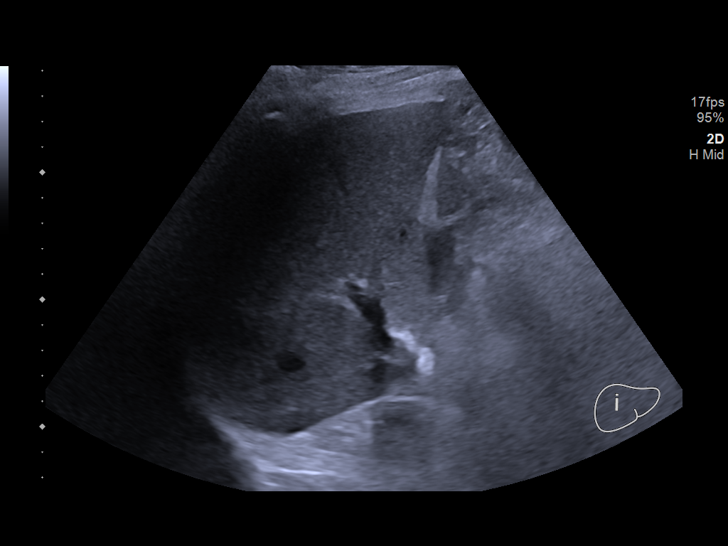
[im 35/42]
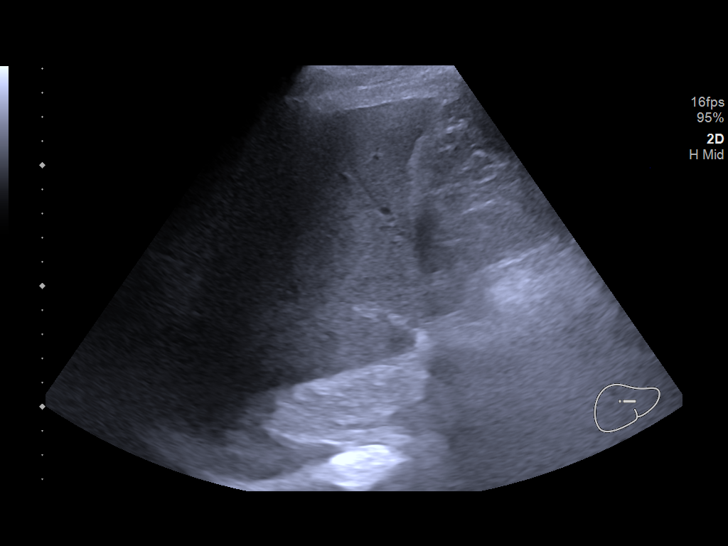
[im 38/42]
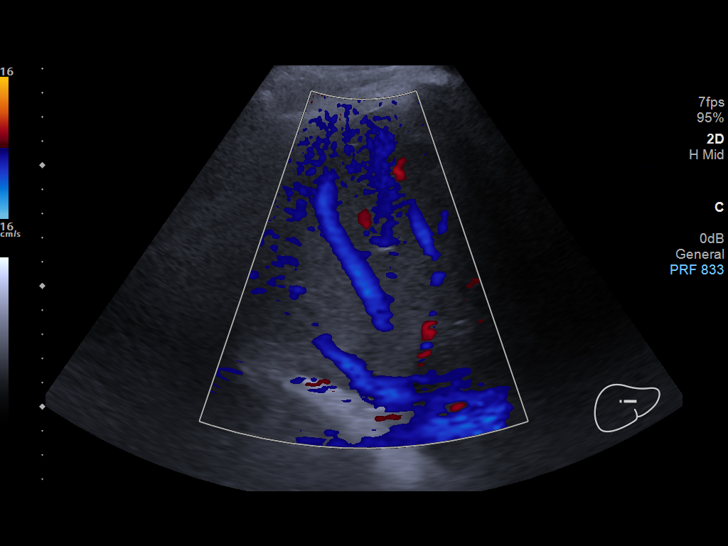
[im 42/42]
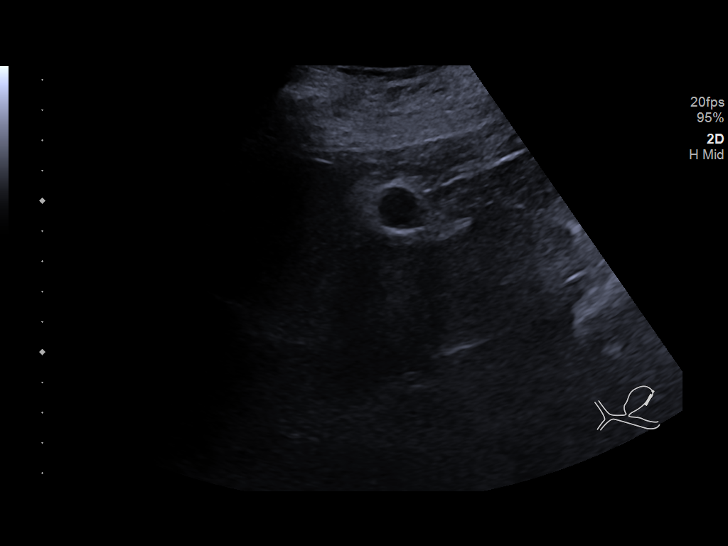

[14 of 25 positions shown; findings below may reference images not displayed]

FINDINGS: Gallbladder:

Gallbladder is mildly contracted with wall thickness measuring 5 mm.
Negative sonographic Murphy's sign. No cholelithiasis.

Common bile duct:

Diameter: 3 mm

Liver:

Increased echogenicity. No focal lesion. Portal vein is patent on
color Doppler imaging with normal direction of blood flow towards
the liver.

Other: None.
IMPRESSION: Increased hepatic parenchymal echogenicity suggestive of steatosis.

Mildly contracted gallbladder without cholelithiasis.

## 2023-05-30 NOTE — Telephone Encounter (Signed)
Forms in basket on [facility] desk & there is a note on the paperwork that says "Waiting for records"   PT states he dropped this off the first of the month & haven't heard anything. He says they already has the notes from [facility] in [place] and has been verified & that he told you that already.  Pt just wants some answers because he doesn't want to lose his license again.

## 2023-05-31 ENCOUNTER — Encounter: Payer: Self-pay | Admitting: *Deleted

## 2023-05-31 NOTE — Telephone Encounter (Signed)
Records request faxed electronically

## 2023-05-31 NOTE — Telephone Encounter (Signed)
Is there anyway that we can get a copy of those notes.  There are different sections on the from to be completed.  Thanks

## 2023-06-03 ENCOUNTER — Ambulatory Visit: Payer: Medicare Other | Attending: Internal Medicine | Admitting: Internal Medicine

## 2023-06-03 ENCOUNTER — Encounter: Payer: Self-pay | Admitting: Internal Medicine

## 2023-06-03 VITALS — BP 130/78 | HR 85 | Resp 14 | Ht 75.0 in | Wt 274.0 lb

## 2023-06-03 DIAGNOSIS — M25512 Pain in left shoulder: Secondary | ICD-10-CM | POA: Diagnosis not present

## 2023-06-03 DIAGNOSIS — Z5181 Encounter for therapeutic drug level monitoring: Secondary | ICD-10-CM | POA: Diagnosis not present

## 2023-06-03 DIAGNOSIS — M25511 Pain in right shoulder: Secondary | ICD-10-CM | POA: Diagnosis not present

## 2023-06-03 DIAGNOSIS — M1A09X0 Idiopathic chronic gout, multiple sites, without tophus (tophi): Secondary | ICD-10-CM

## 2023-06-03 MED ORDER — ALLOPURINOL 300 MG PO TABS
300.0000 mg | ORAL_TABLET | Freq: Every day | ORAL | 1 refills | Status: DC
Start: 2023-06-03 — End: 2023-11-27

## 2023-06-04 LAB — URIC ACID: Uric Acid, Serum: 6.2 mg/dL (ref 4.0–8.0)

## 2023-06-04 LAB — SEDIMENTATION RATE: Sed Rate: 38 mm/h — ABNORMAL HIGH (ref 0–20)

## 2023-06-04 NOTE — Progress Notes (Signed)
Uric acid decreased only slightly to 6.2 from 6.3 still not entirely below the goal of 6.  Sedimentation rate remains slightly above normal at 38. I think he is okay to continue on the allopurinol 300 mg daily for now we will just monitor if he still has flareups in the next few months.

## 2023-06-05 ENCOUNTER — Telehealth: Payer: Self-pay | Admitting: *Deleted

## 2023-06-05 NOTE — Telephone Encounter (Signed)
Patient contacted the office stating he was recently in the office for an appointment. Patient states he was given a paper with the natural anti-inflammatories. Patient states with the Omega 3 it advises EPA and DHA 30 %. He states the omega 3 he currently has states EPA 323 and DHA 155. He would like to no if this is enough or if he needs more or less. Please advise.

## 2023-06-05 NOTE — Telephone Encounter (Signed)
I have not received the records from [place]. We have requested x3 attempts. Have you received in Cc'd chart?

## 2023-06-05 NOTE — Telephone Encounter (Signed)
My part of forms completed.

## 2023-06-06 ENCOUNTER — Ambulatory Visit: Payer: Medicare Other | Admitting: Internal Medicine

## 2023-06-06 NOTE — Telephone Encounter (Signed)
Spoke with patient. Forms faxed to [facility] and copy placed up front for patient to pick up. Patient is aware.

## 2023-06-11 DIAGNOSIS — L663 Perifolliculitis capitis abscedens: Secondary | ICD-10-CM | POA: Diagnosis not present

## 2023-06-20 DIAGNOSIS — G5750 Tarsal tunnel syndrome, unspecified lower limb: Secondary | ICD-10-CM | POA: Diagnosis not present

## 2023-06-20 DIAGNOSIS — M79672 Pain in left foot: Secondary | ICD-10-CM | POA: Diagnosis not present

## 2023-06-20 DIAGNOSIS — M79671 Pain in right foot: Secondary | ICD-10-CM | POA: Diagnosis not present

## 2023-07-16 DIAGNOSIS — M75101 Unspecified rotator cuff tear or rupture of right shoulder, not specified as traumatic: Secondary | ICD-10-CM | POA: Diagnosis not present

## 2023-07-16 DIAGNOSIS — M7542 Impingement syndrome of left shoulder: Secondary | ICD-10-CM | POA: Diagnosis not present

## 2023-07-16 DIAGNOSIS — M1712 Unilateral primary osteoarthritis, left knee: Secondary | ICD-10-CM | POA: Diagnosis not present

## 2023-08-14 ENCOUNTER — Ambulatory Visit (INDEPENDENT_AMBULATORY_CARE_PROVIDER_SITE_OTHER): Payer: Medicare Other | Admitting: Internal Medicine

## 2023-08-14 VITALS — BP 112/68 | HR 82 | Temp 97.9°F | Resp 16 | Ht 75.0 in | Wt 269.0 lb

## 2023-08-14 DIAGNOSIS — I10 Essential (primary) hypertension: Secondary | ICD-10-CM | POA: Diagnosis not present

## 2023-08-14 DIAGNOSIS — R739 Hyperglycemia, unspecified: Secondary | ICD-10-CM

## 2023-08-14 DIAGNOSIS — I739 Peripheral vascular disease, unspecified: Secondary | ICD-10-CM

## 2023-08-14 DIAGNOSIS — M25511 Pain in right shoulder: Secondary | ICD-10-CM

## 2023-08-14 DIAGNOSIS — I25118 Atherosclerotic heart disease of native coronary artery with other forms of angina pectoris: Secondary | ICD-10-CM

## 2023-08-14 DIAGNOSIS — M25512 Pain in left shoulder: Secondary | ICD-10-CM

## 2023-08-14 DIAGNOSIS — Z860100 Personal history of colon polyps, unspecified: Secondary | ICD-10-CM

## 2023-08-14 DIAGNOSIS — I70213 Atherosclerosis of native arteries of extremities with intermittent claudication, bilateral legs: Secondary | ICD-10-CM

## 2023-08-14 DIAGNOSIS — R7989 Other specified abnormal findings of blood chemistry: Secondary | ICD-10-CM

## 2023-08-14 DIAGNOSIS — E7800 Pure hypercholesterolemia, unspecified: Secondary | ICD-10-CM | POA: Diagnosis not present

## 2023-08-14 DIAGNOSIS — D649 Anemia, unspecified: Secondary | ICD-10-CM

## 2023-08-14 MED ORDER — CHLORTHALIDONE 25 MG PO TABS: 25.0000 mg | ORAL_TABLET | Freq: Every day | ORAL | 3 refills | Status: DC

## 2023-08-14 MED ORDER — LOVASTATIN 40 MG PO TABS: 40.0000 mg | ORAL_TABLET | Freq: Every day | ORAL | 3 refills | Status: DC

## 2023-08-14 MED ORDER — FELODIPINE ER 10 MG PO TB24: 10.0000 mg | ORAL_TABLET | Freq: Every day | ORAL | 3 refills | Status: DC

## 2023-08-14 MED ORDER — LISINOPRIL 20 MG PO TABS: 40.0000 mg | ORAL_TABLET | Freq: Every day | ORAL | 3 refills | Status: DC

## 2023-08-14 NOTE — Progress Notes (Addendum)
Subjective:    Patient ID: Charles Nichols, male    DOB: 26-Jun-1951, 72 y.o.   MRN: [id number]  Patient here for  Chief Complaint  Patient presents with   Medical Management of Chronic Issues    HPI Here to follow up regarding hypercholesterolemia and hypertension. Saw rheumatology - f/u chronic gout.  On allopurinol. Stable. Saw ortho 07/16/23 - right shoulder pain.  S/p injection.  Still having issues with his shoulder.  Discussed stretches/exercise. Tries to stay active. No chest pain or sob reported.  No cough or congestion.  No abdominal pain or bowel change reported.    Past Medical History:  Diagnosis Date   Arthritis    Elevated ferritin    Negative work-up for hemochromatosis   History of chicken pox    Hypertension    Obesity    Peripheral vascular disease (HCC)    Past Surgical History:  Procedure Laterality Date   CIRCUMCISION     COLONOSCOPY WITH PROPOFOL N/A 04/08/2015   Procedure: COLONOSCOPY WITH PROPOFOL;  Surgeon: Christena Deem, MD;  Location: [facility] ENDOSCOPY;  Service: Endoscopy;  Laterality: N/A;   ESOPHAGOGASTRODUODENOSCOPY N/A 04/08/2015   Procedure: ESOPHAGOGASTRODUODENOSCOPY (EGD);  Surgeon: Christena Deem, MD;  Location: [facility] ENDOSCOPY;  Service: Endoscopy;  Laterality: N/A;   FEMORAL-POPLITEAL BYPASS GRAFT Bilateral 1997, 2000   leg stent     pvd   Family History  Problem Relation Age of Onset   Breast cancer Sister    Hypertension Sister    Hypertension Brother    Breast cancer Daughter    Social History   Socioeconomic History   Marital status: Legally Separated    Spouse name: Not on file   Number of children: Not on file   Years of education: Not on file   Highest education level: Not on file  Occupational History   Not on file  Tobacco Use   Smoking status: Former    Current packs/day: 0.00    Types: Cigarettes    Quit date: 1997    Years since quitting: 27.8    Passive exposure: Never   Smokeless tobacco: Never  Vaping Use    Vaping status: Never Used  Substance and Sexual Activity   Alcohol use: Not Currently   Drug use: No   Sexual activity: Not on file  Other Topics Concern   Not on file  Social History Narrative   Not on file   Social Determinants of Health   Financial Resource Strain: Low Risk  (04/15/2023)   Overall Financial Resource Strain (CARDIA)    Difficulty of Paying Living Expenses: Not hard at all  Food Insecurity: No Food Insecurity (04/15/2023)   Hunger Vital Sign    Worried About Running Out of Food in the Last Year: Never true    Ran Out of Food in the Last Year: Never true  Transportation Needs: No Transportation Needs (04/15/2023)   PRAPARE - Administrator, Civil Service (Medical): No    Lack of Transportation (Non-Medical): No  Physical Activity: Insufficiently Active (04/15/2023)   Exercise Vital Sign    Days of Exercise per Week: 2 days    Minutes of Exercise per Session: 30 min  Stress: No Stress Concern Present (04/15/2023)   [employer] of Occupational Health - Occupational Stress Questionnaire    Feeling of Stress : Not at all  Social Connections: Moderately Isolated (04/15/2023)   Social Connection and Isolation Panel [NHANES]    Frequency of Communication with Friends  and Family: More than three times a week    Frequency of Social Gatherings with Friends and Family: More than three times a week    Attends Religious Services: More than 4 times per year    Active Member of [employer] or Organizations: No    Attends Banker Meetings: Never    Marital Status: Separated     Review of Systems  Constitutional:  Negative for appetite change and unexpected weight change.  HENT:  Negative for congestion and sinus pressure.   Respiratory:  Negative for cough, chest tightness and shortness of breath.   Cardiovascular:  Negative for chest pain and palpitations.  Gastrointestinal:  Negative for abdominal pain, diarrhea, nausea and vomiting.  Genitourinary:   Negative for difficulty urinating and dysuria.  Musculoskeletal:  Negative for joint swelling and myalgias.       Shoulder pain as outlined.   Skin:  Negative for color change and rash.  Neurological:  Negative for dizziness and headaches.  Psychiatric/Behavioral:  Negative for agitation and dysphoric mood.        Objective:     BP 112/68   Pulse 82   Temp 97.9 F (36.6 C)   Resp 16   Ht 6\' 3"  (1.905 m)   Wt 269 lb (122 kg)   SpO2 98%   BMI 33.62 kg/m  Wt Readings from Last 3 Encounters:  08/14/23 269 lb (122 kg)  06/03/23 274 lb (124.3 kg)  05/09/23 269 lb 6.4 oz (122.2 kg)    Physical Exam Constitutional:      General: He is not in acute distress.    Appearance: Normal appearance. He is well-developed.  HENT:     Head: Normocephalic and atraumatic.     Right Ear: External ear normal.     Left Ear: External ear normal.  Eyes:     General: No scleral icterus.       Right eye: No discharge.        Left eye: No discharge.  Cardiovascular:     Rate and Rhythm: Normal rate and regular rhythm.  Pulmonary:     Effort: Pulmonary effort is normal. No respiratory distress.     Breath sounds: Normal breath sounds.  Abdominal:     General: Bowel sounds are normal.     Palpations: Abdomen is soft.     Tenderness: There is no abdominal tenderness.  Musculoskeletal:        General: No swelling or tenderness.     Cervical back: Neck supple. No tenderness.  Lymphadenopathy:     Cervical: No cervical adenopathy.  Skin:    Findings: No erythema or rash.  Neurological:     Mental Status: He is alert.  Psychiatric:        Mood and Affect: Mood normal.        Behavior: Behavior normal.      Outpatient Encounter Medications as of 08/14/2023  Medication Sig   acetaminophen (TYLENOL) 650 MG CR tablet Take 650 mg by mouth every 8 (eight) hours as needed for pain.   allopurinol (ZYLOPRIM) 300 MG tablet Take 1 tablet (300 mg total) by mouth daily.   aspirin 81 MG tablet Take  81 mg by mouth daily.   chlorthalidone (HYGROTON) 25 MG tablet Take 1 tablet (25 mg total) by mouth daily.   Cholecalciferol (VITAMIN D3) 2000 UNITS TABS Take 1 tablet by mouth daily.   clobetasol cream (TEMOVATE) 0.05 % Apply 1 Application topically 2 (two) times daily.  doxycycline (VIBRAMYCIN) 100 MG capsule Take 100 mg by mouth daily.   felodipine (PLENDIL) 10 MG 24 hr tablet Take 1 tablet (10 mg total) by mouth daily.   lisinopril (ZESTRIL) 20 MG tablet Take 2 tablets (40 mg total) by mouth daily.   lovastatin (MEVACOR) 40 MG tablet Take 1 tablet (40 mg total) by mouth daily.   naproxen (NAPROSYN) 500 MG tablet Take 500 mg by mouth 2 (two) times daily.   triamcinolone cream (KENALOG) 0.1 % APPLY TO ITCHY AREAS ON ARMS AND SCALP DAILY   [DISCONTINUED] chlorthalidone (HYGROTON) 25 MG tablet Take 1 tablet (25 mg total) by mouth daily.   [DISCONTINUED] felodipine (PLENDIL) 10 MG 24 hr tablet Take 1 tablet (10 mg total) by mouth daily.   [DISCONTINUED] lisinopril (ZESTRIL) 20 MG tablet Take 2 tablets (40 mg total) by mouth daily.   [DISCONTINUED] lovastatin (MEVACOR) 40 MG tablet Take 1 tablet (40 mg total) by mouth daily.   No facility-administered encounter medications on file as of 08/14/2023.     Lab Results  Component Value Date   WBC 9.5 08/15/2023   HGB 12.5 (L) 08/15/2023   HCT 37.6 (L) 08/15/2023   PLT 225 08/15/2023   GLUCOSE 92 08/15/2023   CHOL 114 05/09/2023   TRIG 101.0 05/09/2023   HDL 39.30 05/09/2023   LDLDIRECT 78.0 06/14/2021   LDLCALC 54 05/09/2023   ALT 18 08/15/2023   AST 22 08/15/2023   NA 136 08/15/2023   K 3.9 08/15/2023   CL 101 08/15/2023   CREATININE 1.15 08/15/2023   BUN 25 (H) 08/15/2023   CO2 24 08/15/2023   TSH 3.47 05/09/2023   PSA 1.92 12/20/2022   HGBA1C 5.8 05/09/2023    US Abdomen Limited RUQ (LIVER/GB)  Result Date: 03/01/2022 CLINICAL DATA:  Abnormal LFTs.  Elevated ferritin. EXAM: ULTRASOUND ABDOMEN LIMITED RIGHT UPPER QUADRANT  COMPARISON:  None Available. FINDINGS: Gallbladder: Gallbladder is mildly contracted with wall thickness measuring 5 mm. Negative sonographic Murphy's sign. No cholelithiasis. Common bile duct: Diameter: 3 mm Liver: Increased echogenicity. No focal lesion. Portal vein is patent on color Doppler imaging with normal direction of blood flow towards the liver. Other: None. IMPRESSION: Increased hepatic parenchymal echogenicity suggestive of steatosis. Mildly contracted gallbladder without cholelithiasis. Electronically Signed   By: Annia Belt M.D.   On: 03/01/2022 13:32       Assessment & Plan:  Hypercholesterolemia Assessment & Plan: On lovastatin.  Low cholesterol diet and exercise.  Follow lipid panel and liver function tests.  Wants to hold on lab check today - getting full panel of labs soon.   Orders: -     CBC with Differential/Platelet; Future -     Hepatic function panel; Future -     Lipid panel; Future  Hyperglycemia Assessment & Plan: Low carb and exercise.  Follow met b and a1c.    Orders: -     Hemoglobin A1c; Future  Benign essential hypertension Assessment & Plan: Blood pressure on outside checks have been under reasonable control as outlined.  Continue lisinopril.  Follow metabolic panel.   Orders: -     Basic metabolic panel; Future  Essential hypertension, benign -     Chlorthalidone; Take 1 tablet (25 mg total) by mouth daily.  Dispense: 90 tablet; Refill: 3  Bilateral shoulder pain, unspecified chronicity Assessment & Plan: Saw ortho 07/16/23 - right shoulder pain.  S/p injection.  Still having issues with his shoulder.  Discussed stretches/exercise.    Peripheral vascular disease (HCC)  Assessment & Plan: Has seen [facility].  Continue risk factor modification.  Stable.     History of colonic polyps Assessment & Plan: Colonoscopy 06/06/20 - one 4mm polyp (transverse colon), one 2mm polyp in the sigmoid colon and one 3mm polyp in the sigmoid colon, diverticulosis.   Per pt, scheduled for f/u colonoscopy.  Recommended f/u colonoscopy in 2 years. Had f/u colonoscopy.  Need results.    Elevated ferritin Assessment & Plan: Has seen hematology.  W/up as outlined previously.  Decreased liver rich foods.  No iron supplements.  Follow cbc and iron studies.  Felt to be acute phase reaction. Has f/u soon with hematology.    Coronary artery disease of native artery of native heart with stable angina pectoris [facility]) Assessment & Plan: Saw cardiology.  Echo and zio monitor as outlined previously.  No symptoms. Stable. Continue risk factor modification.  Follow.    Atherosclerosis of native artery of both lower extremities with intermittent claudication (HCC) Assessment & Plan: Has been followed by [facility].  Has been stable.  Continue statin.    Anemia, unspecified type Assessment & Plan: Saw hematology 08/2022 - No clear etiology of anemia at this time.  May be anemia of chronic disease. Has f/u with hematology scheduled.    Other orders -     Felodipine ER; Take 1 tablet (10 mg total) by mouth daily.  Dispense: 90 tablet; Refill: 3 -     Lisinopril; Take 2 tablets (40 mg total) by mouth daily.  Dispense: 180 tablet; Refill: 3 -     Lovastatin; Take 1 tablet (40 mg total) by mouth daily.  Dispense: 90 tablet; Refill: 3     Dale Stanislaus, MD

## 2023-08-15 ENCOUNTER — Inpatient Hospital Stay: Payer: Medicare Other | Attending: Hematology

## 2023-08-15 DIAGNOSIS — M109 Gout, unspecified: Secondary | ICD-10-CM | POA: Diagnosis not present

## 2023-08-15 DIAGNOSIS — Z79899 Other long term (current) drug therapy: Secondary | ICD-10-CM | POA: Insufficient documentation

## 2023-08-15 DIAGNOSIS — R7989 Other specified abnormal findings of blood chemistry: Secondary | ICD-10-CM | POA: Insufficient documentation

## 2023-08-15 DIAGNOSIS — R790 Abnormal level of blood mineral: Secondary | ICD-10-CM | POA: Insufficient documentation

## 2023-08-15 DIAGNOSIS — D649 Anemia, unspecified: Secondary | ICD-10-CM | POA: Insufficient documentation

## 2023-08-15 LAB — COMPREHENSIVE METABOLIC PANEL
ALT: 18 U/L (ref 0–44)
AST: 22 U/L (ref 15–41)
Albumin: 3.9 g/dL (ref 3.5–5.0)
Alkaline Phosphatase: 71 U/L (ref 38–126)
Anion gap: 11 (ref 5–15)
BUN: 25 mg/dL — ABNORMAL HIGH (ref 8–23)
CO2: 24 mmol/L (ref 22–32)
Calcium: 9.5 mg/dL (ref 8.9–10.3)
Chloride: 101 mmol/L (ref 98–111)
Creatinine, Ser: 1.15 mg/dL (ref 0.61–1.24)
GFR, Estimated: >60 mL/min (ref >60)
Glucose, Bld: 92 mg/dL (ref 70–99)
Potassium: 3.9 mmol/L (ref 3.5–5.1)
Sodium: 136 mmol/L (ref 135–145)
Total Bilirubin: 0.4 mg/dL (ref <1.2)
Total Protein: 7.2 g/dL (ref 6.5–8.1)

## 2023-08-15 LAB — VITAMIN B12: Vitamin B-12: 392 pg/mL (ref 180–914)

## 2023-08-15 LAB — CBC WITH DIFFERENTIAL/PLATELET
Abs Immature Granulocytes: 0.06 10*3/uL (ref 0.00–0.07)
Basophils Absolute: 0 10*3/uL (ref 0.0–0.1)
Basophils Relative: 0 %
Eosinophils Absolute: 0.2 10*3/uL (ref 0.0–0.5)
Eosinophils Relative: 2 %
HCT: 37.6 % — ABNORMAL LOW (ref 39.0–52.0)
Hemoglobin: 12.5 g/dL — ABNORMAL LOW (ref 13.0–17.0)
Immature Granulocytes: 1 %
Lymphocytes Relative: 29 %
Lymphs Abs: 2.8 10*3/uL (ref 0.7–4.0)
MCH: 26.7 pg (ref 26.0–34.0)
MCHC: 33.2 g/dL (ref 30.0–36.0)
MCV: 80.2 fL (ref 80.0–100.0)
Monocytes Absolute: 1.1 10*3/uL — ABNORMAL HIGH (ref 0.1–1.0)
Monocytes Relative: 11 %
Neutro Abs: 5.4 10*3/uL (ref 1.7–7.7)
Neutrophils Relative %: 57 %
Platelets: 225 10*3/uL (ref 150–400)
RBC: 4.69 MIL/uL (ref 4.22–5.81)
RDW: 14.6 % (ref 11.5–15.5)
WBC: 9.5 10*3/uL (ref 4.0–10.5)
nRBC: 0 % (ref 0.0–0.2)

## 2023-08-15 LAB — FERRITIN: Ferritin: 301 ng/mL (ref 24–336)

## 2023-08-15 LAB — LACTATE DEHYDROGENASE: LDH: 102 U/L (ref 98–192)

## 2023-08-15 LAB — IRON AND TIBC
Iron: 61 ug/dL (ref 45–182)
Saturation Ratios: 19 % (ref 17.9–39.5)
TIBC: 324 ug/dL (ref 250–450)
UIBC: 263 ug/dL

## 2023-08-16 DIAGNOSIS — H5213 Myopia, bilateral: Secondary | ICD-10-CM | POA: Diagnosis not present

## 2023-08-18 ENCOUNTER — Encounter: Payer: Self-pay | Admitting: Internal Medicine

## 2023-08-18 NOTE — Assessment & Plan Note (Signed)
Saw ortho 07/16/23 - right shoulder pain.  S/p injection.  Still having issues with his shoulder.  Discussed stretches/exercise.

## 2023-08-18 NOTE — Assessment & Plan Note (Addendum)
Saw hematology 08/2022 - No clear etiology of anemia at this time.  May be anemia of chronic disease. Has f/u with hematology scheduled.

## 2023-08-18 NOTE — Assessment & Plan Note (Addendum)
On lovastatin.  Low cholesterol diet and exercise.  Follow lipid panel and liver function tests.  Wants to hold on lab check today - getting full panel of labs soon.

## 2023-08-18 NOTE — Assessment & Plan Note (Signed)
Has been followed by [facility].  Has been stable.  Continue statin.  

## 2023-08-18 NOTE — Assessment & Plan Note (Signed)
Blood pressure on outside checks have been under reasonable control as outlined.  Continue lisinopril.  Follow metabolic panel.

## 2023-08-18 NOTE — Assessment & Plan Note (Signed)
Has seen [facility].  Continue risk factor modification.  Stable.

## 2023-08-18 NOTE — Assessment & Plan Note (Signed)
Low carb and exercise.  Follow met b and a1c.   °

## 2023-08-18 NOTE — Assessment & Plan Note (Signed)
Has seen hematology.  W/up as outlined previously.  Decreased liver rich foods.  No iron supplements.  Follow cbc and iron studies.  Felt to be acute phase reaction. Has f/u soon with hematology.

## 2023-08-18 NOTE — Assessment & Plan Note (Signed)
Colonoscopy 06/06/20 - one 64mm polyp (transverse colon), one 6mm polyp in the sigmoid colon and one 35mm polyp in the sigmoid colon, diverticulosis.  Per pt, scheduled for f/u colonoscopy.  Recommended f/u colonoscopy in 2 years. Had f/u colonoscopy.  Need results.

## 2023-08-18 NOTE — Assessment & Plan Note (Addendum)
Saw cardiology.  Echo and zio monitor as outlined previously.  No symptoms. Stable. Continue risk factor modification.  Follow.

## 2023-08-21 DIAGNOSIS — K080 Exfoliation of teeth due to systemic causes: Secondary | ICD-10-CM | POA: Diagnosis not present

## 2023-08-22 ENCOUNTER — Inpatient Hospital Stay (HOSPITAL_BASED_OUTPATIENT_CLINIC_OR_DEPARTMENT_OTHER): Payer: Medicare Other | Admitting: Oncology

## 2023-08-22 VITALS — BP 141/79 | HR 95 | Temp 97.8°F | Resp 16 | Wt 275.8 lb

## 2023-08-22 DIAGNOSIS — R7989 Other specified abnormal findings of blood chemistry: Secondary | ICD-10-CM | POA: Diagnosis not present

## 2023-08-22 DIAGNOSIS — D649 Anemia, unspecified: Secondary | ICD-10-CM

## 2023-08-22 DIAGNOSIS — R790 Abnormal level of blood mineral: Secondary | ICD-10-CM | POA: Diagnosis not present

## 2023-08-22 DIAGNOSIS — Z79899 Other long term (current) drug therapy: Secondary | ICD-10-CM | POA: Diagnosis not present

## 2023-08-22 DIAGNOSIS — M109 Gout, unspecified: Secondary | ICD-10-CM | POA: Diagnosis not present

## 2023-08-22 LAB — METHYLMALONIC ACID, SERUM: Methylmalonic Acid, Quantitative: 180 nmol/L (ref 0–378)

## 2023-08-22 NOTE — Progress Notes (Signed)
[facility] [address], [place] [address]   CLINIC:  Medical Oncology/Hematology  PCP:  Dale Dania Beach, MD [address] [place] [address] [phone]   REASON FOR VISIT:  Follow-up for elevated ferritin and normocytic anemia  PRIOR THERAPY: None  CURRENT THERAPY: Surveillance  INTERVAL HISTORY:  Charles Nichols 72 y.o. male returns for routine follow-up of his elevated ferritin.  He was last seen by Rojelio Brenner, PA-C on 08/23/22.  At today's visit, he reports feeling well.    Reports being diagnosed with gout in July 2024.  He is currently followed by rheumatology Dr. Dimple Casey and was started on allopurinol.  He is currently on allopurinol 300 mg daily which appears to be keeping everything under control.  His quality of life has improved dramatically since he has lost about 50 pounds.  He has cut out all red meats, alcohol and anything that can make his gout flare.  He denies any abdominal pain or abnormal fatigue.   He does not take any iron supplement at home.  Reports he no longer eats red meat or organ meats.  He currently is eating a lot of fish and chicken.  He was given a dietary sheet to decrease risk for gout flares which she is trying to abide by.  He has 85% energy and 100% appetite. He endorses that he continues to lose weight due to increased physical activity, exercise and eating healthier.  Reports he weighed 325 pounds at his heaviest.  He weighed 275 pounds today.  He uses a cane for when he has to walk far distances but otherwise is steady on his feet.   REVIEW OF SYSTEMS:  Review of Systems  Musculoskeletal:  Positive for arthralgias (From gout).      PAST MEDICAL/SURGICAL HISTORY:  Past Medical History:  Diagnosis Date   Arthritis    Elevated ferritin    Negative work-up for hemochromatosis   History of chicken pox    Hypertension    Obesity    Peripheral vascular disease (HCC)    Past Surgical  History:  Procedure Laterality Date   CIRCUMCISION     COLONOSCOPY WITH PROPOFOL N/A 04/08/2015   Procedure: COLONOSCOPY WITH PROPOFOL;  Surgeon: Christena Deem, MD;  Location: [facility] ENDOSCOPY;  Service: Endoscopy;  Laterality: N/A;   ESOPHAGOGASTRODUODENOSCOPY N/A 04/08/2015   Procedure: ESOPHAGOGASTRODUODENOSCOPY (EGD);  Surgeon: Christena Deem, MD;  Location: [facility] ENDOSCOPY;  Service: Endoscopy;  Laterality: N/A;   FEMORAL-POPLITEAL BYPASS GRAFT Bilateral 1997, 2000   leg stent     pvd     SOCIAL HISTORY:  Social History   Socioeconomic History   Marital status: Legally Separated    Spouse name: Not on file   Number of children: Not on file   Years of education: Not on file   Highest education level: Not on file  Occupational History   Not on file  Tobacco Use   Smoking status: Former    Current packs/day: 0.00    Types: Cigarettes    Quit date: 1997    Years since quitting: 27.8    Passive exposure: Never   Smokeless tobacco: Never  Vaping Use   Vaping status: Never Used  Substance and Sexual Activity   Alcohol use: Not Currently   Drug use: No   Sexual activity: Not on file  Other Topics Concern   Not on file  Social History Narrative   Not on file   Social Determinants of Health  Financial Resource Strain: Low Risk  (04/15/2023)   Overall Financial Resource Strain (CARDIA)    Difficulty of Paying Living Expenses: Not hard at all  Food Insecurity: No Food Insecurity (04/15/2023)   Hunger Vital Sign    Worried About Running Out of Food in the Last Year: Never true    Ran Out of Food in the Last Year: Never true  Transportation Needs: No Transportation Needs (04/15/2023)   PRAPARE - Administrator, Civil Service (Medical): No    Lack of Transportation (Non-Medical): No  Physical Activity: Insufficiently Active (04/15/2023)   Exercise Vital Sign    Days of Exercise per Week: 2 days    Minutes of Exercise per Session: 30 min  Stress: No Stress Concern  Present (04/15/2023)   [employer] of Occupational Health - Occupational Stress Questionnaire    Feeling of Stress : Not at all  Social Connections: Moderately Isolated (04/15/2023)   Social Connection and Isolation Panel [NHANES]    Frequency of Communication with Friends and Family: More than three times a week    Frequency of Social Gatherings with Friends and Family: More than three times a week    Attends Religious Services: More than 4 times per year    Active Member of [employer] or Organizations: No    Attends Banker Meetings: Never    Marital Status: Separated  Intimate Partner Violence: Not At Risk (04/15/2023)   Humiliation, Afraid, Rape, and Kick questionnaire    Fear of Current or Ex-Partner: No    Emotionally Abused: No    Physically Abused: No    Sexually Abused: No    FAMILY HISTORY:  Family History  Problem Relation Age of Onset   Breast cancer Sister    Hypertension Sister    Hypertension Brother    Breast cancer Daughter     CURRENT MEDICATIONS:  Outpatient Encounter Medications as of 08/22/2023  Medication Sig   acetaminophen (TYLENOL) 650 MG CR tablet Take 650 mg by mouth every 8 (eight) hours as needed for pain.   allopurinol (ZYLOPRIM) 300 MG tablet Take 1 tablet (300 mg total) by mouth daily.   aspirin 81 MG tablet Take 81 mg by mouth daily.   chlorthalidone (HYGROTON) 25 MG tablet Take 1 tablet (25 mg total) by mouth daily.   Cholecalciferol (VITAMIN D3) 2000 UNITS TABS Take 1 tablet by mouth daily.   clobetasol cream (TEMOVATE) 0.05 % Apply 1 Application topically 2 (two) times daily.   doxycycline (VIBRAMYCIN) 100 MG capsule Take 100 mg by mouth daily.   felodipine (PLENDIL) 10 MG 24 hr tablet Take 1 tablet (10 mg total) by mouth daily.   lisinopril (ZESTRIL) 20 MG tablet Take 2 tablets (40 mg total) by mouth daily.   lovastatin (MEVACOR) 40 MG tablet Take 1 tablet (40 mg total) by mouth daily.   naproxen (NAPROSYN) 500 MG tablet Take 500  mg by mouth 2 (two) times daily.   triamcinolone cream (KENALOG) 0.1 % APPLY TO ITCHY AREAS ON ARMS AND SCALP DAILY   No facility-administered encounter medications on file as of 08/22/2023.    ALLERGIES:  No Known Allergies   PHYSICAL EXAM:  ECOG PERFORMANCE STATUS: 0 - Asymptomatic  Vitals:   08/22/23 1004  BP: (!) 141/79  Pulse: 95  Resp: 16  Temp: 97.8 F (36.6 C)  SpO2: 98%   Filed Weights   08/22/23 1004  Weight: 275 lb 12.8 oz (125.1 kg)   Physical Exam Constitutional:  Appearance: Normal appearance. He is obese.  Cardiovascular:     Rate and Rhythm: Normal rate and regular rhythm.  Pulmonary:     Effort: Pulmonary effort is normal.     Breath sounds: Normal breath sounds.  Abdominal:     General: Bowel sounds are normal.     Palpations: Abdomen is soft.  Musculoskeletal:        General: No swelling. Normal range of motion.  Neurological:     Mental Status: He is alert and oriented to person, place, and time. Mental status is at baseline.      LABORATORY DATA:  I have reviewed the labs as listed.  CBC    Component Value Date/Time   WBC 9.5 08/15/2023 1022   RBC 4.69 08/15/2023 1022   HGB 12.5 (L) 08/15/2023 1022   HCT 37.6 (L) 08/15/2023 1022   PLT 225 08/15/2023 1022   MCV 80.2 08/15/2023 1022   MCH 26.7 08/15/2023 1022   MCHC 33.2 08/15/2023 1022   RDW 14.6 08/15/2023 1022   LYMPHSABS 2.8 08/15/2023 1022   MONOABS 1.1 (H) 08/15/2023 1022   EOSABS 0.2 08/15/2023 1022   BASOSABS 0.0 08/15/2023 1022      Latest Ref Rng & Units 08/15/2023   10:22 AM 05/09/2023    3:15 PM 03/01/2023   10:40 AM  CMP  Glucose 70 - 99 mg/dL 92  91  98   BUN 8 - 23 mg/dL 25  20  17    Creatinine 0.61 - 1.24 mg/dL 8.46  9.62  9.52   Sodium 135 - 145 mmol/L 136  134  138   Potassium 3.5 - 5.1 mmol/L 3.9  4.0  4.0   Chloride 98 - 111 mmol/L 101  100  102   CO2 22 - 32 mmol/L 24  25  28    Calcium 8.9 - 10.3 mg/dL 9.5  84.1  9.6   Total Protein 6.5 - 8.1 g/dL  7.2  7.5    Total Bilirubin <1.2 mg/dL 0.4  0.4    Alkaline Phos 38 - 126 U/L 71  72    AST 15 - 41 U/L 22  17    ALT 0 - 44 U/L 18  12      DIAGNOSTIC IMAGING:  I have independently reviewed the relevant imaging and discussed with the patient.  ASSESSMENT & PLAN: 1.  Elevated ferritin: - Patient evaluated for elevated ferritin since at least 2017 (range 540-840).  Percent saturation was within normal limits. - Patient reports that he was tested for elevated ferritin at [facility] 10 years ago. - He has arthritis of multiple joints.  No known liver problems.  No neuropathy other than right foot drop.  No prior transfusion history. - He was donating blood until 4 years ago.  - He does not take any iron supplements.  He previously drank occasional alcohol and frequently liver pudding, but he has cut these out of his diet since May 2023. - Hematology work-up (01/26/2022):  Hemochromatosis mutational analysis negative Ferritin 495, iron saturation 24%.  Elevated vitamin B12 at 3427 may also be acute phase reactant. CBC with Hgb 11.6/MCV 81.8, otherwise unremarkable Elevated CRP 1.5, elevated ESR 75.  Normal rheumatoid factor and ANA.  Normal LDH. - Abdominal ultrasound (03/01/2022): Increased hepatic parenchymal echogenicity suggestive of steatosis - With ferritin <1000, he is unlikely to have any organ iron deposition.  2.  Elevated vitamin B12 - Labs from April 2023 showed elevated B12 at 3427. - Patient stopped taking  vitamin B12 supplements in May 2023  3.  Normocytic anemia: - He has mild normocytic anemia since 2015. - Reports that he was donating blood regularly up until 4 years ago.   - Colonoscopy in 2017 was unremarkable (diverticulosis in sigmoid colon). - He denies any bleeding per rectum or melena.  - Hematology work-up (01/26/2022):  Ferritin 495, iron saturation 24% CBC with Hgb 11.6/MCV 81.8, otherwise unremarkable Elevated CRP 1.5, elevated ESR 75.  Normal rheumatoid factor  and ANA.  Normal LDH. Normal SPEP.  Normal reticulocytes. No apparent deficiency in copper, folate, B12, methylmalonic acid. Creatinine 1.16/GFR >60  4.  Social/family history: - He retired from [employer].  Exposure to dyes and acids.  Quit smoking 1997 after arterial bypass surgery on right leg.  Occasional alcohol intake. - No family history of hemochromatosis. - 2 sisters had breast cancer.  Daughter died of breast cancer.  PLAN: 1. Elevated ferritin - Most recent labs (08/15/2023): Ferritin 301, iron saturation 19%.  Normal LFTs on CMP.  Normal LDH. - Etiology thought to be secondary to fatty liver disease and acute phase reactant secondary to chronic inflammation (elevated ESR and CRP).  There is some low possibility that he may have hemochromatosis from undiscovered mutation. -Continue to avoid iron supplementation and cut back on iron rich foods such as liver pudding. - We will continue to monitor with repeat labs and RTC in 1 year.  2. Elevated vitamin B12 level - Most recent labs (08/15/2023) showed normal B12 level 392 (6770) and normal MMA. -We discussed restarting B12 supplements every other day 1000 mcg.  Will recheck B12 and MMA in 1 year.  3. Anemia, unspecified type - Most recent labs (08/15/2023): Hgb 12.5/MCV 80.2.   - No clear etiology of anemia at this time.  May be anemia of chronic disease. - We will continue to monitor with repeat labs and RTC in 1 year - If any significant deviations from baseline would consider bone marrow biopsy.  PLAN SUMMARY: PLAN SUMMARY: >> Restart B12 supplements every other day 1000 mcg. >> Recommend follow-up in 1 year with labs a few days before.     All questions were answered. The patient knows to call the clinic with any problems, questions or concerns.  Medical decision making: Moderate  Time spent on visit: I spent 25 minutes dedicated to the care of this patient (face-to-face and non-face-to-face) on the date of the  encounter to include what is described in the assessment and plan.   Mauro Kaufmann, NP  08/23/2022 10:53 AM

## 2023-08-23 ENCOUNTER — Encounter: Payer: Self-pay | Admitting: Internal Medicine

## 2023-08-23 DIAGNOSIS — K760 Fatty (change of) liver, not elsewhere classified: Secondary | ICD-10-CM | POA: Insufficient documentation

## 2023-08-29 DIAGNOSIS — M79671 Pain in right foot: Secondary | ICD-10-CM | POA: Diagnosis not present

## 2023-08-29 DIAGNOSIS — M7751 Other enthesopathy of right foot: Secondary | ICD-10-CM | POA: Diagnosis not present

## 2023-08-29 DIAGNOSIS — G5750 Tarsal tunnel syndrome, unspecified lower limb: Secondary | ICD-10-CM | POA: Diagnosis not present

## 2023-08-29 DIAGNOSIS — M7752 Other enthesopathy of left foot: Secondary | ICD-10-CM | POA: Diagnosis not present

## 2023-09-10 ENCOUNTER — Telehealth: Payer: Self-pay | Admitting: Internal Medicine

## 2023-09-10 NOTE — Telephone Encounter (Signed)
Pt called wanting to discuss labs done at [facility]

## 2023-09-11 ENCOUNTER — Other Ambulatory Visit (HOSPITAL_COMMUNITY)
Admission: RE | Admit: 2023-09-11 | Discharge: 2023-09-11 | Disposition: A | Discharge status: Home / Self Care | Payer: Medicare Other | Source: Ambulatory Visit | Attending: Internal Medicine | Admitting: Internal Medicine

## 2023-09-11 DIAGNOSIS — E7800 Pure hypercholesterolemia, unspecified: Secondary | ICD-10-CM | POA: Insufficient documentation

## 2023-09-11 DIAGNOSIS — Z01812 Encounter for preprocedural laboratory examination: Secondary | ICD-10-CM | POA: Diagnosis not present

## 2023-09-11 DIAGNOSIS — R7309 Other abnormal glucose: Secondary | ICD-10-CM | POA: Insufficient documentation

## 2023-09-11 DIAGNOSIS — I10 Essential (primary) hypertension: Secondary | ICD-10-CM | POA: Insufficient documentation

## 2023-09-11 LAB — CBC WITH DIFFERENTIAL/PLATELET
Abs Immature Granulocytes: 0.04 10*3/uL (ref 0.00–0.07)
Basophils Absolute: 0 10*3/uL (ref 0.0–0.1)
Basophils Relative: 0 %
Eosinophils Absolute: 0.1 10*3/uL (ref 0.0–0.5)
Eosinophils Relative: 1 %
HCT: 36.8 % — ABNORMAL LOW (ref 39.0–52.0)
Hemoglobin: 12.3 g/dL — ABNORMAL LOW (ref 13.0–17.0)
Immature Granulocytes: 1 %
Lymphocytes Relative: 21 %
Lymphs Abs: 1.6 10*3/uL (ref 0.7–4.0)
MCH: 27 pg (ref 26.0–34.0)
MCHC: 33.4 g/dL (ref 30.0–36.0)
MCV: 80.9 fL (ref 80.0–100.0)
Monocytes Absolute: 0.8 10*3/uL (ref 0.1–1.0)
Monocytes Relative: 11 %
Neutro Abs: 5.1 10*3/uL (ref 1.7–7.7)
Neutrophils Relative %: 66 %
Platelets: 207 10*3/uL (ref 150–400)
RBC: 4.55 MIL/uL (ref 4.22–5.81)
RDW: 14.9 % (ref 11.5–15.5)
WBC: 7.6 10*3/uL (ref 4.0–10.5)
nRBC: 0 % (ref 0.0–0.2)

## 2023-09-11 LAB — HEPATIC FUNCTION PANEL
ALT: 15 U/L (ref 0–44)
AST: 20 U/L (ref 15–41)
Albumin: 3.7 g/dL (ref 3.5–5.0)
Alkaline Phosphatase: 66 U/L (ref 38–126)
Bilirubin, Direct: <0.1 mg/dL (ref 0.0–0.2)
Total Bilirubin: 0.3 mg/dL (ref <1.2)
Total Protein: 7.2 g/dL (ref 6.5–8.1)

## 2023-09-11 LAB — HEMOGLOBIN A1C
Hgb A1c MFr Bld: 5.7 % — ABNORMAL HIGH (ref 4.8–5.6)
Mean Plasma Glucose: 116.89 mg/dL

## 2023-09-11 LAB — LIPID PANEL
Cholesterol: 96 mg/dL (ref 0–200)
HDL: 36 mg/dL — ABNORMAL LOW (ref >40)
LDL Cholesterol: 49 mg/dL (ref 0–99)
Total CHOL/HDL Ratio: 2.7 {ratio}
Triglycerides: 56 mg/dL (ref <150)
VLDL: 11 mg/dL (ref 0–40)

## 2023-09-11 LAB — BASIC METABOLIC PANEL
Anion gap: 8 (ref 5–15)
BUN: 21 mg/dL (ref 8–23)
CO2: 24 mmol/L (ref 22–32)
Calcium: 9.5 mg/dL (ref 8.9–10.3)
Chloride: 103 mmol/L (ref 98–111)
Creatinine, Ser: 1.14 mg/dL (ref 0.61–1.24)
GFR, Estimated: >60 mL/min (ref >60)
Glucose, Bld: 101 mg/dL — ABNORMAL HIGH (ref 70–99)
Potassium: 3.8 mmol/L (ref 3.5–5.1)
Sodium: 135 mmol/L (ref 135–145)

## 2023-09-11 NOTE — Addendum Note (Signed)
Addended by: Rita Ohara D on: 09/11/2023 09:52 AM   Modules accepted: Orders

## 2023-09-11 NOTE — Telephone Encounter (Signed)
Pt walked in to discuss. His labs were supposed to be done with his labs through oncology at [facility] but was not drawn. Printed lab orders and sent with patient for him to have them done. He wanted to have done by end of year.

## 2023-09-18 ENCOUNTER — Telehealth: Payer: Self-pay | Admitting: Internal Medicine

## 2023-09-18 NOTE — Telephone Encounter (Signed)
Letter dictated

## 2023-10-16 DIAGNOSIS — M75102 Unspecified rotator cuff tear or rupture of left shoulder, not specified as traumatic: Secondary | ICD-10-CM | POA: Diagnosis not present

## 2023-10-16 DIAGNOSIS — M19011 Primary osteoarthritis, right shoulder: Secondary | ICD-10-CM | POA: Diagnosis not present

## 2023-10-16 DIAGNOSIS — M75101 Unspecified rotator cuff tear or rupture of right shoulder, not specified as traumatic: Secondary | ICD-10-CM | POA: Diagnosis not present

## 2023-10-16 DIAGNOSIS — G8929 Other chronic pain: Secondary | ICD-10-CM | POA: Diagnosis not present

## 2023-10-30 DIAGNOSIS — M170 Bilateral primary osteoarthritis of knee: Secondary | ICD-10-CM | POA: Diagnosis not present
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
## 2023-11-27 ENCOUNTER — Other Ambulatory Visit: Payer: Self-pay | Admitting: Internal Medicine

## 2023-11-27 DIAGNOSIS — M1A09X0 Idiopathic chronic gout, multiple sites, without tophus (tophi): Secondary | ICD-10-CM

## 2023-11-27 NOTE — Telephone Encounter (Signed)
Last Fill: 06/03/2023  Labs: 09/11/2023 Hgb 12.3, Hct 36.8, Glucose 101, 06/03/2023 Uric Acid 6.2  Next Visit: 12/04/2023  Last Visit: 06/03/2023  DX: Idiopathic chronic gout of multiple sites without tophus   Current Dose per office note 06/03/2023: allopurinol (ZYLOPRIM) 300 MG tablet   Okay to refill Allopurinol?

## 2023-12-04 ENCOUNTER — Ambulatory Visit: Payer: Medicare Other | Attending: Internal Medicine | Admitting: Internal Medicine

## 2023-12-04 ENCOUNTER — Encounter: Payer: Self-pay | Admitting: Internal Medicine

## 2023-12-04 VITALS — BP 131/75 | HR 82 | Resp 14 | Ht 75.0 in | Wt 258.0 lb

## 2023-12-04 DIAGNOSIS — Z5181 Encounter for therapeutic drug level monitoring: Secondary | ICD-10-CM | POA: Diagnosis not present

## 2023-12-04 DIAGNOSIS — M25511 Pain in right shoulder: Secondary | ICD-10-CM | POA: Diagnosis not present

## 2023-12-04 DIAGNOSIS — M25512 Pain in left shoulder: Secondary | ICD-10-CM

## 2023-12-04 DIAGNOSIS — M1A09X0 Idiopathic chronic gout, multiple sites, without tophus (tophi): Secondary | ICD-10-CM

## 2023-12-04 MED ORDER — ALLOPURINOL 300 MG PO TABS: 300.0000 mg | ORAL_TABLET | Freq: Every day | ORAL | 2 refills | Status: AC

## 2023-12-04 NOTE — Patient Instructions (Signed)
 I recommend aiming for 2g (2000mg ) per day of omega-3 to give benefit for arthritis symptoms. Lower doses are also good but probably just helping more for cholesterol.

## 2024-01-01 DIAGNOSIS — M19011 Primary osteoarthritis, right shoulder: Secondary | ICD-10-CM | POA: Diagnosis not present

## 2024-01-01 DIAGNOSIS — M7581 Other shoulder lesions, right shoulder: Secondary | ICD-10-CM | POA: Diagnosis not present

## 2024-01-01 DIAGNOSIS — M75101 Unspecified rotator cuff tear or rupture of right shoulder, not specified as traumatic: Secondary | ICD-10-CM | POA: Diagnosis not present

## 2024-01-01 DIAGNOSIS — M25811 Other specified joint disorders, right shoulder: Secondary | ICD-10-CM | POA: Diagnosis not present

## 2024-01-06 DIAGNOSIS — M25611 Stiffness of right shoulder, not elsewhere classified: Secondary | ICD-10-CM | POA: Diagnosis not present

## 2024-01-06 DIAGNOSIS — M25811 Other specified joint disorders, right shoulder: Secondary | ICD-10-CM | POA: Diagnosis not present

## 2024-01-06 DIAGNOSIS — M249 Joint derangement, unspecified: Secondary | ICD-10-CM | POA: Diagnosis not present

## 2024-01-06 DIAGNOSIS — M75101 Unspecified rotator cuff tear or rupture of right shoulder, not specified as traumatic: Secondary | ICD-10-CM | POA: Diagnosis not present

## 2024-01-15 DIAGNOSIS — M249 Joint derangement, unspecified: Secondary | ICD-10-CM | POA: Diagnosis not present

## 2024-01-15 DIAGNOSIS — M75101 Unspecified rotator cuff tear or rupture of right shoulder, not specified as traumatic: Secondary | ICD-10-CM | POA: Diagnosis not present

## 2024-01-15 DIAGNOSIS — M25811 Other specified joint disorders, right shoulder: Secondary | ICD-10-CM | POA: Diagnosis not present

## 2024-01-21 DIAGNOSIS — M75101 Unspecified rotator cuff tear or rupture of right shoulder, not specified as traumatic: Secondary | ICD-10-CM | POA: Diagnosis not present

## 2024-01-21 DIAGNOSIS — M25811 Other specified joint disorders, right shoulder: Secondary | ICD-10-CM | POA: Diagnosis not present

## 2024-01-21 DIAGNOSIS — M249 Joint derangement, unspecified: Secondary | ICD-10-CM | POA: Diagnosis not present

## 2024-01-24 DIAGNOSIS — M25811 Other specified joint disorders, right shoulder: Secondary | ICD-10-CM | POA: Diagnosis not present

## 2024-01-24 DIAGNOSIS — M249 Joint derangement, unspecified: Secondary | ICD-10-CM | POA: Diagnosis not present

## 2024-01-24 DIAGNOSIS — M75101 Unspecified rotator cuff tear or rupture of right shoulder, not specified as traumatic: Secondary | ICD-10-CM | POA: Diagnosis not present

## 2024-01-26 NOTE — Progress Notes (Addendum)
 Subjective:    Patient ID: Charles Nichols, male    DOB: Nov 21, 1950, 73 y.o.   MRN: [id number]  Patient here for  Chief Complaint  Patient presents with   Annual Exam    HPI Here for a physical exam.  Has been seeing ortho and PT for right shoulder pain. His MRI was obtained on 01/08/2024 and demonstrated overall moderate appearing tendinosis to the supraspinatus and infraspinatus with an area of possible full-thickness tearing to the supraspinatus tendon as well as interstitial tearing throughout the tendon. S/p injection 01/06/24. Therapy is going well. Shoulder is doing some better. Doing exercise at home. Seeing rheumatology for f/u gout. On allopurinol . Has adjusted his diet. Has lost weight. Seeing hematology. Last vist, 08/2023 - recommended to continue to avoid iron supplementation. Recommended to continue to follow cbc and iron studies. Also recommended to start B12 1000mcg every other day. He is trying to stay active. No chest pain. Breathing stable. No abdominal pain or bowel change. Need colonoscopy results.    Past Medical History:  Diagnosis Date   Arthritis    Elevated ferritin    Negative work-up for hemochromatosis   History of chicken pox    Hypertension    Obesity    Peripheral vascular disease (HCC)    Past Surgical History:  Procedure Laterality Date   CIRCUMCISION     COLONOSCOPY WITH PROPOFOL  N/A 04/08/2015   Procedure: COLONOSCOPY WITH PROPOFOL ;  Surgeon: Deveron Fly, MD;  Location: [facility] ENDOSCOPY;  Service: Endoscopy;  Laterality: N/A;   ESOPHAGOGASTRODUODENOSCOPY N/A 04/08/2015   Procedure: ESOPHAGOGASTRODUODENOSCOPY (EGD);  Surgeon: Deveron Fly, MD;  Location: [facility] ENDOSCOPY;  Service: Endoscopy;  Laterality: N/A;   FEMORAL-POPLITEAL BYPASS GRAFT Bilateral 1997, 2000   leg stent     pvd   Family History  Problem Relation Age of Onset   Breast cancer Sister    Hypertension Sister    Hypertension Brother    Breast cancer Daughter    Social  History   Socioeconomic History   Marital status: Legally Separated    Spouse name: Not on file   Number of children: Not on file   Years of education: Not on file   Highest education level: Not on file  Occupational History   Not on file  Tobacco Use   Smoking status: Former    Current packs/day: 0.00    Types: Cigarettes    Quit date: 1997    Years since quitting: 28.3    Passive exposure: Never   Smokeless tobacco: Never  Vaping Use   Vaping status: Never Used  Substance and Sexual Activity   Alcohol use: Not Currently   Drug use: No   Sexual activity: Not on file  Other Topics Concern   Not on file  Social History Narrative   Not on file   Social Drivers of Health   Financial Resource Strain: Low Risk  (04/15/2023)   Overall Financial Resource Strain (CARDIA)    Difficulty of Paying Living Expenses: Not hard at all  Food Insecurity: No Food Insecurity (04/15/2023)   Hunger Vital Sign    Worried About Running Out of Food in the Last Year: Never true    Ran Out of Food in the Last Year: Never true  Transportation Needs: No Transportation Needs (04/15/2023)   PRAPARE - Administrator, Civil Service (Medical): No    Lack of Transportation (Non-Medical): No  Physical Activity: Insufficiently Active (04/15/2023)   Exercise Vital Sign  Days of Exercise per Week: 2 days    Minutes of Exercise per Session: 30 min  Stress: No Stress Concern Present (04/15/2023)   [employer] of Occupational Health - Occupational Stress Questionnaire    Feeling of Stress : Not at all  Social Connections: Moderately Isolated (04/15/2023)   Social Connection and Isolation Panel [NHANES]    Frequency of Communication with Friends and Family: More than three times a week    Frequency of Social Gatherings with Friends and Family: More than three times a week    Attends Religious Services: More than 4 times per year    Active Member of [employer] or Organizations: No    Attends Tax inspector Meetings: Never    Marital Status: Separated     Review of Systems  Constitutional:  Negative for appetite change and unexpected weight change.  HENT:  Negative for congestion, sinus pressure and sore throat.   Eyes:  Negative for pain and visual disturbance.  Respiratory:  Negative for cough, chest tightness and shortness of breath.   Cardiovascular:  Negative for chest pain and palpitations.       No increased swelling.   Gastrointestinal:  Negative for abdominal pain, diarrhea, nausea and vomiting.  Genitourinary:  Negative for difficulty urinating and dysuria.  Musculoskeletal:  Negative for joint swelling and myalgias.  Skin:  Negative for color change and rash.  Neurological:  Negative for dizziness and headaches.  Hematological:  Negative for adenopathy. Does not bruise/bleed easily.  Psychiatric/Behavioral:  Negative for agitation and dysphoric mood.        Objective:     BP 124/72   Pulse 60   Temp 98 F (36.7 C)   Resp 16   Ht 6\' 3"  (1.905 m)   Wt 254 lb (115.2 kg)   SpO2 99%   BMI 31.75 kg/m  Wt Readings from Last 3 Encounters:  01/27/24 254 lb (115.2 kg)  12/04/23 258 lb (117 kg)  08/22/23 275 lb 12.8 oz (125.1 kg)    Physical Exam Constitutional:      General: He is not in acute distress.    Appearance: Normal appearance. He is well-developed.  HENT:     Head: Normocephalic and atraumatic.     Right Ear: External ear normal.     Left Ear: External ear normal.     Mouth/Throat:     Pharynx: No oropharyngeal exudate or posterior oropharyngeal erythema.  Eyes:     General: No scleral icterus.       Right eye: No discharge.        Left eye: No discharge.     Conjunctiva/sclera: Conjunctivae normal.  Neck:     Thyroid : No thyromegaly.  Cardiovascular:     Rate and Rhythm: Normal rate and regular rhythm.  Pulmonary:     Effort: No respiratory distress.     Breath sounds: Normal breath sounds. No wheezing.  Abdominal:     General:  Bowel sounds are normal.     Palpations: Abdomen is soft.     Tenderness: There is no abdominal tenderness.  Musculoskeletal:        General: No tenderness.     Cervical back: Neck supple. No tenderness.     Comments: No increased swelling.   Lymphadenopathy:     Cervical: No cervical adenopathy.  Skin:    Findings: No erythema or rash.  Neurological:     Mental Status: He is alert and oriented to person, place, and time.  Psychiatric:  Mood and Affect: Mood normal.        Behavior: Behavior normal.         Outpatient Encounter Medications as of 01/27/2024  Medication Sig   methocarbamol (ROBAXIN) 500 MG tablet Take 1,000 mg by mouth.   acetaminophen (TYLENOL) 650 MG CR tablet Take 650 mg by mouth every 8 (eight) hours as needed for pain.   allopurinol  (ZYLOPRIM ) 300 MG tablet Take 1 tablet (300 mg total) by mouth daily.   aspirin 81 MG tablet Take 81 mg by mouth daily.   chlorthalidone  (HYGROTON ) 25 MG tablet Take 1 tablet (25 mg total) by mouth daily.   Cholecalciferol (VITAMIN D3) 2000 UNITS TABS Take 1 tablet by mouth daily.   clobetasol  cream (TEMOVATE ) 0.05 % Apply 1 Application topically 2 (two) times daily.   cyanocobalamin  (VITAMIN B12) 1000 MCG tablet Take 1,000 mcg by mouth every other day.   felodipine  (PLENDIL ) 10 MG 24 hr tablet Take 1 tablet (10 mg total) by mouth daily.   lisinopril  (ZESTRIL ) 20 MG tablet Take 2 tablets (40 mg total) by mouth daily.   lovastatin  (MEVACOR ) 40 MG tablet Take 1 tablet (40 mg total) by mouth daily.   naproxen (NAPROSYN) 500 MG tablet Take 500 mg by mouth 2 (two) times daily.   triamcinolone  cream (KENALOG) 0.1 % APPLY TO ITCHY AREAS ON ARMS AND SCALP DAILY   [DISCONTINUED] doxycycline  (VIBRAMYCIN ) 100 MG capsule Take 100 mg by mouth daily. (Patient not taking: Reported on 12/04/2023)   No facility-administered encounter medications on file as of 01/27/2024.     Lab Results  Component Value Date   WBC 7.6 09/11/2023   HGB  12.3 (L) 09/11/2023   HCT 36.8 (L) 09/11/2023   PLT 207 09/11/2023   GLUCOSE 95 01/27/2024   CHOL 117 01/27/2024   TRIG 76.0 01/27/2024   HDL 43.30 01/27/2024   LDLDIRECT 78.0 06/14/2021   LDLCALC 59 01/27/2024   ALT 9 01/27/2024   AST 14 01/27/2024   NA 135 01/27/2024   K 3.7 01/27/2024   CL 99 01/27/2024   CREATININE 0.94 01/27/2024   BUN 12 01/27/2024   CO2 27 01/27/2024   TSH 2.71 01/27/2024   PSA 2.28 01/27/2024   HGBA1C 5.8 01/27/2024       Assessment & Plan:  Routine general medical examination at a health care facility  Hyperglycemia Assessment & Plan: Low carb diet and exercise. Follow met b and A1c.   Orders: -     Hemoglobin A1c  Hypercholesterolemia Assessment & Plan: On lovastatin .  Low cholesterol diet and exercise.  Follow lipid panel and liver function tests.  Check fasting labs today.   Orders: -     Lipid panel -     Hepatic function panel -     TSH  Benign essential hypertension Assessment & Plan: Blood pressure on outside checks have been under reasonable control as outlined.  Continue lisinopril .  Follow metabolic panel. Recheck by me today 128/78. Check metabolic panel today.   Orders: -     Basic metabolic panel with GFR  Health care maintenance Assessment & Plan: Physical today 01/27/24.  Colonoscopy 06/06/20 - one 4mm polyp (transverse colon), one 2mm polyp in the sigmoid colon and one 3mm polyp in the sigmoid colon, diverticulosis.  Check psa today.    Colon cancer screening -     Ambulatory referral to Gastroenterology  Prostate cancer screening -     PSA, Medicare  Anemia, unspecified type Assessment & Plan: Seeing  hematology. Cbc being followed by hematology.    Coronary artery disease of native artery of native heart with stable angina pectoris [facility]) Assessment & Plan: Saw cardiology.  Echo and zio monitor as outlined previously.  No symptoms. Stable. Continue risk factor modification.    Elevated ferritin Assessment &  Plan: Seeing hematology. Last vist, 08/2023 - recommended to continue to avoid iron supplementation. Recommended to continue to follow cbc and iron studies. Also recommended to start B12 1000mcg every other day.    Atherosclerosis of native artery of both lower extremities with intermittent claudication (HCC) Assessment & Plan: Has been followed previously by [facility].  Has been stable.  Continue statin.    Peripheral vascular disease (HCC) Assessment & Plan: Has seen [facility].  Continue risk factor modification.  Stable.     Bilateral shoulder pain, unspecified chronicity Assessment & Plan:  Has been seeing ortho and PT for right shoulder pain. His MRI was obtained on 01/08/2024 and demonstrated overall moderate appearing tendinosis to the supraspinatus and infraspinatus with an area of possible full-thickness tearing to the supraspinatus tendon as well as interstitial tearing throughout the tendon. S/p injection 01/06/24. Therapy is going well. Shoulder is doing some better. Doing exercise at home.    Idiopathic chronic gout of multiple sites without tophus Assessment & Plan: Followed by rheumatology.  Doing well on allopurinol .     History of colonic polyps Assessment & Plan: Addendum - received report regarding colonoscopy 05/2020. Recommended f/u in 3 years. Discussed with Mr Rogus. Agreeable for referral. Order placed for referral to Dr Terese Fendt.   Orders: -     Ambulatory referral to Gastroenterology     Dellar Fenton, MD

## 2024-01-27 ENCOUNTER — Encounter: Payer: Self-pay | Admitting: Internal Medicine

## 2024-01-27 ENCOUNTER — Ambulatory Visit (INDEPENDENT_AMBULATORY_CARE_PROVIDER_SITE_OTHER): Admitting: Internal Medicine

## 2024-01-27 VITALS — BP 124/72 | HR 60 | Temp 98.0°F | Resp 16 | Ht 75.0 in | Wt 254.0 lb

## 2024-01-27 DIAGNOSIS — I10 Essential (primary) hypertension: Secondary | ICD-10-CM | POA: Diagnosis not present

## 2024-01-27 DIAGNOSIS — Z860100 Personal history of colon polyps, unspecified: Secondary | ICD-10-CM

## 2024-01-27 DIAGNOSIS — E7800 Pure hypercholesterolemia, unspecified: Secondary | ICD-10-CM | POA: Diagnosis not present

## 2024-01-27 DIAGNOSIS — R739 Hyperglycemia, unspecified: Secondary | ICD-10-CM

## 2024-01-27 DIAGNOSIS — Z0000 Encounter for general adult medical examination without abnormal findings: Secondary | ICD-10-CM

## 2024-01-27 DIAGNOSIS — R7989 Other specified abnormal findings of blood chemistry: Secondary | ICD-10-CM

## 2024-01-27 DIAGNOSIS — M1A09X0 Idiopathic chronic gout, multiple sites, without tophus (tophi): Secondary | ICD-10-CM

## 2024-01-27 DIAGNOSIS — I70213 Atherosclerosis of native arteries of extremities with intermittent claudication, bilateral legs: Secondary | ICD-10-CM

## 2024-01-27 DIAGNOSIS — I25118 Atherosclerotic heart disease of native coronary artery with other forms of angina pectoris: Secondary | ICD-10-CM

## 2024-01-27 DIAGNOSIS — M25512 Pain in left shoulder: Secondary | ICD-10-CM

## 2024-01-27 DIAGNOSIS — I739 Peripheral vascular disease, unspecified: Secondary | ICD-10-CM

## 2024-01-27 DIAGNOSIS — Z1211 Encounter for screening for malignant neoplasm of colon: Secondary | ICD-10-CM

## 2024-01-27 DIAGNOSIS — Z125 Encounter for screening for malignant neoplasm of prostate: Secondary | ICD-10-CM

## 2024-01-27 DIAGNOSIS — M25511 Pain in right shoulder: Secondary | ICD-10-CM

## 2024-01-27 DIAGNOSIS — D649 Anemia, unspecified: Secondary | ICD-10-CM

## 2024-01-27 LAB — BASIC METABOLIC PANEL WITH GFR
BUN: 12 mg/dL (ref 6–23)
CO2: 27 meq/L (ref 19–32)
Calcium: 9.9 mg/dL (ref 8.4–10.5)
Chloride: 99 meq/L (ref 96–112)
Creatinine, Ser: 0.94 mg/dL (ref 0.40–1.50)
GFR: 80.78 mL/min (ref >60.00)
Glucose, Bld: 95 mg/dL (ref 70–99)
Potassium: 3.7 meq/L (ref 3.5–5.1)
Sodium: 135 meq/L (ref 135–145)

## 2024-01-27 LAB — HEPATIC FUNCTION PANEL
ALT: 9 U/L (ref 0–53)
AST: 14 U/L (ref 0–37)
Albumin: 4.2 g/dL (ref 3.5–5.2)
Alkaline Phosphatase: 66 U/L (ref 39–117)
Bilirubin, Direct: 0.1 mg/dL (ref 0.0–0.3)
Total Bilirubin: 0.5 mg/dL (ref 0.2–1.2)
Total Protein: 6.7 g/dL (ref 6.0–8.3)

## 2024-01-27 LAB — LIPID PANEL
Cholesterol: 117 mg/dL (ref 0–200)
HDL: 43.3 mg/dL (ref >39.00)
LDL Cholesterol: 59 mg/dL (ref 0–99)
NonHDL: 73.73
Total CHOL/HDL Ratio: 3
Triglycerides: 76 mg/dL (ref 0.0–149.0)
VLDL: 15.2 mg/dL (ref 0.0–40.0)

## 2024-01-27 LAB — PSA, MEDICARE: PSA: 2.28 ng/mL (ref 0.10–4.00)

## 2024-01-27 LAB — TSH: TSH: 2.71 u[IU]/mL (ref 0.35–5.50)

## 2024-01-27 LAB — HEMOGLOBIN A1C: Hgb A1c MFr Bld: 5.8 % (ref 4.6–6.5)

## 2024-01-27 NOTE — Assessment & Plan Note (Signed)
 On lovastatin .  Low cholesterol diet and exercise.  Follow lipid panel and liver function tests.  Check fasting labs today.

## 2024-01-27 NOTE — Assessment & Plan Note (Signed)
 Low-carb diet and exercise.  Follow met b and A1c.

## 2024-01-27 NOTE — Assessment & Plan Note (Signed)
 Saw cardiology.  Echo and zio monitor as outlined previously.  No symptoms. Stable. Continue risk factor modification.

## 2024-01-27 NOTE — Assessment & Plan Note (Signed)
 Blood pressure on outside checks have been under reasonable control as outlined.  Continue lisinopril .  Follow metabolic panel. Recheck by me today 128/78. Check metabolic panel today.

## 2024-01-27 NOTE — Assessment & Plan Note (Signed)
 Physical today 01/27/24.  Colonoscopy 06/06/20 - one 4mm polyp (transverse colon), one 2mm polyp in the sigmoid colon and one 3mm polyp in the sigmoid colon, diverticulosis.  Check psa today.

## 2024-01-27 NOTE — Assessment & Plan Note (Signed)
Followed by rheumatology.  Doing well on allopurinol.

## 2024-01-27 NOTE — Assessment & Plan Note (Signed)
 Has been seeing ortho and PT for right shoulder pain. His MRI was obtained on 01/08/2024 and demonstrated overall moderate appearing tendinosis to the supraspinatus and infraspinatus with an area of possible full-thickness tearing to the supraspinatus tendon as well as interstitial tearing throughout the tendon. S/p injection 01/06/24. Therapy is going well. Shoulder is doing some better. Doing exercise at home.

## 2024-01-27 NOTE — Assessment & Plan Note (Signed)
 Seeing hematology. Cbc being followed by hematology.

## 2024-01-27 NOTE — Assessment & Plan Note (Signed)
 Has seen [facility].  Continue risk factor modification.  Stable.

## 2024-01-27 NOTE — Assessment & Plan Note (Signed)
 Has been followed previously by [facility].  Has been stable.  Continue statin.

## 2024-01-27 NOTE — Assessment & Plan Note (Signed)
 Seeing hematology. Last vist, 08/2023 - recommended to continue to avoid iron supplementation. Recommended to continue to follow cbc and iron studies. Also recommended to start B12 1000mcg every other day.

## 2024-01-28 DIAGNOSIS — M249 Joint derangement, unspecified: Secondary | ICD-10-CM | POA: Diagnosis not present

## 2024-01-28 DIAGNOSIS — M25811 Other specified joint disorders, right shoulder: Secondary | ICD-10-CM | POA: Diagnosis not present

## 2024-01-28 DIAGNOSIS — M75101 Unspecified rotator cuff tear or rupture of right shoulder, not specified as traumatic: Secondary | ICD-10-CM | POA: Diagnosis not present

## 2024-01-29 NOTE — Assessment & Plan Note (Signed)
 Addendum - received report regarding colonoscopy 05/2020. Recommended f/u in 3 years. Discussed with Charles Nichols. Agreeable for referral. Order placed for referral to Dr Terese Fendt.

## 2024-01-29 NOTE — Addendum Note (Signed)
 Addended by: Raejean Bullock on: 01/29/2024 12:14 PM   Modules accepted: Orders

## 2024-02-03 DIAGNOSIS — M25811 Other specified joint disorders, right shoulder: Secondary | ICD-10-CM | POA: Diagnosis not present

## 2024-02-03 DIAGNOSIS — M249 Joint derangement, unspecified: Secondary | ICD-10-CM | POA: Diagnosis not present

## 2024-02-03 DIAGNOSIS — M75101 Unspecified rotator cuff tear or rupture of right shoulder, not specified as traumatic: Secondary | ICD-10-CM | POA: Diagnosis not present

## 2024-02-04 ENCOUNTER — Ambulatory Visit: Payer: Medicare Other | Admitting: Internal Medicine

## 2024-02-06 DIAGNOSIS — M67911 Unspecified disorder of synovium and tendon, right shoulder: Secondary | ICD-10-CM

## 2024-02-06 HISTORY — DX: Unspecified disorder of synovium and tendon, right shoulder: M67.911

## 2024-02-07 DIAGNOSIS — M75101 Unspecified rotator cuff tear or rupture of right shoulder, not specified as traumatic: Secondary | ICD-10-CM | POA: Diagnosis not present

## 2024-02-07 DIAGNOSIS — M25811 Other specified joint disorders, right shoulder: Secondary | ICD-10-CM | POA: Diagnosis not present

## 2024-02-07 DIAGNOSIS — M249 Joint derangement, unspecified: Secondary | ICD-10-CM | POA: Diagnosis not present

## 2024-02-12 DIAGNOSIS — M25811 Other specified joint disorders, right shoulder: Secondary | ICD-10-CM | POA: Diagnosis not present

## 2024-02-12 DIAGNOSIS — M75101 Unspecified rotator cuff tear or rupture of right shoulder, not specified as traumatic: Secondary | ICD-10-CM | POA: Diagnosis not present

## 2024-02-12 DIAGNOSIS — M249 Joint derangement, unspecified: Secondary | ICD-10-CM | POA: Diagnosis not present

## 2024-02-17 DIAGNOSIS — M75101 Unspecified rotator cuff tear or rupture of right shoulder, not specified as traumatic: Secondary | ICD-10-CM | POA: Diagnosis not present

## 2024-02-17 DIAGNOSIS — G8929 Other chronic pain: Secondary | ICD-10-CM | POA: Diagnosis not present

## 2024-02-17 DIAGNOSIS — M75102 Unspecified rotator cuff tear or rupture of left shoulder, not specified as traumatic: Secondary | ICD-10-CM | POA: Diagnosis not present

## 2024-02-18 DIAGNOSIS — K080 Exfoliation of teeth due to systemic causes: Secondary | ICD-10-CM | POA: Diagnosis not present

## 2024-03-05 DIAGNOSIS — Z860101 Personal history of adenomatous and serrated colon polyps: Secondary | ICD-10-CM | POA: Diagnosis not present

## 2024-03-05 DIAGNOSIS — Z1211 Encounter for screening for malignant neoplasm of colon: Secondary | ICD-10-CM | POA: Diagnosis not present

## 2024-03-10 DIAGNOSIS — L663 Perifolliculitis capitis abscedens: Secondary | ICD-10-CM | POA: Diagnosis not present

## 2024-03-12 DIAGNOSIS — K080 Exfoliation of teeth due to systemic causes: Secondary | ICD-10-CM | POA: Diagnosis not present

## 2024-04-01 ENCOUNTER — Telehealth: Payer: Self-pay

## 2024-04-01 NOTE — Telephone Encounter (Signed)
 Patient is aware that he will need to contact his GI office doing the procedure for instructions.

## 2024-04-01 NOTE — Telephone Encounter (Unsigned)
 Copied from CRM [id number]. Topic: Clinical - Medical Advice >> Apr 01, 2024  9:55 AM Maisie BROCKS wrote: Reason for CRM: pt requested for Dr. Freda nurse to give him a callback to discuss what medications he can/can't take before his colonoscopy. Please advise at [phone] .

## 2024-04-03 DIAGNOSIS — K219 Gastro-esophageal reflux disease without esophagitis: Secondary | ICD-10-CM | POA: Diagnosis not present

## 2024-04-03 DIAGNOSIS — K760 Fatty (change of) liver, not elsewhere classified: Secondary | ICD-10-CM | POA: Diagnosis not present

## 2024-04-03 DIAGNOSIS — I2510 Atherosclerotic heart disease of native coronary artery without angina pectoris: Secondary | ICD-10-CM | POA: Diagnosis not present

## 2024-04-03 DIAGNOSIS — Z1211 Encounter for screening for malignant neoplasm of colon: Secondary | ICD-10-CM | POA: Diagnosis not present

## 2024-04-03 DIAGNOSIS — Z860101 Personal history of adenomatous and serrated colon polyps: Secondary | ICD-10-CM | POA: Diagnosis not present

## 2024-04-03 DIAGNOSIS — Z87891 Personal history of nicotine dependence: Secondary | ICD-10-CM | POA: Diagnosis not present

## 2024-04-03 DIAGNOSIS — K5730 Diverticulosis of large intestine without perforation or abscess without bleeding: Secondary | ICD-10-CM | POA: Diagnosis not present

## 2024-04-03 DIAGNOSIS — Z79899 Other long term (current) drug therapy: Secondary | ICD-10-CM | POA: Diagnosis not present

## 2024-04-03 DIAGNOSIS — I10 Essential (primary) hypertension: Secondary | ICD-10-CM | POA: Diagnosis not present

## 2024-04-03 DIAGNOSIS — Z7982 Long term (current) use of aspirin: Secondary | ICD-10-CM | POA: Diagnosis not present

## 2024-04-03 DIAGNOSIS — K635 Polyp of colon: Secondary | ICD-10-CM | POA: Diagnosis not present

## 2024-04-03 DIAGNOSIS — M109 Gout, unspecified: Secondary | ICD-10-CM | POA: Diagnosis not present

## 2024-04-03 DIAGNOSIS — E785 Hyperlipidemia, unspecified: Secondary | ICD-10-CM | POA: Diagnosis not present

## 2024-04-03 DIAGNOSIS — I739 Peripheral vascular disease, unspecified: Secondary | ICD-10-CM | POA: Diagnosis not present

## 2024-04-15 ENCOUNTER — Ambulatory Visit: Payer: Medicare Other | Admitting: *Deleted

## 2024-04-15 VITALS — Ht 75.0 in | Wt 254.0 lb

## 2024-04-15 DIAGNOSIS — Z0000 Encounter for general adult medical examination without abnormal findings: Secondary | ICD-10-CM

## 2024-04-15 NOTE — Patient Instructions (Signed)
 Mr. Lingafelter , Thank you for taking time out of your busy schedule to complete your Annual Wellness Visit with me. I enjoyed our conversation and look forward to speaking with you again next year. I, as well as your care team,  appreciate your ongoing commitment to your health goals. Please review the following plan we discussed and let me know if I can assist you in the future. Your Game plan/ To Do List    Referrals: If you haven't heard from the office you've been referred to, please reach out to them at the phone provided.  Remember to update your flu and covid vaccines annually Follow up Visits: Next Medicare AWV with our clinical staff: 04/21/25 @ 11:30   Have you seen your provider in the last 6 months (3 months if uncontrolled diabetes)? Yes Next Office Visit with your provider: 07/28/24  Clinician Recommendations:  Aim for 30 minutes of exercise or brisk walking, 6-8 glasses of water, and 5 servings of fruits and vegetables each day.       This is a list of the screening recommended for you and due dates:  Health Maintenance  Topic Date Due   COVID-19 Vaccine (7 - Moderna risk 2024-25 season) 02/06/2024   Flu Shot  05/08/2024   Medicare Annual Wellness Visit  04/15/2025   DTaP/Tdap/Td vaccine (2 - Td or Tdap) 04/02/2028   Colon Cancer Screening  04/03/2029   Pneumococcal Vaccine for age over 22  Completed   Hepatitis C Screening  Completed   Zoster (Shingles) Vaccine  Completed   Hepatitis B Vaccine  Aged Out   HPV Vaccine  Aged Out   Meningitis B Vaccine  Aged Out    Advanced directives: (Declined) Advance directive discussed with you today. Even though you declined this today, please call our office should you change your mind, and we can give you the proper paperwork for you to fill out. Advance Care Planning is important because it:  [x]  Makes sure you receive the medical care that is consistent with your values, goals, and preferences  [x]  It provides guidance to your  family and loved ones and reduces their decisional burden about whether or not they are making the right decisions based on your wishes.

## 2024-04-15 NOTE — Progress Notes (Signed)
 Subjective:   Charles Nichols is a 73 y.o. who presents for a Medicare Wellness preventive visit.  As a reminder, Annual Wellness Visits don't include a physical exam, and some assessments may be limited, especially if this visit is performed virtually. We may recommend an in-person follow-up visit with your provider if needed.  Visit Complete: Virtual I connected with  Brenden H Sonoda on 04/15/24 by a audio enabled telemedicine application and verified that I am speaking with the correct person using two identifiers.  Patient Location: Home  Provider Location: Home Office  I discussed the limitations of evaluation and management by telemedicine. The patient expressed understanding and agreed to proceed.  Vital Signs: Because this visit was a virtual/telehealth visit, some criteria may be missing or patient reported. Any vitals not documented were not able to be obtained and vitals that have been documented are patient reported.  VideoDeclined- This patient declined Librarian, academic. Therefore the visit was completed with audio only.  Persons Participating in Visit: Patient.  AWV Questionnaire: No: Patient Medicare AWV questionnaire was not completed prior to this visit.  Cardiac Risk Factors include: advanced age (>62men, >39 women);male gender;hypertension;dyslipidemia;obesity (BMI >30kg/m2);Other (see comment), Risk factor comments: CAD     Objective:    Today's Vitals   04/15/24 1125  Weight: 254 lb (115.2 kg)  Height: 6' 3 (1.905 m)   Body mass index is 31.75 kg/m.     04/15/2024   11:42 AM 08/22/2023   10:03 AM 04/15/2023    2:07 PM 08/23/2022   10:06 AM 04/04/2022    1:17 PM 02/20/2022    8:11 AM 01/26/2022   10:18 AM  Advanced Directives  Does Patient Have a Medical Advance Directive? No No No No No No No  Would patient like information on creating a medical advance directive? No - Patient declined No - Patient declined No - Patient  declined No - Patient declined No - Patient declined No - Patient declined No - Patient declined    Current Medications (verified) Outpatient Encounter Medications as of 04/15/2024  Medication Sig   acetaminophen (TYLENOL) 650 MG CR tablet Take 650 mg by mouth every 8 (eight) hours as needed for pain.   allopurinol  (ZYLOPRIM ) 300 MG tablet Take 1 tablet (300 mg total) by mouth daily.   aspirin 81 MG tablet Take 81 mg by mouth daily.   chlorthalidone  (HYGROTON ) 25 MG tablet Take 1 tablet (25 mg total) by mouth daily.   Cholecalciferol (VITAMIN D3) 2000 UNITS TABS Take 1 tablet by mouth daily.   clindamycin (CLEOCIN T) 1 % lotion Apply topically daily.   cyanocobalamin  (VITAMIN B12) 1000 MCG tablet Take 1,000 mcg by mouth every other day.   doxycycline  (ADOXA) 100 MG tablet Take 100 mg by mouth daily.   felodipine  (PLENDIL ) 10 MG 24 hr tablet Take 1 tablet (10 mg total) by mouth daily.   lisinopril  (ZESTRIL ) 20 MG tablet Take 2 tablets (40 mg total) by mouth daily.   lovastatin  (MEVACOR ) 40 MG tablet Take 1 tablet (40 mg total) by mouth daily.   naproxen (NAPROSYN) 500 MG tablet Take 500 mg by mouth 2 (two) times daily. (Patient taking differently: Take 500 mg by mouth 2 (two) times daily as needed.)   clobetasol  cream (TEMOVATE ) 0.05 % Apply 1 Application topically 2 (two) times daily. (Patient not taking: Reported on 04/15/2024)   triamcinolone  cream (KENALOG) 0.1 % APPLY TO ITCHY AREAS ON ARMS AND SCALP DAILY (Patient not taking: Reported  on 04/15/2024)   No facility-administered encounter medications on file as of 04/15/2024.    Allergies (verified) Patient has no known allergies.   History: Past Medical History:  Diagnosis Date   Arthritis    Elevated ferritin    Negative work-up for hemochromatosis   History of chicken pox    Hypertension    Obesity    Peripheral vascular disease (HCC)    Past Surgical History:  Procedure Laterality Date   CIRCUMCISION     COLONOSCOPY WITH  PROPOFOL  N/A 04/08/2015   Procedure: COLONOSCOPY WITH PROPOFOL ;  Surgeon: Gladis RAYMOND Mariner, MD;  Location: [facility] ENDOSCOPY;  Service: Endoscopy;  Laterality: N/A;   ESOPHAGOGASTRODUODENOSCOPY N/A 04/08/2015   Procedure: ESOPHAGOGASTRODUODENOSCOPY (EGD);  Surgeon: Gladis RAYMOND Mariner, MD;  Location: [facility] ENDOSCOPY;  Service: Endoscopy;  Laterality: N/A;   FEMORAL-POPLITEAL BYPASS GRAFT Bilateral 1997, 2000   leg stent     pvd   Family History  Problem Relation Age of Onset   Breast cancer Sister    Hypertension Sister    Hypertension Brother    Breast cancer Daughter    Social History   Socioeconomic History   Marital status: Legally Separated    Spouse name: Not on file   Number of children: Not on file   Years of education: Not on file   Highest education level: Not on file  Occupational History   Not on file  Tobacco Use   Smoking status: Former    Current packs/day: 0.00    Types: Cigarettes    Quit date: 1997    Years since quitting: 28.5    Passive exposure: Never   Smokeless tobacco: Never  Vaping Use   Vaping status: Never Used  Substance and Sexual Activity   Alcohol use: Not Currently   Drug use: No   Sexual activity: Not on file  Other Topics Concern   Not on file  Social History Narrative   Not on file   Social Drivers of Health   Financial Resource Strain: Low Risk  (04/15/2024)   Overall Financial Resource Strain (CARDIA)    Difficulty of Paying Living Expenses: Not hard at all  Food Insecurity: No Food Insecurity (04/15/2024)   Hunger Vital Sign    Worried About Running Out of Food in the Last Year: Never true    Ran Out of Food in the Last Year: Never true  Transportation Needs: No Transportation Needs (04/15/2024)   PRAPARE - Administrator, Civil Service (Medical): No    Lack of Transportation (Non-Medical): No  Physical Activity: Insufficiently Active (04/15/2024)   Exercise Vital Sign    Days of Exercise per Week: 4 days    Minutes of  Exercise per Session: 20 min  Stress: No Stress Concern Present (04/15/2024)   [employer] of Occupational Health - Occupational Stress Questionnaire    Feeling of Stress: Not at all  Social Connections: Moderately Isolated (04/15/2024)   Social Connection and Isolation Panel    Frequency of Communication with Friends and Family: More than three times a week    Frequency of Social Gatherings with Friends and Family: Twice a week    Attends Religious Services: More than 4 times per year    Active Member of [employer] or Organizations: No    Attends Banker Meetings: Never    Marital Status: Separated    Tobacco Counseling Counseling given: Not Answered    Clinical Intake:  Pre-visit preparation completed: Yes  Pain : No/denies pain  BMI - recorded: 31.75 Nutritional Status: BMI > 30  Obese Nutritional Risks: None Diabetes: No  Lab Results  Component Value Date   HGBA1C 5.8 01/27/2024   HGBA1C 5.7 (H) 09/11/2023   HGBA1C 5.8 05/09/2023     How often do you need to have someone help you when you read instructions, pamphlets, or other written materials from your doctor or pharmacy?: 1 - Never  Interpreter Needed?: No  Information entered by :: R. Jessey Stehlin LPN   Activities of Daily Living     04/15/2024   11:27 AM  In your present state of health, do you have any difficulty performing the following activities:  Hearing? 0  Comment no issues  Vision? 0  Comment glasses  Difficulty concentrating or making decisions? 0  Walking or climbing stairs? 1  Dressing or bathing? 0  Doing errands, shopping? 0  Preparing Food and eating ? N  Using the Toilet? N  In the past six months, have you accidently leaked urine? N  Do you have problems with loss of bowel control? N  Managing your Medications? N  Managing your Finances? N  Housekeeping or managing your Housekeeping? N    Patient Care Team: Glendia Shad, MD as PCP - General (Internal  Medicine) Debera Jayson MATSU, MD as PCP - Cardiology (Cardiology) Rogers Hai, MD as Medical Oncologist (Hematology)  I have updated your Care Teams any recent Medical Services you may have received from other providers in the past year.     Assessment:   This is a routine wellness examination for Makih.  Hearing/Vision screen Hearing Screening - Comments:: No issues Vision Screening - Comments:: glasses   Goals Addressed             This Visit's Progress    Patient Stated       Wants to get stronger and run a little        Depression Screen     04/15/2024   11:38 AM 04/15/2023    2:05 PM 12/20/2022    2:35 PM 09/18/2022    1:04 PM 05/22/2022    4:05 PM 04/04/2022    1:14 PM 03/19/2022   11:39 AM  PHQ 2/9 Scores  PHQ - 2 Score 0 0 0 0 0 0 0  PHQ- 9 Score 0  0        Fall Risk     04/15/2024   11:30 AM 04/15/2023    2:03 PM 12/20/2022    2:35 PM 09/18/2022    1:04 PM 05/22/2022    4:05 PM  Fall Risk   Falls in the past year? 0 0 0 0 0  Number falls in past yr: 0 0 0 0 0  Injury with Fall? 0 0 0 0 0  Risk for fall due to : No Fall Risks No Fall Risks No Fall Risks No Fall Risks No Fall Risks  Follow up Falls evaluation completed;Falls prevention discussed Falls prevention discussed Falls evaluation completed Falls evaluation completed  Falls evaluation completed      Data saved with a previous flowsheet row definition    MEDICARE RISK AT HOME:  Medicare Risk at Home Any stairs in or around the home?: Yes If so, are there any without handrails?: No Home free of loose throw rugs in walkways, pet beds, electrical cords, etc?: Yes Adequate lighting in your home to reduce risk of falls?: Yes Life alert?: No Use of a cane, walker or w/c?: Yes Grab bars in the bathroom?:  No Shower chair or bench in shower?: Yes Elevated toilet seat or a handicapped toilet?: No  TIMED UP AND GO:  Was the test performed?  No  Cognitive Function: 6CIT completed    03/27/2018     2:17 PM  MMSE - Mini Mental State Exam  Orientation to time 5  Orientation to Place 5  Registration 3  Attention/ Calculation 5  Recall 3  Language- name 2 objects 2  Language- repeat 1  Language- follow 3 step command 3  Language- read & follow direction 1  Write a sentence 1  Copy design 1  Total score 30        04/15/2024   11:43 AM 04/15/2023    2:07 PM 04/03/2021    1:18 PM 03/31/2020    1:01 PM 03/30/2019    2:18 PM  6CIT Screen  What Year? 0 points 0 points 0 points 0 points 0 points  What month? 0 points 0 points 0 points 0 points 0 points  What time? 0 points 0 points 0 points  0 points  Count back from 20 0 points 0 points 0 points  0 points  Months in reverse 0 points 0 points 0 points 0 points 0 points  Repeat phrase 0 points 0 points  0 points 0 points  Total Score 0 points 0 points   0 points    Immunizations Immunization History  Administered Date(s) Administered   Fluad Quad(high Dose 65+) 08/03/2019, 06/21/2020, 06/14/2021, 09/18/2022   Influenza Split 06/29/2013, 09/21/2014   Influenza, High Dose Seasonal PF 08/22/2016, 08/22/2017, 09/02/2018   Influenza-Unspecified 07/03/2015, 08/09/2023   Moderna Sars-Covid-2 Vaccination 11/19/2019, 12/21/2019, 10/15/2020, 03/29/2021   Pneumococcal Conjugate-13 04/12/2017   Pneumococcal Polysaccharide-23 12/03/2018   Tdap 04/02/2018   Zoster Recombinant(Shingrix) 09/14/2021, 01/13/2022   Zoster, Live 11/02/2014    Screening Tests Health Maintenance  Topic Date Due   COVID-19 Vaccine (5 - 2024-25 season) 06/09/2023   Medicare Annual Wellness (AWV)  04/14/2024   INFLUENZA VACCINE  05/08/2024   DTaP/Tdap/Td (2 - Td or Tdap) 04/02/2028   Colonoscopy  04/03/2029   Pneumococcal Vaccine: 50+ Years  Completed   Hepatitis C Screening  Completed   Zoster Vaccines- Shingrix  Completed   Hepatitis B Vaccines  Aged Out   HPV VACCINES  Aged Out   Meningococcal B Vaccine  Aged Out    Health Maintenance  Health  Maintenance Due  Topic Date Due   COVID-19 Vaccine (5 - 2024-25 season) 06/09/2023   Medicare Annual Wellness (AWV)  04/14/2024   Health Maintenance Items Addressed: Discussed the need to continue to update flu and covid vaccines annually  Additional Screening:  Vision Screening: Recommended annual ophthalmology exams for early detection of glaucoma and other disorders of the eye.  Up to date My [facility] Would you like a referral to an eye doctor? No    Dental Screening: Recommended annual dental exams for proper oral hygiene  Community Resource Referral / Chronic Care Management: CRR required this visit?  No   CCM required this visit?  No   Plan:    I have personally reviewed and noted the following in the patient's chart:   Medical and social history Use of alcohol, tobacco or illicit drugs  Current medications and supplements including opioid prescriptions. Patient is not currently taking opioid prescriptions. Functional ability and status Nutritional status Physical activity Advanced directives List of other physicians Hospitalizations, surgeries, and ER visits in previous 12 months Vitals Screenings to include cognitive, depression,  and falls Referrals and appointments  In addition, I have reviewed and discussed with patient certain preventive protocols, quality metrics, and best practice recommendations. A written personalized care plan for preventive services as well as general preventive health recommendations were provided to patient.   Angeline Fredericks, LPN   [id number]/7974   After Visit Summary: Pick up Due to this being a telephonic visit, the after visit summary with patients personalized plan was offered to patient.   Notes: Nothing significant to report at this time.

## 2024-05-07 DIAGNOSIS — L663 Perifolliculitis capitis abscedens: Secondary | ICD-10-CM | POA: Diagnosis not present

## 2024-05-21 DIAGNOSIS — M170 Bilateral primary osteoarthritis of knee: Secondary | ICD-10-CM | POA: Diagnosis not present

## 2024-05-21 DIAGNOSIS — M25561 Pain in right knee: Secondary | ICD-10-CM | POA: Diagnosis not present

## 2024-05-21 DIAGNOSIS — M25562 Pain in left knee: Secondary | ICD-10-CM | POA: Diagnosis not present

## 2024-05-26 DIAGNOSIS — K080 Exfoliation of teeth due to systemic causes: Secondary | ICD-10-CM | POA: Diagnosis not present

## 2024-06-18 DIAGNOSIS — L663 Perifolliculitis capitis abscedens: Secondary | ICD-10-CM | POA: Diagnosis not present
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
## 2024-07-28 ENCOUNTER — Encounter: Payer: Self-pay | Admitting: Internal Medicine

## 2024-07-28 ENCOUNTER — Ambulatory Visit: Admitting: Internal Medicine

## 2024-07-28 VITALS — BP 102/68 | HR 98 | Temp 97.5°F | Ht 75.0 in | Wt 244.0 lb

## 2024-07-28 DIAGNOSIS — Z23 Encounter for immunization: Secondary | ICD-10-CM

## 2024-07-28 DIAGNOSIS — R7989 Other specified abnormal findings of blood chemistry: Secondary | ICD-10-CM

## 2024-07-28 DIAGNOSIS — R739 Hyperglycemia, unspecified: Secondary | ICD-10-CM

## 2024-07-28 DIAGNOSIS — E7800 Pure hypercholesterolemia, unspecified: Secondary | ICD-10-CM

## 2024-07-28 DIAGNOSIS — I10 Essential (primary) hypertension: Secondary | ICD-10-CM | POA: Diagnosis not present

## 2024-07-28 DIAGNOSIS — I70213 Atherosclerosis of native arteries of extremities with intermittent claudication, bilateral legs: Secondary | ICD-10-CM

## 2024-07-28 DIAGNOSIS — I25118 Atherosclerotic heart disease of native coronary artery with other forms of angina pectoris: Secondary | ICD-10-CM

## 2024-07-28 DIAGNOSIS — M1A09X0 Idiopathic chronic gout, multiple sites, without tophus (tophi): Secondary | ICD-10-CM

## 2024-07-28 DIAGNOSIS — I739 Peripheral vascular disease, unspecified: Secondary | ICD-10-CM

## 2024-07-28 DIAGNOSIS — D649 Anemia, unspecified: Secondary | ICD-10-CM | POA: Diagnosis not present

## 2024-07-28 DIAGNOSIS — Z860100 Personal history of colon polyps, unspecified: Secondary | ICD-10-CM

## 2024-07-28 LAB — LIPID PANEL
Cholesterol: 115 mg/dL (ref 0–200)
HDL: 43.9 mg/dL (ref >39.00)
LDL Cholesterol: 61 mg/dL (ref 0–99)
NonHDL: 71.16
Total CHOL/HDL Ratio: 3
Triglycerides: 49 mg/dL (ref 0.0–149.0)
VLDL: 9.8 mg/dL (ref 0.0–40.0)

## 2024-07-28 LAB — HEPATIC FUNCTION PANEL
ALT: 13 U/L (ref 0–53)
AST: 17 U/L (ref 0–37)
Albumin: 4.3 g/dL (ref 3.5–5.2)
Alkaline Phosphatase: 61 U/L (ref 39–117)
Bilirubin, Direct: 0.1 mg/dL (ref 0.0–0.3)
Total Bilirubin: 0.4 mg/dL (ref 0.2–1.2)
Total Protein: 6.6 g/dL (ref 6.0–8.3)

## 2024-07-28 LAB — HEMOGLOBIN A1C: Hgb A1c MFr Bld: 5.8 % (ref 4.6–6.5)

## 2024-07-28 LAB — BASIC METABOLIC PANEL WITH GFR
BUN: 27 mg/dL — ABNORMAL HIGH (ref 6–23)
CO2: 26 meq/L (ref 19–32)
Calcium: 9.7 mg/dL (ref 8.4–10.5)
Chloride: 102 meq/L (ref 96–112)
Creatinine, Ser: 1.03 mg/dL (ref 0.40–1.50)
GFR: 72.13 mL/min (ref >60.00)
Glucose, Bld: 94 mg/dL (ref 70–99)
Potassium: 4 meq/L (ref 3.5–5.1)
Sodium: 137 meq/L (ref 135–145)

## 2024-07-28 LAB — VITAMIN B12: Vitamin B-12: 688 pg/mL (ref 211–911)

## 2024-07-28 MED ORDER — CHLORTHALIDONE 25 MG PO TABS: 25.0000 mg | ORAL_TABLET | Freq: Every day | ORAL | 3 refills | Status: AC

## 2024-07-28 MED ORDER — TADALAFIL 5 MG PO TABS: ORAL_TABLET | ORAL | 1 refills | Status: AC

## 2024-07-28 MED ORDER — FELODIPINE ER 10 MG PO TB24: 10.0000 mg | ORAL_TABLET | Freq: Every day | ORAL | 3 refills | Status: AC

## 2024-07-28 MED ORDER — LISINOPRIL 20 MG PO TABS: 40.0000 mg | ORAL_TABLET | Freq: Every day | ORAL | 3 refills | Status: AC

## 2024-07-28 MED ORDER — LOVASTATIN 40 MG PO TABS: 40.0000 mg | ORAL_TABLET | Freq: Every day | ORAL | 3 refills | Status: AC

## 2024-07-28 NOTE — Progress Notes (Signed)
 Subjective:    Patient ID: Charles Nichols, male    DOB: Dec 25, 1950, 73 y.o.   MRN: [id number]  Patient here for  Chief Complaint  Patient presents with   Hypertension   Medication Consultation    Patient would like to discuss restart Viagra with provider given all his health conditions. Patient states he was on the medication in the past.     HPI Here for a scheduled follow up - follow up regarding hypertension, hyperglycemia and hypercholesterolemia. Had f/u with ortho 05/21/24 - follow knee pain and shoulder pain. Had previously completed PT at [facility]. S/p cortisone injection right and left knee 05/21/24. Hleped. Prescribed naproxen. F/u 3 months. Has f/u planned next month. S/p bilateral shoulder injections (cortisone) 02/17/24. Some increased pain with abduction above 90 degrees. Colonoscopy 04/03/24 - diverticulosis, otherwise normal. Recommended f/u colonoscopy in 5 years. No chest pain or sob reported. No abdominal pain or bowel change reported. Legs stable. Request rx for cialis . Tolerated previously.    Past Medical History:  Diagnosis Date   Arthritis    Elevated ferritin    Negative work-up for hemochromatosis   History of chicken pox    Hypertension    Obesity    Peripheral vascular disease    Past Surgical History:  Procedure Laterality Date   CIRCUMCISION     COLONOSCOPY WITH PROPOFOL  N/A 04/08/2015   Procedure: COLONOSCOPY WITH PROPOFOL ;  Surgeon: Gladis RAYMOND Mariner, MD;  Location: [facility] ENDOSCOPY;  Service: Endoscopy;  Laterality: N/A;   ESOPHAGOGASTRODUODENOSCOPY N/A 04/08/2015   Procedure: ESOPHAGOGASTRODUODENOSCOPY (EGD);  Surgeon: Gladis RAYMOND Mariner, MD;  Location: [facility] ENDOSCOPY;  Service: Endoscopy;  Laterality: N/A;   FEMORAL-POPLITEAL BYPASS GRAFT Bilateral 1997, 2000   leg stent     pvd   Family History  Problem Relation Age of Onset   Breast cancer Sister    Hypertension Sister    Hypertension Brother    Breast cancer Daughter    Social History    Socioeconomic History   Marital status: Legally Separated    Spouse name: Not on file   Number of children: Not on file   Years of education: Not on file   Highest education level: Not on file  Occupational History   Not on file  Tobacco Use   Smoking status: Former    Current packs/day: 0.00    Types: Cigarettes    Quit date: 1997    Years since quitting: 28.8    Passive exposure: Never   Smokeless tobacco: Never  Vaping Use   Vaping status: Never Used  Substance and Sexual Activity   Alcohol use: Not Currently   Drug use: No   Sexual activity: Not on file  Other Topics Concern   Not on file  Social History Narrative   Not on file   Social Drivers of Health   Financial Resource Strain: Low Risk  (04/15/2024)   Overall Financial Resource Strain (CARDIA)    Difficulty of Paying Living Expenses: Not hard at all  Food Insecurity: No Food Insecurity (04/15/2024)   Hunger Vital Sign    Worried About Running Out of Food in the Last Year: Never true    Ran Out of Food in the Last Year: Never true  Transportation Needs: No Transportation Needs (04/15/2024)   PRAPARE - Administrator, Civil Service (Medical): No    Lack of Transportation (Non-Medical): No  Physical Activity: Insufficiently Active (04/15/2024)   Exercise Vital Sign    Days of Exercise  per Week: 4 days    Minutes of Exercise per Session: 20 min  Stress: No Stress Concern Present (04/15/2024)   [employer] of Occupational Health - Occupational Stress Questionnaire    Feeling of Stress: Not at all  Social Connections: Moderately Isolated (04/15/2024)   Social Connection and Isolation Panel    Frequency of Communication with Friends and Family: More than three times a week    Frequency of Social Gatherings with Friends and Family: Twice a week    Attends Religious Services: More than 4 times per year    Active Member of [employer] or Organizations: No    Attends Banker Meetings: Never     Marital Status: Separated     Review of Systems  Constitutional:  Negative for appetite change and unexpected weight change.  HENT:  Negative for congestion and sinus pressure.   Respiratory:  Negative for cough, chest tightness and shortness of breath.   Cardiovascular:  Negative for chest pain and palpitations.       No increased swelling.   Gastrointestinal:  Negative for abdominal pain, diarrhea, nausea and vomiting.  Genitourinary:  Negative for difficulty urinating and dysuria.  Musculoskeletal:  Negative for joint swelling and myalgias.  Skin:  Negative for color change and rash.  Neurological:  Negative for dizziness and headaches.  Psychiatric/Behavioral:  Negative for agitation and dysphoric mood.        Objective:     BP 102/68 (BP Location: Left Arm, Patient Position: Sitting, Cuff Size: Normal)   Pulse 98   Temp (!) 97.5 F (36.4 C) (Oral)   Ht 6' 3 (1.905 m)   Wt 244 lb (110.7 kg)   SpO2 98%   BMI 30.50 kg/m  Wt Readings from Last 3 Encounters:  07/28/24 244 lb (110.7 kg)  04/15/24 254 lb (115.2 kg)  01/27/24 254 lb (115.2 kg)    Physical Exam Constitutional:      General: He is not in acute distress.    Appearance: Normal appearance. He is well-developed.  HENT:     Head: Normocephalic and atraumatic.     Right Ear: External ear normal.     Left Ear: External ear normal.     Mouth/Throat:     Pharynx: No oropharyngeal exudate or posterior oropharyngeal erythema.  Eyes:     General: No scleral icterus.       Right eye: No discharge.        Left eye: No discharge.  Cardiovascular:     Rate and Rhythm: Normal rate and regular rhythm.  Pulmonary:     Effort: Pulmonary effort is normal. No respiratory distress.     Breath sounds: Normal breath sounds.  Abdominal:     General: Bowel sounds are normal.     Palpations: Abdomen is soft.     Tenderness: There is no abdominal tenderness.  Musculoskeletal:        General: No swelling or tenderness.      Cervical back: Neck supple. No tenderness.  Lymphadenopathy:     Cervical: No cervical adenopathy.  Skin:    Findings: No erythema or rash.  Neurological:     Mental Status: He is alert.  Psychiatric:        Mood and Affect: Mood normal.        Behavior: Behavior normal.         Outpatient Encounter Medications as of 07/28/2024  Medication Sig   acetaminophen (TYLENOL) 650 MG CR tablet Take 650 mg by  mouth every 8 (eight) hours as needed for pain.   allopurinol  (ZYLOPRIM ) 300 MG tablet Take 1 tablet (300 mg total) by mouth daily.   aspirin 81 MG tablet Take 81 mg by mouth daily.   Cholecalciferol (VITAMIN D3) 2000 UNITS TABS Take 1 tablet by mouth daily.   clindamycin (CLEOCIN T) 1 % lotion Apply topically daily.   cyanocobalamin  (VITAMIN B12) 1000 MCG tablet Take 1,000 mcg by mouth every other day.   doxycycline  (ADOXA) 100 MG tablet Take 100 mg by mouth daily.   naproxen (NAPROSYN) 500 MG tablet Take 500 mg by mouth 2 (two) times daily. (Patient taking differently: Take 500 mg by mouth 2 (two) times daily as needed.)   tadalafil  (CIALIS ) 5 MG tablet Q 72 hours prn.   chlorthalidone  (HYGROTON ) 25 MG tablet Take 1 tablet (25 mg total) by mouth daily.   clobetasol  cream (TEMOVATE ) 0.05 % Apply 1 Application topically 2 (two) times daily. (Patient not taking: Reported on 07/28/2024)   felodipine  (PLENDIL ) 10 MG 24 hr tablet Take 1 tablet (10 mg total) by mouth daily.   lisinopril  (ZESTRIL ) 20 MG tablet Take 2 tablets (40 mg total) by mouth daily.   lovastatin  (MEVACOR ) 40 MG tablet Take 1 tablet (40 mg total) by mouth daily.   [DISCONTINUED] chlorthalidone  (HYGROTON ) 25 MG tablet Take 1 tablet (25 mg total) by mouth daily.   [DISCONTINUED] felodipine  (PLENDIL ) 10 MG 24 hr tablet Take 1 tablet (10 mg total) by mouth daily.   [DISCONTINUED] lisinopril  (ZESTRIL ) 20 MG tablet Take 2 tablets (40 mg total) by mouth daily.   [DISCONTINUED] lovastatin  (MEVACOR ) 40 MG tablet Take 1 tablet  (40 mg total) by mouth daily.   [DISCONTINUED] triamcinolone  cream (KENALOG) 0.1 % APPLY TO ITCHY AREAS ON ARMS AND SCALP DAILY (Patient not taking: Reported on 04/15/2024)   No facility-administered encounter medications on file as of 07/28/2024.     Lab Results  Component Value Date   WBC 7.6 09/11/2023   HGB 12.3 (L) 09/11/2023   HCT 36.8 (L) 09/11/2023   PLT 207 09/11/2023   GLUCOSE 94 07/28/2024   CHOL 115 07/28/2024   TRIG 49.0 07/28/2024   HDL 43.90 07/28/2024   LDLDIRECT 78.0 06/14/2021   LDLCALC 61 07/28/2024   ALT 13 07/28/2024   AST 17 07/28/2024   NA 137 07/28/2024   K 4.0 07/28/2024   CL 102 07/28/2024   CREATININE 1.03 07/28/2024   BUN 27 (H) 07/28/2024   CO2 26 07/28/2024   TSH 2.71 01/27/2024   PSA 2.28 01/27/2024   HGBA1C 5.8 07/28/2024       Assessment & Plan:  Hyperglycemia Assessment & Plan: Low carb diet and exercise. Follow met b and A1c.   Orders: -     Hemoglobin A1c  Essential hypertension, benign -     Chlorthalidone ; Take 1 tablet (25 mg total) by mouth daily.  Dispense: 90 tablet; Refill: 3  Hypercholesterolemia Assessment & Plan: On lovastatin .  Low cholesterol diet and exercise.  Follow lipid panel and liver function tests.  Check lipid panel today.   Orders: -     Hepatic function panel -     Lipid panel  Benign essential hypertension Assessment & Plan: Continue lisinopril .  Follow metabolic panel. Recheck by me today 120/68. Follow pressures. Check metabolic panel.   Orders: -     Basic metabolic panel with GFR  Elevated ferritin Assessment & Plan: Seeing hematology. Recommended to continue to avoid iron supplementation. Recommended to continue to  follow cbc and iron studies. Also recommended to start B12 1000mcg every other day. Continue f/u with hematology.   Orders: -     Vitamin B12  Normocytic anemia -     Vitamin B12  Elevated vitamin B12 level -     Vitamin B12  Needs flu shot -     Flu vaccine HIGH DOSE  PF(Fluzone Trivalent)  Anemia, unspecified type Assessment & Plan: Seeing hematology. Has been following cbc. Has f/u scheduled soon.    Atherosclerosis of native artery of both lower extremities with intermittent claudication Assessment & Plan: Has been followed previously by [facility].  Has been stable.  Continue risk factor medication. Continue mevacor .    Coronary artery disease of native artery of native heart with stable angina pectoris Assessment & Plan: Saw cardiology.  Echo and zio monitor as outlined previously.  No symptoms. Stable. Continue risk factor modification.    History of colonic polyps Assessment & Plan: Colonoscopy 04/03/24 - diverticulosis. Repeat colonoscopy in 5 years.    Idiopathic chronic gout of multiple sites without tophus Assessment & Plan: Followed by rheumatology. Has done well on allopurinol . Follow.    Peripheral vascular disease Assessment & Plan: Has seen [facility].  Continue risk factor modification.  Stable.     Other orders -     Felodipine  ER; Take 1 tablet (10 mg total) by mouth daily.  Dispense: 90 tablet; Refill: 3 -     Lisinopril ; Take 2 tablets (40 mg total) by mouth daily.  Dispense: 180 tablet; Refill: 3 -     Lovastatin ; Take 1 tablet (40 mg total) by mouth daily.  Dispense: 90 tablet; Refill: 3 -     Tadalafil ; Q 72 hours prn.  Dispense: 15 tablet; Refill: 1     Allena Hamilton, MD

## 2024-07-29 ENCOUNTER — Ambulatory Visit: Payer: Self-pay | Admitting: Internal Medicine

## 2024-08-02 ENCOUNTER — Encounter: Payer: Self-pay | Admitting: Internal Medicine

## 2024-08-02 NOTE — Assessment & Plan Note (Signed)
 Seeing hematology. Has been following cbc. Has f/u scheduled soon.

## 2024-08-02 NOTE — Assessment & Plan Note (Signed)
 Colonoscopy 04/03/24 - diverticulosis. Repeat colonoscopy in 5 years.

## 2024-08-02 NOTE — Assessment & Plan Note (Signed)
 Low-carb diet and exercise.  Follow met b and A1c.

## 2024-08-02 NOTE — Assessment & Plan Note (Signed)
 Continue lisinopril .  Follow metabolic panel. Recheck by me today 120/68. Follow pressures. Check metabolic panel.

## 2024-08-02 NOTE — Assessment & Plan Note (Signed)
 Followed by rheumatology. Has done well on allopurinol . Follow.

## 2024-08-02 NOTE — Assessment & Plan Note (Signed)
 Seeing hematology. Recommended to continue to avoid iron supplementation. Recommended to continue to follow cbc and iron studies. Also recommended to start B12 1000mcg every other day. Continue f/u with hematology.

## 2024-08-02 NOTE — Assessment & Plan Note (Signed)
 Has seen [facility].  Continue risk factor modification.  Stable.

## 2024-08-02 NOTE — Assessment & Plan Note (Signed)
 Has been followed previously by [facility].  Has been stable.  Continue risk factor medication. Continue mevacor .

## 2024-08-02 NOTE — Assessment & Plan Note (Signed)
 On lovastatin .  Low cholesterol diet and exercise.  Follow lipid panel and liver function tests.  Check lipid panel today.

## 2024-08-02 NOTE — Assessment & Plan Note (Signed)
 Saw cardiology.  Echo and zio monitor as outlined previously.  No symptoms. Stable. Continue risk factor modification.

## 2024-08-03 ENCOUNTER — Ambulatory Visit: Payer: Medicare Other | Attending: Internal Medicine | Admitting: Internal Medicine

## 2024-08-03 ENCOUNTER — Encounter: Payer: Self-pay | Admitting: Internal Medicine

## 2024-08-03 VITALS — BP 125/65 | HR 86 | Temp 97.2°F | Resp 16 | Ht 75.0 in | Wt 247.2 lb

## 2024-08-03 DIAGNOSIS — M25512 Pain in left shoulder: Secondary | ICD-10-CM

## 2024-08-03 DIAGNOSIS — Z5181 Encounter for therapeutic drug level monitoring: Secondary | ICD-10-CM

## 2024-08-03 DIAGNOSIS — M25511 Pain in right shoulder: Secondary | ICD-10-CM

## 2024-08-03 DIAGNOSIS — M1A09X0 Idiopathic chronic gout, multiple sites, without tophus (tophi): Secondary | ICD-10-CM | POA: Diagnosis not present

## 2024-08-04 LAB — URIC ACID: Uric Acid, Serum: 6 mg/dL (ref 4.0–8.0)

## 2024-08-13 ENCOUNTER — Other Ambulatory Visit: Payer: Self-pay

## 2024-08-13 DIAGNOSIS — D649 Anemia, unspecified: Secondary | ICD-10-CM

## 2024-08-13 DIAGNOSIS — R7989 Other specified abnormal findings of blood chemistry: Secondary | ICD-10-CM

## 2024-08-14 ENCOUNTER — Inpatient Hospital Stay: Payer: Medicare Other | Attending: Oncology

## 2024-08-14 DIAGNOSIS — Z79899 Other long term (current) drug therapy: Secondary | ICD-10-CM | POA: Insufficient documentation

## 2024-08-14 DIAGNOSIS — Z87891 Personal history of nicotine dependence: Secondary | ICD-10-CM | POA: Diagnosis not present

## 2024-08-14 DIAGNOSIS — R778 Other specified abnormalities of plasma proteins: Secondary | ICD-10-CM | POA: Insufficient documentation

## 2024-08-14 DIAGNOSIS — Z803 Family history of malignant neoplasm of breast: Secondary | ICD-10-CM | POA: Insufficient documentation

## 2024-08-14 DIAGNOSIS — M109 Gout, unspecified: Secondary | ICD-10-CM | POA: Diagnosis not present

## 2024-08-14 DIAGNOSIS — R7989 Other specified abnormal findings of blood chemistry: Secondary | ICD-10-CM

## 2024-08-14 DIAGNOSIS — D649 Anemia, unspecified: Secondary | ICD-10-CM | POA: Insufficient documentation

## 2024-08-14 LAB — COMPREHENSIVE METABOLIC PANEL WITH GFR
ALT: 15 U/L (ref 0–44)
AST: 22 U/L (ref 15–41)
Albumin: 4.4 g/dL (ref 3.5–5.0)
Alkaline Phosphatase: 81 U/L (ref 38–126)
Anion gap: 11 (ref 5–15)
BUN: 23 mg/dL (ref 8–23)
CO2: 27 mmol/L (ref 22–32)
Calcium: 10 mg/dL (ref 8.9–10.3)
Chloride: 99 mmol/L (ref 98–111)
Creatinine, Ser: 1.06 mg/dL (ref 0.61–1.24)
GFR, Estimated: >60 mL/min (ref >60)
Glucose, Bld: 99 mg/dL (ref 70–99)
Potassium: 3.8 mmol/L (ref 3.5–5.1)
Sodium: 136 mmol/L (ref 135–145)
Total Bilirubin: 0.4 mg/dL (ref 0.0–1.2)
Total Protein: 7.5 g/dL (ref 6.5–8.1)

## 2024-08-14 LAB — CBC WITH DIFFERENTIAL/PLATELET
Abs Immature Granulocytes: 0.04 K/uL (ref 0.00–0.07)
Basophils Absolute: 0 K/uL (ref 0.0–0.1)
Basophils Relative: 0 %
Eosinophils Absolute: 0.2 K/uL (ref 0.0–0.5)
Eosinophils Relative: 2 %
HCT: 38.2 % — ABNORMAL LOW (ref 39.0–52.0)
Hemoglobin: 12.7 g/dL — ABNORMAL LOW (ref 13.0–17.0)
Immature Granulocytes: 1 %
Lymphocytes Relative: 30 %
Lymphs Abs: 2.1 K/uL (ref 0.7–4.0)
MCH: 26.9 pg (ref 26.0–34.0)
MCHC: 33.2 g/dL (ref 30.0–36.0)
MCV: 80.9 fL (ref 80.0–100.0)
Monocytes Absolute: 0.8 K/uL (ref 0.1–1.0)
Monocytes Relative: 11 %
Neutro Abs: 3.9 K/uL (ref 1.7–7.7)
Neutrophils Relative %: 56 %
Platelets: 189 K/uL (ref 150–400)
RBC: 4.72 MIL/uL (ref 4.22–5.81)
RDW: 13.8 % (ref 11.5–15.5)
WBC: 7 K/uL (ref 4.0–10.5)
nRBC: 0 % (ref 0.0–0.2)

## 2024-08-14 LAB — FERRITIN: Ferritin: 414 ng/mL — ABNORMAL HIGH (ref 24–336)

## 2024-08-14 LAB — LACTATE DEHYDROGENASE: LDH: 127 U/L (ref 98–192)

## 2024-08-14 LAB — IRON AND TIBC
Iron: 60 ug/dL (ref 45–182)
Saturation Ratios: 21 % (ref 17.9–39.5)
TIBC: 284 ug/dL (ref 250–450)
UIBC: 224 ug/dL

## 2024-08-14 LAB — VITAMIN B12: Vitamin B-12: 955 pg/mL — ABNORMAL HIGH (ref 180–914)

## 2024-08-17 DIAGNOSIS — M79671 Pain in right foot: Secondary | ICD-10-CM | POA: Diagnosis not present

## 2024-08-17 DIAGNOSIS — G5750 Tarsal tunnel syndrome, unspecified lower limb: Secondary | ICD-10-CM | POA: Diagnosis not present

## 2024-08-17 DIAGNOSIS — M25579 Pain in unspecified ankle and joints of unspecified foot: Secondary | ICD-10-CM | POA: Diagnosis not present

## 2024-08-17 DIAGNOSIS — M79672 Pain in left foot: Secondary | ICD-10-CM | POA: Diagnosis not present

## 2024-08-21 ENCOUNTER — Inpatient Hospital Stay: Payer: Medicare Other | Admitting: Oncology

## 2024-08-21 DIAGNOSIS — R7989 Other specified abnormal findings of blood chemistry: Secondary | ICD-10-CM

## 2024-08-21 DIAGNOSIS — R778 Other specified abnormalities of plasma proteins: Secondary | ICD-10-CM | POA: Diagnosis not present

## 2024-08-21 DIAGNOSIS — D649 Anemia, unspecified: Secondary | ICD-10-CM

## 2024-08-21 DIAGNOSIS — Z803 Family history of malignant neoplasm of breast: Secondary | ICD-10-CM | POA: Diagnosis not present

## 2024-08-21 DIAGNOSIS — Z87891 Personal history of nicotine dependence: Secondary | ICD-10-CM | POA: Diagnosis not present

## 2024-08-21 DIAGNOSIS — M109 Gout, unspecified: Secondary | ICD-10-CM | POA: Diagnosis not present

## 2024-08-21 DIAGNOSIS — Z79899 Other long term (current) drug therapy: Secondary | ICD-10-CM | POA: Diagnosis not present

## 2024-08-21 NOTE — Progress Notes (Signed)
 [facility] [address], [place] [address]   CLINIC:  Medical Oncology/Hematology  PCP:  Glendia Shad, MD [address] [place] [address] [phone]   REASON FOR VISIT:  Follow-up for elevated ferritin and normocytic anemia  PRIOR THERAPY: None  CURRENT THERAPY: Surveillance  INTERVAL HISTORY:  Charles Nichols 72 y.o. male returns for routine follow-up of his elevated ferritin.   At today's visit, he reports feeling well.    Reports being diagnosed with gout in July 2024.  He continues to follow with rheumatology and is currently on 300 mg allopurinol  daily.  Denies any major gout flare ups within the past 6 months to a year.  He underwent a colonoscopy in June, which was unremarkable. He plans to repeat it in five years. He received knee injections from the orthopedics clinic, which have been beneficial. However, he continues to experience shoulder discomfort, described as a nagging pain with certain movements, particularly when reaching up or back. He plans to follow up with his orthopedist next month regarding this issue.  States he will require surgery to his right rotator cuff at some point.  He denies any abdominal pain or abnormal fatigue.   He does not take any iron supplement at home.  Reports he no longer eats red meat or organ meats.  He currently is eating a lot of fish and chicken.  He was given a dietary sheet to decrease risk for gout flares which she is trying to abide by.  He has 85% energy and 100% appetite. He endorses that he continues to lose weight due to increased physical activity, exercise and eating healthier.  Reports he weighed 325 pounds at his heaviest.  He weighed 247 pounds.  He uses a cane for when he has to walk far distances but otherwise is steady on his feet.  Denies any bright red blood per rectum, melena or hematochezia.  Overall is feeling well.  REVIEW OF SYSTEMS:  Review of Systems   Musculoskeletal:  Positive for arthralgias (From gout).      PAST MEDICAL/SURGICAL HISTORY:  Past Medical History:  Diagnosis Date   Arthritis    Elevated ferritin    Negative work-up for hemochromatosis   History of chicken pox    Hypertension    Obesity    Peripheral vascular disease    Rotator cuff disorder, right 02/2024   Past Surgical History:  Procedure Laterality Date   CIRCUMCISION     COLONOSCOPY WITH PROPOFOL  N/A 04/08/2015   Procedure: COLONOSCOPY WITH PROPOFOL ;  Surgeon: Gladis RAYMOND Mariner, MD;  Location: [facility] ENDOSCOPY;  Service: Endoscopy;  Laterality: N/A;   ESOPHAGOGASTRODUODENOSCOPY N/A 04/08/2015   Procedure: ESOPHAGOGASTRODUODENOSCOPY (EGD);  Surgeon: Gladis RAYMOND Mariner, MD;  Location: [facility] ENDOSCOPY;  Service: Endoscopy;  Laterality: N/A;   FEMORAL-POPLITEAL BYPASS GRAFT Bilateral 1997, 2000   leg stent     pvd     SOCIAL HISTORY:  Social History   Socioeconomic History   Marital status: Legally Separated    Spouse name: Not on file   Number of children: Not on file   Years of education: Not on file   Highest education level: Not on file  Occupational History   Not on file  Tobacco Use   Smoking status: Former    Current packs/day: 0.00    Types: Cigarettes    Quit date: 1997    Years since quitting: 28.8    Passive exposure: Never   Smokeless tobacco: Never  Vaping Use   Vaping status: Never Used  Substance and Sexual Activity   Alcohol use: Not Currently   Drug use: No   Sexual activity: Not on file  Other Topics Concern   Not on file  Social History Narrative   Not on file   Social Drivers of Health   Financial Resource Strain: Low Risk  (04/15/2024)   Overall Financial Resource Strain (CARDIA)    Difficulty of Paying Living Expenses: Not hard at all  Food Insecurity: No Food Insecurity (04/15/2024)   Hunger Vital Sign    Worried About Running Out of Food in the Last Year: Never true    Ran Out of Food in the Last Year: Never true   Transportation Needs: No Transportation Needs (04/15/2024)   PRAPARE - Administrator, Civil Service (Medical): No    Lack of Transportation (Non-Medical): No  Physical Activity: Insufficiently Active (04/15/2024)   Exercise Vital Sign    Days of Exercise per Week: 4 days    Minutes of Exercise per Session: 20 min  Stress: No Stress Concern Present (04/15/2024)   [employer] of Occupational Health - Occupational Stress Questionnaire    Feeling of Stress: Not at all  Social Connections: Moderately Isolated (04/15/2024)   Social Connection and Isolation Panel    Frequency of Communication with Friends and Family: More than three times a week    Frequency of Social Gatherings with Friends and Family: Twice a week    Attends Religious Services: More than 4 times per year    Active Member of [employer] or Organizations: No    Attends Banker Meetings: Never    Marital Status: Separated  Intimate Partner Violence: Not At Risk (04/15/2024)   Humiliation, Afraid, Rape, and Kick questionnaire    Fear of Current or Ex-Partner: No    Emotionally Abused: No    Physically Abused: No    Sexually Abused: No    FAMILY HISTORY:  Family History  Problem Relation Age of Onset   Breast cancer Sister    Hypertension Sister    Hypertension Brother    Breast cancer Daughter     CURRENT MEDICATIONS:  Outpatient Encounter Medications as of 08/21/2024  Medication Sig   acetaminophen (TYLENOL) 650 MG CR tablet Take 650 mg by mouth every 8 (eight) hours as needed for pain.   allopurinol  (ZYLOPRIM ) 300 MG tablet Take 1 tablet (300 mg total) by mouth daily.   aspirin 81 MG tablet Take 81 mg by mouth daily.   chlorthalidone  (HYGROTON ) 25 MG tablet Take 1 tablet (25 mg total) by mouth daily.   Cholecalciferol (VITAMIN D3) 2000 UNITS TABS Take 1 tablet by mouth daily.   clindamycin (CLEOCIN T) 1 % lotion Apply topically daily.   clobetasol  cream (TEMOVATE ) 0.05 % Apply 1 Application  topically 2 (two) times daily.   cyanocobalamin  (VITAMIN B12) 1000 MCG tablet Take 1,000 mcg by mouth every other day.   doxycycline  (ADOXA) 100 MG tablet Take 100 mg by mouth daily.   felodipine  (PLENDIL ) 10 MG 24 hr tablet Take 1 tablet (10 mg total) by mouth daily.   lisinopril  (ZESTRIL ) 20 MG tablet Take 2 tablets (40 mg total) by mouth daily.   lovastatin  (MEVACOR ) 40 MG tablet Take 1 tablet (40 mg total) by mouth daily.   naproxen (NAPROSYN) 500 MG tablet Take 500 mg by mouth 2 (two) times daily. (Patient taking differently: Take 500 mg by mouth 2 (two) times daily as needed.)  tadalafil  (CIALIS ) 5 MG tablet Q 72 hours prn.   No facility-administered encounter medications on file as of 08/21/2024.    ALLERGIES:  No Known Allergies   PHYSICAL EXAM:  ECOG PERFORMANCE STATUS: 0 - Asymptomatic  Vitals:   08/21/24 1021  BP: 112/68  Pulse: (!) 107  Resp: 16  Temp: 98.1 F (36.7 C)  SpO2: 99%    Filed Weights   08/21/24 1021  Weight: 247 lb 1.6 oz (112.1 kg)    Physical Exam Constitutional:      Appearance: Normal appearance. He is obese.  Cardiovascular:     Rate and Rhythm: Normal rate and regular rhythm.  Pulmonary:     Effort: Pulmonary effort is normal.     Breath sounds: Normal breath sounds.  Abdominal:     General: Bowel sounds are normal.     Palpations: Abdomen is soft.  Musculoskeletal:        General: No swelling. Normal range of motion.  Neurological:     Mental Status: He is alert and oriented to person, place, and time. Mental status is at baseline.      LABORATORY DATA:  I have reviewed the labs as listed.  CBC    Component Value Date/Time   WBC 7.0 08/14/2024 0944   RBC 4.72 08/14/2024 0944   HGB 12.7 (L) 08/14/2024 0944   HCT 38.2 (L) 08/14/2024 0944   PLT 189 08/14/2024 0944   MCV 80.9 08/14/2024 0944   MCH 26.9 08/14/2024 0944   MCHC 33.2 08/14/2024 0944   RDW 13.8 08/14/2024 0944   LYMPHSABS 2.1 08/14/2024 0944   MONOABS 0.8  08/14/2024 0944   EOSABS 0.2 08/14/2024 0944   BASOSABS 0.0 08/14/2024 0944      Latest Ref Rng & Units 08/14/2024    9:44 AM 07/28/2024    9:46 AM 01/27/2024   10:21 AM  CMP  Glucose 70 - 99 mg/dL 99  94  95   BUN 8 - 23 mg/dL 23  27  12    Creatinine 0.61 - 1.24 mg/dL 8.93  8.96  9.05   Sodium 135 - 145 mmol/L 136  137  135   Potassium 3.5 - 5.1 mmol/L 3.8  4.0  3.7   Chloride 98 - 111 mmol/L 99  102  99   CO2 22 - 32 mmol/L 27  26  27    Calcium  8.9 - 10.3 mg/dL 89.9  9.7  9.9   Total Protein 6.5 - 8.1 g/dL 7.5  6.6  6.7   Total Bilirubin 0.0 - 1.2 mg/dL 0.4  0.4  0.5   Alkaline Phos 38 - 126 U/L 81  61  66   AST 15 - 41 U/L 22  17  14    ALT 0 - 44 U/L 15  13  9      DIAGNOSTIC IMAGING:  I have independently reviewed the relevant imaging and discussed with the patient.  ASSESSMENT & PLAN: 1.  Elevated ferritin: - Patient evaluated for elevated ferritin since at least 2017 (range 540-840).  Percent saturation was within normal limits. - Patient reports that he was tested for elevated ferritin at [facility] 10 years ago. - He has arthritis of multiple joints.  No known liver problems.  No neuropathy other than right foot drop.  No prior transfusion history. - He was donating blood until 4 years ago.  - He does not take any iron supplements.  He previously drank occasional alcohol and frequently liver pudding, but he has cut these  out of his diet since May 2023. - Hematology work-up (01/26/2022):  Hemochromatosis mutational analysis negative Ferritin 495, iron saturation 24%.  Elevated vitamin B12 at 3427 may also be acute phase reactant. CBC with Hgb 11.6/MCV 81.8, otherwise unremarkable Elevated CRP 1.5, elevated ESR 75.  Normal rheumatoid factor and ANA.  Normal LDH. - Abdominal ultrasound (03/01/2022): Increased hepatic parenchymal echogenicity suggestive of steatosis - With ferritin <1000, he is unlikely to have any organ iron deposition.  2.  Elevated vitamin B12 - Labs from  April 2023 showed elevated B12 at 3427. - Patient stopped taking vitamin B12 supplements in May 2023  3.  Normocytic anemia: - He has mild normocytic anemia since 2015. - Reports that he was donating blood regularly up until 4 years ago.   - Colonoscopy in 2017 was unremarkable (diverticulosis in sigmoid colon). - He denies any bleeding per rectum or melena.  - Hematology work-up (01/26/2022):  Ferritin 495, iron saturation 24% CBC with Hgb 11.6/MCV 81.8, otherwise unremarkable Elevated CRP 1.5, elevated ESR 75.  Normal rheumatoid factor and ANA.  Normal LDH. Normal SPEP.  Normal reticulocytes. No apparent deficiency in copper , folate, B12, methylmalonic acid. Creatinine 1.16/GFR >60  4.  Social/family history: - He retired from [employer].  Exposure to dyes and acids.  Quit smoking 1997 after arterial bypass surgery on right leg.  Occasional alcohol intake. - No family history of hemochromatosis. - 2 sisters had breast cancer.  Daughter died of breast cancer.  PLAN: 1. Elevated ferritin - Most recent labs 08/14/2024 show iron saturation 21%, elevated ferritin 414 with normal LDH, LFTs and CMP. - Etiology thought to be secondary to fatty liver disease and acute phase reactant secondary to chronic inflammation (elevated ESR and CRP).  There is some low possibility that he may have hemochromatosis from undiscovered mutation. -Continue to avoid iron supplementation and cut back on iron rich foods such as liver pudding. - We will continue to monitor with repeat labs and RTC in 1 year.  2. Elevated vitamin B12 level - Most recent labs from 08/14/2024 showed elevated B12 955 with normal MMA. -We discussed continuing B12 supplements but reduce to 3 times per week.  3. Anemia, unspecified type - Most recent labs (08/14/2024) show hemoglobin of 12.7/hematocrit 38.2. - No clear etiology of anemia at this time.  May be anemia of chronic disease. - We will continue to monitor with repeat labs  and RTC in 1 year - If any significant deviations from baseline would consider bone marrow biopsy.  PLAN SUMMARY: PLAN SUMMARY: >> Continue B12 supplements every other day. >> Recommend follow-up in 1 year with labs a few days before.     All questions were answered. The patient knows to call the clinic with any problems, questions or concerns.  Medical decision making: Moderate  Time spent on visit: I spent 25 minutes dedicated to the care of this patient (face-to-face and non-face-to-face) on the date of the encounter to include what is described in the assessment and plan.   Delon FORBES Hope, NP  08/23/2022 10:53 AM

## 2024-08-22 LAB — METHYLMALONIC ACID, SERUM: Methylmalonic Acid, Quantitative: 162 nmol/L (ref 0–378)

## 2024-08-24 DIAGNOSIS — M170 Bilateral primary osteoarthritis of knee: Secondary | ICD-10-CM | POA: Diagnosis not present

## 2024-09-08 DIAGNOSIS — H5213 Myopia, bilateral: Secondary | ICD-10-CM | POA: Diagnosis not present

## 2024-11-06 ENCOUNTER — Telehealth: Payer: Self-pay

## 2024-11-06 NOTE — Telephone Encounter (Signed)
 Copied from CRM [id number]. Topic: Clinical - Medication Question >> Nov 06, 2024  9:40 AM Delon T wrote: Reason for CRM: Santina to ER yesterday and was prescribed Furosemide and clindamycin cream, patient is asking if Dr Glendia wants him to stay on the Furosemide or just the small does he was given- [phone]

## 2024-11-06 NOTE — Telephone Encounter (Signed)
 In reviewing the chart,, he was seen for blisters on his leg. Physicial noticed some swelling. I would recommend him take as they directed. Will need to be evaluated to determine if needs further medication. Please call him and confirm he is doing ok. Any other symptoms? Any sob, etc?

## 2024-11-13 ENCOUNTER — Ambulatory Visit

## 2024-11-13 VITALS — BP 118/70 | HR 55 | Temp 98.4°F | Ht 75.0 in | Wt 241.6 lb

## 2024-11-13 DIAGNOSIS — L97919 Non-pressure chronic ulcer of unspecified part of right lower leg with unspecified severity: Secondary | ICD-10-CM | POA: Insufficient documentation

## 2024-11-13 DIAGNOSIS — I739 Peripheral vascular disease, unspecified: Secondary | ICD-10-CM

## 2024-11-13 NOTE — Assessment & Plan Note (Addendum)
-  Patient with known history of PAD of bilateral lower extremities (s/p R  - Multiple irregular, lesions of right lower leg with granulation tissue without malodorous discharge.  Bilateral lower leg 1+ pitting edema.  Right leg warm to touch.  Sensation intact.  Given patient's history, exam findings suspect ulceration secondary to PVD.   - He is prescribed doxycycline  by his dermatologist, which he takes 100 mg once daily.  Recommend taking doxycycline  100 mg twice a day for neck 7 days then once daily after that to help reduce risk of infection.  - Previously saw vascular surgery.  Lives in [place] , does not like driving to [place].   - Urgent referral to vascular surgery for further evaluation done ([facility] in [place]).   - Also urgent referral made to [facility] wound care at [facility] for wound care urgently. - Advised gentle skin care, avoiding hot showers and soaking legs in hot water. - Instructed to seek emergency care if leg becomes purple, loses sensation, or becomes very painful. Orders:   Ambulatory referral to Vascular Surgery   Ambulatory referral to Wound Clinic

## 2024-11-13 NOTE — Assessment & Plan Note (Addendum)
 Plan per peripheral vascular disease of lower extremity with ulceration. Orders:   Ambulatory referral to Vascular Surgery   Ambulatory referral to Wound Clinic

## 2024-11-13 NOTE — Patient Instructions (Addendum)
 Use the antibiotic Doxycycline , prescribed to you by your dermatologist 100 mg twice daily for 7 days then go back to taking it once daily. I am referring you to vascular surgery ([facility]) and wound care at [facility]) urgently. If you do not hear from us  within this week please reach out to our clinic.    At the meantime:  Gentle skin care recommended:  Use lukewarm (not hot) water Use mild, fragrance-free body wash or soap Do not scrub with washcloths or sponges Dry carefully Gently pat skin dry with a soft towel Do not rub  If your leg gets purple, you cannot feel sensation or is really painful you should go to the ER.

## 2024-11-13 NOTE — Progress Notes (Signed)
 "  Acute/ED visit   Subjective  Patient ID: Charles Nichols, male    DOB: August 17, 1951  Age: 74 y.o. MRN: [id number]  Chief Complaint  Patient presents with   Wound Check    Discussed the use of AI scribe software for clinical note transcription with the patient, who gave verbal consent to proceed.  History of Present Illness Charles Nichols is a 74 year old male with peripheral vascular disease who presents with right lower leg ulcer/ED follow up.  Seen at [facility] ED on 11/05/2024 for right lower leg wound associated with peripheral edema. He was recommended to apply clindamycin gel for 5 to 7 days, Lasix for 3 days to help with edema and leg elevation was recommended.   The leg ulcer began approximately two weeks ago, initially as a blister that burst and released fluid. He managed it by cleaning and covering with sterile gauze.  He denies any recent injury, cut, or wound that could have initiated the ulcer. He recalls a similar skin issue occurring a few years ago, which resolved on its own.  He has a history of bilateral lower leg atherosclerotic disease with intermittent claudication.  Has not been following up with vascular surgery.    He is currently taking doxycycline  100 mg daily, prescribed by his dermatologist, and has recently completed a course of clindamycin lotion. He notes that the swelling has reduced compared to when the ulcer first appeared.  No fever, chills, or rash elsewhere on his body. The ulcer is not currently painful or burning, but it causes discomfort when walking.  He quit smoking in 1997.  He lives in [place], [place] , and prefers receiving care closer to home due to travel constraints.    ROS As per HPI    Objective:     BP 118/70 (BP Location: Right Arm, Patient Position: Sitting)   Pulse (!) 55   Temp 98.4 F (36.9 C) (Oral)   Ht 6' 3 (1.905 m)   Wt 241 lb 9.6 oz (109.6 kg)   SpO2 96%   BMI 30.20 kg/m      11/13/2024   10:57 AM 08/21/2024   10:21 AM  07/28/2024    8:57 AM  Depression screen PHQ 2/9  Decreased Interest 0 0 0  Down, Depressed, Hopeless 0 0 0  PHQ - 2 Score 0 0 0  Altered sleeping 0  0  Tired, decreased energy 0  0  Change in appetite 0  0  Feeling bad or failure about yourself  0  0  Trouble concentrating 0  0  Moving slowly or fidgety/restless 0  0  Suicidal thoughts 0  0  PHQ-9 Score 0  0   Difficult doing work/chores Not difficult at all  Not difficult at all     Data saved with a previous flowsheet row definition      11/13/2024   10:57 AM 07/28/2024    8:57 AM 12/20/2022    2:35 PM  GAD 7 : Generalized Anxiety Score  Nervous, Anxious, on Edge 0 0  0   Control/stop worrying 0 0  0   Worry too much - different things 0 0  0   Trouble relaxing 0 0  0   Restless 0 0  0   Easily annoyed or irritable 0 0  0   Afraid - awful might happen 0 0  0   Total GAD 7 Score 0 0 0  Anxiety Difficulty Not difficult at all Not  difficult at all Not difficult at all     Data saved with a previous flowsheet row definition      11/13/2024   10:57 AM 08/21/2024   10:21 AM 07/28/2024    8:57 AM  Depression screen PHQ 2/9  Decreased Interest 0 0 0  Down, Depressed, Hopeless 0 0 0  PHQ - 2 Score 0 0 0  Altered sleeping 0  0  Tired, decreased energy 0  0  Change in appetite 0  0  Feeling bad or failure about yourself  0  0  Trouble concentrating 0  0  Moving slowly or fidgety/restless 0  0  Suicidal thoughts 0  0  PHQ-9 Score 0  0   Difficult doing work/chores Not difficult at all  Not difficult at all     Data saved with a previous flowsheet row definition      11/13/2024   10:57 AM 07/28/2024    8:57 AM 12/20/2022    2:35 PM  GAD 7 : Generalized Anxiety Score  Nervous, Anxious, on Edge 0 0  0   Control/stop worrying 0 0  0   Worry too much - different things 0 0  0   Trouble relaxing 0 0  0   Restless 0 0  0   Easily annoyed or irritable 0 0  0   Afraid - awful might happen 0 0  0   Total GAD 7 Score 0 0 0   Anxiety Difficulty Not difficult at all Not difficult at all Not difficult at all     Data saved with a previous flowsheet row definition   SDOH Screenings   Food Insecurity: No Food Insecurity (04/15/2024)  Housing: Unknown (04/15/2024)  Transportation Needs: No Transportation Needs (04/15/2024)  Utilities: Not At Risk (04/15/2024)  Alcohol Screen: Low Risk (04/15/2024)  Depression (PHQ2-9): Low Risk (11/13/2024)  Financial Resource Strain: Low Risk (04/15/2024)  Physical Activity: Insufficiently Active (04/15/2024)  Social Connections: Moderately Isolated (04/15/2024)  Stress: No Stress Concern Present (04/15/2024)  Tobacco Use: Medium Risk (11/13/2024)  Health Literacy: Adequate Health Literacy (04/15/2024)     Physical Exam Constitutional:      General: He is not in acute distress. HENT:     Head: Normocephalic.     Mouth/Throat:     Mouth: Mucous membranes are moist.  Cardiovascular:     Rate and Rhythm: Normal rate.  Pulmonary:     Effort: Pulmonary effort is normal.     Breath sounds: Normal breath sounds.  Abdominal:     Tenderness: There is no abdominal tenderness.  Musculoskeletal:     Cervical back: Neck supple.     Right lower leg: Edema (1+pitting edema) present.     Left lower leg: Edema (1+pitting edema) present.  Skin:    Findings: Lesion (Multiple oval-shaped, erythematous inflamed lesions involving right lower leg.) present.  Neurological:     Mental Status: He is alert and oriented to person, place, and time.             No results found for any visits on 11/13/24.  The ASCVD Risk score (Arnett DK, et al., 2019) failed to calculate for the following reasons:   The valid total cholesterol range is 130 to 320 mg/dL     Assessment & Plan:   Assessment & Plan Peripheral vascular disease of lower extremity with ulceration (HCC) -Patient with known history of PAD of bilateral lower extremities (s/p R  - Multiple irregular, lesions of right lower  leg with  granulation tissue without malodorous discharge.  Bilateral lower leg 1+ pitting edema.  Right leg warm to touch.  Sensation intact.  Given patient's history, exam findings suspect ulceration secondary to PVD.   - He is prescribed doxycycline  by his dermatologist, which he takes 100 mg once daily.  Recommend taking doxycycline  100 mg twice a day for neck 7 days then once daily after that to help reduce risk of infection.  - Previously saw vascular surgery.  Lives in [place] , does not like driving to [place].   - Urgent referral to vascular surgery for further evaluation done ([facility] in [place]).   - Also urgent referral made to [facility] wound care at [facility] for wound care urgently. - Advised gentle skin care, avoiding hot showers and soaking legs in hot water. - Instructed to seek emergency care if leg becomes purple, loses sensation, or becomes very painful. Orders:   Ambulatory referral to Vascular Surgery   Ambulatory referral to Wound Clinic  Peripheral vascular disease Plan per peripheral vascular disease of lower extremity with ulceration. Orders:   Ambulatory referral to Vascular Surgery   Ambulatory referral to Wound Clinic   Return in about 4 weeks (around 12/11/2024) for Follow up with PCP Dr. Glendia.   Charles Shade, MD "

## 2024-12-11 ENCOUNTER — Ambulatory Visit: Admitting: Internal Medicine

## 2025-01-28 ENCOUNTER — Encounter: Admitting: Internal Medicine

## 2025-04-21 ENCOUNTER — Ambulatory Visit

## 2025-08-03 ENCOUNTER — Ambulatory Visit: Admitting: Internal Medicine

## 2025-08-13 ENCOUNTER — Inpatient Hospital Stay

## 2025-08-20 ENCOUNTER — Inpatient Hospital Stay: Admitting: Oncology
